# Patient Record
Sex: Female | Born: 1980 | Race: White | Hispanic: No | Marital: Married | Smoking: Former smoker
Health system: Southern US, Community
[De-identification: ages and names within clinical notes are randomized; demographics above are authoritative.]

## PROBLEM LIST (undated history)

## (undated) DIAGNOSIS — Z1371 Encounter for nonprocreative screening for genetic disease carrier status: Secondary | ICD-10-CM

## (undated) DIAGNOSIS — F32A Depression, unspecified: Secondary | ICD-10-CM

## (undated) DIAGNOSIS — D649 Anemia, unspecified: Secondary | ICD-10-CM

## (undated) DIAGNOSIS — K219 Gastro-esophageal reflux disease without esophagitis: Secondary | ICD-10-CM

## (undated) DIAGNOSIS — Z1379 Encounter for other screening for genetic and chromosomal anomalies: Secondary | ICD-10-CM

## (undated) DIAGNOSIS — I10 Essential (primary) hypertension: Secondary | ICD-10-CM

## (undated) DIAGNOSIS — N920 Excessive and frequent menstruation with regular cycle: Secondary | ICD-10-CM

## (undated) DIAGNOSIS — Z8041 Family history of malignant neoplasm of ovary: Secondary | ICD-10-CM

## (undated) DIAGNOSIS — R519 Headache, unspecified: Secondary | ICD-10-CM

## (undated) HISTORY — DX: Encounter for other screening for genetic and chromosomal anomalies: Z13.79

## (undated) HISTORY — DX: Encounter for nonprocreative screening for genetic disease carrier status: Z13.71

## (undated) HISTORY — PX: ABDOMINAL HYSTERECTOMY: SHX81

## (undated) HISTORY — DX: Gastro-esophageal reflux disease without esophagitis: K21.9

## (undated) HISTORY — PX: BREAST SURGERY: SHX581

## (undated) HISTORY — DX: Essential (primary) hypertension: I10

## (undated) HISTORY — DX: Family history of malignant neoplasm of ovary: Z80.41

## (undated) HISTORY — PX: ABDOMINOPLASTY: SUR9

## (undated) HISTORY — DX: Excessive and frequent menstruation with regular cycle: N92.0

---

## 2005-03-03 HISTORY — PX: AUGMENTATION MAMMAPLASTY: SUR837

## 2005-03-03 HISTORY — PX: ABDOMINOPLASTY: SUR9

## 2013-07-11 DIAGNOSIS — R002 Palpitations: Secondary | ICD-10-CM | POA: Insufficient documentation

## 2013-07-11 DIAGNOSIS — O1490 Unspecified pre-eclampsia, unspecified trimester: Secondary | ICD-10-CM | POA: Insufficient documentation

## 2013-07-11 DIAGNOSIS — I10 Essential (primary) hypertension: Secondary | ICD-10-CM | POA: Insufficient documentation

## 2014-08-22 DIAGNOSIS — D509 Iron deficiency anemia, unspecified: Secondary | ICD-10-CM | POA: Insufficient documentation

## 2014-10-02 DIAGNOSIS — Z1371 Encounter for nonprocreative screening for genetic disease carrier status: Secondary | ICD-10-CM

## 2014-10-02 DIAGNOSIS — Z1379 Encounter for other screening for genetic and chromosomal anomalies: Secondary | ICD-10-CM

## 2014-10-02 HISTORY — DX: Encounter for nonprocreative screening for genetic disease carrier status: Z13.71

## 2014-10-02 HISTORY — DX: Encounter for other screening for genetic and chromosomal anomalies: Z13.79

## 2015-01-03 ENCOUNTER — Encounter
Admission: RE | Admit: 2015-01-03 | Discharge: 2015-01-03 | Disposition: A | Discharge status: Home / Self Care | Payer: 59 | Source: Ambulatory Visit | Attending: Obstetrics and Gynecology | Admitting: Obstetrics and Gynecology

## 2015-01-03 DIAGNOSIS — Z01818 Encounter for other preprocedural examination: Secondary | ICD-10-CM | POA: Diagnosis not present

## 2015-01-03 DIAGNOSIS — N920 Excessive and frequent menstruation with regular cycle: Secondary | ICD-10-CM | POA: Diagnosis not present

## 2015-01-03 DIAGNOSIS — N946 Dysmenorrhea, unspecified: Secondary | ICD-10-CM | POA: Insufficient documentation

## 2015-01-03 HISTORY — DX: Anemia, unspecified: D64.9

## 2015-01-03 LAB — BASIC METABOLIC PANEL
Anion gap: 7 (ref 5–15)
BUN: 11 mg/dL (ref 6–20)
CO2: 27 mmol/L (ref 22–32)
Calcium: 9 mg/dL (ref 8.9–10.3)
Chloride: 103 mmol/L (ref 101–111)
Creatinine, Ser: 0.67 mg/dL (ref 0.44–1.00)
GFR calc Af Amer: >60 mL/min (ref >60)
GFR calc non Af Amer: >60 mL/min (ref >60)
Glucose, Bld: 81 mg/dL (ref 65–99)
Potassium: 4.3 mmol/L (ref 3.5–5.1)
Sodium: 137 mmol/L (ref 135–145)

## 2015-01-03 LAB — CBC
HCT: 36 % (ref 35.0–47.0)
Hemoglobin: 11.9 g/dL — ABNORMAL LOW (ref 12.0–16.0)
MCH: 30.7 pg (ref 26.0–34.0)
MCHC: 33.2 g/dL (ref 32.0–36.0)
MCV: 92.5 fL (ref 80.0–100.0)
Platelets: 236 10*3/uL (ref 150–440)
RBC: 3.89 MIL/uL (ref 3.80–5.20)
RDW: 16.3 % — ABNORMAL HIGH (ref 11.5–14.5)
WBC: 5.7 10*3/uL (ref 3.6–11.0)

## 2015-01-03 LAB — TYPE AND SCREEN
ABO/RH(D): O NEG
Antibody Screen: NEGATIVE

## 2015-01-03 LAB — ABO/RH: ABO/RH(D): O NEG

## 2015-01-03 NOTE — Patient Instructions (Addendum)
  Your procedure is scheduled on:01/11/15 Report to Day Surgery.[facility]OND FLOOR To find out your arrival time please call [phone] between 1PM - 3PM on 01/10/15.  Remember: Instructions that are not followed completely may result in serious medical risk, up to and including death, or upon the discretion of your surgeon and anesthesiologist your surgery may need to be rescheduled.    _X___ 1. Do not eat food or drink liquids after midnight. No gum chewing or hard candies.     _X___ 2. No Alcohol for 24 hours before or after surgery.   ____ 3. Bring all medications with you on the day of surgery if instructed.    __X__ 4. Notify your doctor if there is any change in your medical condition     (cold, fever, infections).     Do not wear jewelry, make-up, hairpins, clips or nail polish.  Do not wear lotions, powders, or perfumes. You may wear deodorant.  Do not shave 48 hours prior to surgery. Men may shave face and neck.  Do not bring valuables to the hospital.    [facility] is not responsible for any belongings or valuables.               Contacts, dentures or bridgework may not be worn into surgery.  Leave your suitcase in the car. After surgery it may be brought to your room.  For patients admitted to the hospital, discharge time is determined by your                treatment team.   Patients discharged the day of surgery will not be allowed to drive home.   Please read over the following fact sheets that you were given:   Surgical Site Infection Prevention   ____ Take these medicines the morning of surgery with A SIP OF WATER:    1. ZANTAC  2.   3.   4.  5.  6.  ____ Fleet Enema (as directed)   _X___ Use CHG Soap as directed  ____ Use inhalers on the day of surgery  ____ Stop metformin 2 days prior to surgery    ____ Take 1/2 of usual insulin dose the night before surgery and none on the morning of surgery.   ____ Stop Coumadin/Plavix/aspirin on  ____  Stop Anti-inflammatories on    ____ Stop supplements until after surgery.    ____ Bring C-Pap to the hospital.

## 2015-01-11 ENCOUNTER — Observation Stay
Admission: RE | Admit: 2015-01-11 | Discharge: 2015-01-12 | Disposition: A | Discharge status: Home / Self Care | Payer: 59 | Source: Ambulatory Visit | Attending: Obstetrics and Gynecology | Admitting: Obstetrics and Gynecology

## 2015-01-11 ENCOUNTER — Ambulatory Visit: Payer: 59 | Admitting: Certified Registered Nurse Anesthetist

## 2015-01-11 ENCOUNTER — Encounter
Admission: RE | Disposition: A | Discharge status: Home / Self Care | Payer: Self-pay | Source: Ambulatory Visit | Attending: Obstetrics and Gynecology

## 2015-01-11 ENCOUNTER — Encounter: Payer: Self-pay | Admitting: *Deleted

## 2015-01-11 DIAGNOSIS — D649 Anemia, unspecified: Secondary | ICD-10-CM | POA: Insufficient documentation

## 2015-01-11 DIAGNOSIS — Z808 Family history of malignant neoplasm of other organs or systems: Secondary | ICD-10-CM | POA: Insufficient documentation

## 2015-01-11 DIAGNOSIS — N920 Excessive and frequent menstruation with regular cycle: Principal | ICD-10-CM | POA: Insufficient documentation

## 2015-01-11 DIAGNOSIS — N946 Dysmenorrhea, unspecified: Secondary | ICD-10-CM | POA: Diagnosis not present

## 2015-01-11 DIAGNOSIS — Z87891 Personal history of nicotine dependence: Secondary | ICD-10-CM | POA: Insufficient documentation

## 2015-01-11 DIAGNOSIS — K219 Gastro-esophageal reflux disease without esophagitis: Secondary | ICD-10-CM | POA: Insufficient documentation

## 2015-01-11 DIAGNOSIS — Z801 Family history of malignant neoplasm of trachea, bronchus and lung: Secondary | ICD-10-CM | POA: Insufficient documentation

## 2015-01-11 DIAGNOSIS — K660 Peritoneal adhesions (postprocedural) (postinfection): Secondary | ICD-10-CM | POA: Insufficient documentation

## 2015-01-11 DIAGNOSIS — N838 Other noninflammatory disorders of ovary, fallopian tube and broad ligament: Secondary | ICD-10-CM | POA: Diagnosis not present

## 2015-01-11 DIAGNOSIS — Z90710 Acquired absence of both cervix and uterus: Secondary | ICD-10-CM | POA: Diagnosis present

## 2015-01-11 DIAGNOSIS — Z8041 Family history of malignant neoplasm of ovary: Secondary | ICD-10-CM | POA: Diagnosis not present

## 2015-01-11 DIAGNOSIS — N888 Other specified noninflammatory disorders of cervix uteri: Secondary | ICD-10-CM | POA: Diagnosis not present

## 2015-01-11 DIAGNOSIS — Z803 Family history of malignant neoplasm of breast: Secondary | ICD-10-CM | POA: Insufficient documentation

## 2015-01-11 DIAGNOSIS — Z79899 Other long term (current) drug therapy: Secondary | ICD-10-CM | POA: Insufficient documentation

## 2015-01-11 HISTORY — PX: LAPAROSCOPIC HYSTERECTOMY: SHX1926

## 2015-01-11 HISTORY — PX: LAPAROSCOPIC BILATERAL SALPINGECTOMY: SHX5889

## 2015-01-11 HISTORY — PX: CYSTOSCOPY: SHX5120

## 2015-01-11 LAB — POCT PREGNANCY, URINE: Preg Test, Ur: NEGATIVE

## 2015-01-11 SURGERY — HYSTERECTOMY, TOTAL, LAPAROSCOPIC
Anesthesia: General

## 2015-01-11 MED ORDER — LACTATED RINGERS IV SOLN
INTRAVENOUS | Status: DC
  Administered 2015-01-11 (×2): via INTRAVENOUS

## 2015-01-11 MED ORDER — NEOSTIGMINE METHYLSULFATE 10 MG/10ML IV SOLN
INTRAVENOUS | Status: DC | PRN
  Administered 2015-01-11: 3 mg via INTRAVENOUS

## 2015-01-11 MED ORDER — SODIUM CHLORIDE 0.9 % IJ SOLN: 9.0000 mL | INTRAMUSCULAR | Status: DC | PRN

## 2015-01-11 MED ORDER — HYDROMORPHONE HCL 1 MG/ML IJ SOLN
0.2500 mg | INTRAMUSCULAR | Status: DC | PRN
  Administered 2015-01-11 (×2): 0.5 mg via INTRAVENOUS

## 2015-01-11 MED ORDER — DEXAMETHASONE SODIUM PHOSPHATE 4 MG/ML IJ SOLN
INTRAMUSCULAR | Status: DC | PRN
  Administered 2015-01-11: 5 mg via INTRAVENOUS

## 2015-01-11 MED ORDER — MIDAZOLAM HCL 2 MG/2ML IJ SOLN
INTRAMUSCULAR | Status: DC | PRN
  Administered 2015-01-11: 2 mg via INTRAVENOUS

## 2015-01-11 MED ORDER — PANTOPRAZOLE SODIUM 40 MG IV SOLR
40.0000 mg | Freq: Every day | INTRAVENOUS | Status: DC
  Administered 2015-01-11: 40 mg via INTRAVENOUS
  Filled 2015-01-11 (×2): qty 40

## 2015-01-11 MED ORDER — KETOROLAC TROMETHAMINE 30 MG/ML IJ SOLN
30.0000 mg | Freq: Four times a day (QID) | INTRAMUSCULAR | Status: DC
Start: 2015-01-11 — End: 2015-01-12
  Administered 2015-01-11 – 2015-01-12 (×3): 30 mg via INTRAVENOUS
  Filled 2015-01-11 (×3): qty 1

## 2015-01-11 MED ORDER — GLYCOPYRROLATE 0.2 MG/ML IJ SOLN
INTRAMUSCULAR | Status: DC | PRN
  Administered 2015-01-11: 0.4 mg via INTRAVENOUS

## 2015-01-11 MED ORDER — LIDOCAINE HCL (CARDIAC) 20 MG/ML IV SOLN
INTRAVENOUS | Status: DC | PRN
  Administered 2015-01-11: 100 mg via INTRAVENOUS

## 2015-01-11 MED ORDER — ONDANSETRON HCL 4 MG/2ML IJ SOLN
INTRAMUSCULAR | Status: DC | PRN
  Administered 2015-01-11: 4 mg via INTRAVENOUS

## 2015-01-11 MED ORDER — ONDANSETRON HCL 4 MG/2ML IJ SOLN
4.0000 mg | Freq: Four times a day (QID) | INTRAMUSCULAR | Status: DC | PRN
  Administered 2015-01-11 – 2015-01-12 (×2): 4 mg via INTRAVENOUS
  Filled 2015-01-11 (×3): qty 2

## 2015-01-11 MED ORDER — CEFAZOLIN SODIUM-DEXTROSE 2-3 GM-% IV SOLR
INTRAVENOUS | Status: AC
  Administered 2015-01-11: 2 g via INTRAVENOUS
  Filled 2015-01-11: qty 50

## 2015-01-11 MED ORDER — PROPOFOL 10 MG/ML IV BOLUS
INTRAVENOUS | Status: DC | PRN
  Administered 2015-01-11: 150 mg via INTRAVENOUS

## 2015-01-11 MED ORDER — ROCURONIUM BROMIDE 100 MG/10ML IV SOLN
INTRAVENOUS | Status: DC | PRN
  Administered 2015-01-11: 5 mg via INTRAVENOUS
  Administered 2015-01-11 (×3): 10 mg via INTRAVENOUS

## 2015-01-11 MED ORDER — KETOROLAC TROMETHAMINE 30 MG/ML IJ SOLN
30.0000 mg | Freq: Four times a day (QID) | INTRAMUSCULAR | Status: DC
Start: 2015-01-11 — End: 2015-01-12

## 2015-01-11 MED ORDER — DIPHENHYDRAMINE HCL 12.5 MG/5ML PO ELIX: 12.5000 mg | ORAL_SOLUTION | Freq: Four times a day (QID) | ORAL | Status: DC | PRN

## 2015-01-11 MED ORDER — FENTANYL CITRATE (PF) 100 MCG/2ML IJ SOLN
INTRAMUSCULAR | Status: AC
  Administered 2015-01-11: 25 ug via INTRAVENOUS
  Filled 2015-01-11: qty 2

## 2015-01-11 MED ORDER — BUPIVACAINE HCL (PF) 0.5 % IJ SOLN
INTRAMUSCULAR | Status: AC
  Filled 2015-01-11: qty 30

## 2015-01-11 MED ORDER — BUPIVACAINE HCL 0.5 % IJ SOLN
INTRAMUSCULAR | Status: DC | PRN
  Administered 2015-01-11: 21 mL

## 2015-01-11 MED ORDER — FENTANYL CITRATE (PF) 100 MCG/2ML IJ SOLN
25.0000 ug | INTRAMUSCULAR | Status: DC | PRN
  Administered 2015-01-11 (×4): 25 ug via INTRAVENOUS

## 2015-01-11 MED ORDER — MENTHOL 3 MG MT LOZG
1.0000 | LOZENGE | OROMUCOSAL | Status: DC | PRN
  Filled 2015-01-11: qty 9

## 2015-01-11 MED ORDER — HYDROMORPHONE HCL 1 MG/ML IJ SOLN
INTRAMUSCULAR | Status: AC
  Administered 2015-01-11: 0.5 mg via INTRAVENOUS
  Filled 2015-01-11: qty 1

## 2015-01-11 MED ORDER — LABETALOL HCL 5 MG/ML IV SOLN
INTRAVENOUS | Status: DC | PRN
  Administered 2015-01-11: 5 mg via INTRAVENOUS

## 2015-01-11 MED ORDER — OXYCODONE HCL 5 MG PO TABS: 5.0000 mg | ORAL_TABLET | Freq: Once | ORAL | Status: DC | PRN

## 2015-01-11 MED ORDER — DEXTROSE-NACL 5-0.45 % IV SOLN
INTRAVENOUS | Status: DC
  Administered 2015-01-11 – 2015-01-12 (×2): via INTRAVENOUS

## 2015-01-11 MED ORDER — NALOXONE HCL 0.4 MG/ML IJ SOLN: 0.4000 mg | INTRAMUSCULAR | Status: DC | PRN

## 2015-01-11 MED ORDER — CEFAZOLIN SODIUM-DEXTROSE 2-3 GM-% IV SOLR
2.0000 g | INTRAVENOUS | Status: AC
  Administered 2015-01-11: 2 g via INTRAVENOUS

## 2015-01-11 MED ORDER — ONDANSETRON HCL 4 MG PO TABS: 4.0000 mg | ORAL_TABLET | Freq: Four times a day (QID) | ORAL | Status: DC | PRN

## 2015-01-11 MED ORDER — ACETAMINOPHEN 10 MG/ML IV SOLN
INTRAVENOUS | Status: AC
  Filled 2015-01-11: qty 100

## 2015-01-11 MED ORDER — SIMETHICONE 80 MG PO CHEW: 80.0000 mg | CHEWABLE_TABLET | Freq: Four times a day (QID) | ORAL | Status: DC | PRN

## 2015-01-11 MED ORDER — PROMETHAZINE HCL 25 MG/ML IJ SOLN
12.5000 mg | Freq: Four times a day (QID) | INTRAMUSCULAR | Status: DC | PRN
  Administered 2015-01-11: 12.5 mg via INTRAMUSCULAR
  Filled 2015-01-11: qty 1

## 2015-01-11 MED ORDER — MORPHINE SULFATE 2 MG/ML IV SOLN
INTRAVENOUS | Status: DC
  Administered 2015-01-11: 16:00:00 via INTRAVENOUS
  Administered 2015-01-11 – 2015-01-12 (×3): 2 mg via INTRAVENOUS
  Filled 2015-01-11: qty 25

## 2015-01-11 MED ORDER — OXYCODONE HCL 5 MG/5ML PO SOLN: 5.0000 mg | Freq: Once | ORAL | Status: DC | PRN

## 2015-01-11 MED ORDER — SUCCINYLCHOLINE CHLORIDE 20 MG/ML IJ SOLN
INTRAMUSCULAR | Status: DC | PRN
  Administered 2015-01-11: 120 mg via INTRAVENOUS

## 2015-01-11 MED ORDER — FENTANYL CITRATE (PF) 100 MCG/2ML IJ SOLN
INTRAMUSCULAR | Status: DC | PRN
  Administered 2015-01-11: 100 ug via INTRAVENOUS
  Administered 2015-01-11 (×5): 50 ug via INTRAVENOUS

## 2015-01-11 MED ORDER — ACETAMINOPHEN 10 MG/ML IV SOLN
INTRAVENOUS | Status: DC | PRN
Start: 2015-01-11 — End: 2015-01-11
  Administered 2015-01-11: 1000 mg via INTRAVENOUS

## 2015-01-11 MED ORDER — DIPHENHYDRAMINE HCL 50 MG/ML IJ SOLN: 12.5000 mg | Freq: Four times a day (QID) | INTRAMUSCULAR | Status: DC | PRN

## 2015-01-11 SURGICAL SUPPLY — 42 items
BAG URO DRAIN 2000ML W/SPOUT (MISCELLANEOUS) ×5 IMPLANT
BLADE SURG SZ11 CARB STEEL (BLADE) ×5 IMPLANT
CANISTER SUCT 1200ML W/VALVE (MISCELLANEOUS) ×5 IMPLANT
CATH FOLEY 2WAY  5CC 16FR (CATHETERS) ×2
CATH URTH 16FR FL 2W BLN LF (CATHETERS) ×3 IMPLANT
CHLORAPREP W/TINT 26ML (MISCELLANEOUS) ×5 IMPLANT
COUNTER NEEDLE 20/40 LG (NEEDLE) ×5 IMPLANT
FILTER LAP SMOKE EVAC STRL (MISCELLANEOUS) IMPLANT
GLOVE BIO SURGEON STRL SZ7 (GLOVE) ×20 IMPLANT
GLOVE INDICATOR 7.5 STRL GRN (GLOVE) ×20 IMPLANT
GOWN STRL REUS W/ TWL LRG LVL3 (GOWN DISPOSABLE) ×6 IMPLANT
GOWN STRL REUS W/ TWL XL LVL3 (GOWN DISPOSABLE) ×3 IMPLANT
GOWN STRL REUS W/TWL LRG LVL3 (GOWN DISPOSABLE) ×4
GOWN STRL REUS W/TWL XL LVL3 (GOWN DISPOSABLE) ×2
IRRIGATION STRYKERFLOW (MISCELLANEOUS) ×3 IMPLANT
IRRIGATOR STRYKERFLOW (MISCELLANEOUS) ×5
IV LACTATED RINGERS 1000ML (IV SOLUTION) ×5 IMPLANT
IV NS 1000ML (IV SOLUTION) ×2
IV NS 1000ML BAXH (IV SOLUTION) ×3 IMPLANT
LABEL OR SOLS (LABEL) ×5 IMPLANT
LIQUID BAND (GAUZE/BANDAGES/DRESSINGS) ×5 IMPLANT
MANIPULATOR VCARE LG CRV RETR (MISCELLANEOUS) ×5 IMPLANT
MANIPULATOR VCARE STD CRV RETR (MISCELLANEOUS) IMPLANT
NS IRRIG 500ML POUR BTL (IV SOLUTION) ×5 IMPLANT
OCCLUDER COLPOPNEUMO (BALLOONS) ×5 IMPLANT
PACK GYN LAPAROSCOPIC (MISCELLANEOUS) ×5 IMPLANT
PAD OB MATERNITY 4.3X12.25 (PERSONAL CARE ITEMS) ×5 IMPLANT
PAD PREP 24X41 OB/GYN DISP (PERSONAL CARE ITEMS) ×5 IMPLANT
SET CYSTO W/LG BORE CLAMP LF (SET/KITS/TRAYS/PACK) ×5 IMPLANT
SHEARS HARMONIC ACE PLUS 36CM (ENDOMECHANICALS) ×5 IMPLANT
SLEEVE ENDOPATH XCEL 5M (ENDOMECHANICALS) ×10 IMPLANT
SPONGE LAP 18X18 5 PK (GAUZE/BANDAGES/DRESSINGS) ×5 IMPLANT
STRAP SAFETY BODY (MISCELLANEOUS) ×5 IMPLANT
SUT VIC AB 0 CT1 27 (SUTURE)
SUT VIC AB 0 CT1 27XCR 8 STRN (SUTURE) IMPLANT
SUT VIC AB 0 CT1 36 (SUTURE) ×15 IMPLANT
SUT VIC AB 2-0 UR6 27 (SUTURE) IMPLANT
SUT VIC AB 4-0 FS2 27 (SUTURE) IMPLANT
SYR 50ML LL SCALE MARK (SYRINGE) ×5 IMPLANT
SYRINGE 10CC LL (SYRINGE) ×5 IMPLANT
TROCAR XCEL NON-BLD 5MMX100MML (ENDOMECHANICALS) ×5 IMPLANT
TUBING INSUFFLATOR HEATED (MISCELLANEOUS) ×5 IMPLANT

## 2015-01-11 NOTE — Op Note (Signed)
Preoperative Diagnosis: 1) 34 y.o.  with dysmenorrhea and menorrhagia  Postoperative Diagnosis: 1) 34 y.o.  with dysmenorrhea and menorrhagia  Operation Performed: Total laparoscopic hysterectomy, bilateral salpingectomy, and lysis of adhesions  Indication: 34 y.o. with menorrhagia to anemia desiring definitive surgical management  Anesthesia: General  Preoperative Antibiotics: 2g ancef  Estimated Blood Loss: 30mL  IV Fluids: 86mL crystaloid  Urine Output:: 480mL  Drains or Tubes: foley to gravity drainage  Implants: none  Specimens Removed: uterus, cervix, and fallopian tubes  Complications: none  Intraoperative Findings: Normal tubes, ovaries, and uterus.  Small omental adhesion at umbilicus  Patient Condition: stable  Procedure in Detail:  Patient was taken to the operating room where she was administered general anesthesia.  She was positioned in the dorsal lithotomy position utilizing Allen stirups, prepped and draped in the usual sterile fashion.  Prior to proceeding with procedure a time out was performed.  Attention was turned to the patient's pelvis.  An indwelling foley catheter was used to empty the patient's bladder.  An operative speculum was placed to allow visualization of the cervix.  The anterior lip of the cervix was grasped with a single tooth tenaculum, uterus sounded to 9cm, anteverted, non-enlarged.  A V-care was placed to allow manipulation of the uterus.  The operative speculum and single tooth tenaculum were then removed.  Attention was turned to the patient's abdomen.  The umbilicus was infiltrated with 1% Sensorcaine, before making a stab incision using an 11 blade scalpel.  A 95mm Excel trocar was then used to gain direct entry into the peritoneal cavity utilizing the camera to visualize progress of the trocar during placement.  Once peritoneal entry had been achieved, insufflation was started and pneumoperitoneum established at a pressure of 61mmHg.    General inspection of the abdomen revealed the above noted findings, ureters were visualized bilaterally.  Two lateral lower quadrant 1mm Excel ports (right and left) were placed under direct visualization.  The right tube was identified and grasped at its fimbriated end then excised from it attachment to the ovary and mesosalpinx using a 54mm harmonic.  The round ligament and utero-ovarian ligaments were then transected using the harmonic.    The anterior leaf of the broad was dissected down to the level of the internal cervical os and a bladder flap was started.  The posterior leaf of the broad was taken down to the level of the right uterosacral ligament.  The uterine artery was skeletonize, transected while applying good cephalad pressure on the V-care device.  The same steps were then repeated on the patient's left.  The V-care was identified on the posterior aspect of the uterus and the colpotomy was started between the uterosacral ligaments then carried around counter clockwise until the specimen was freed of all attachments.  The uterus was removed vaginally.  The vaginal cuff was then closed using interupted 0 Vicryl figure of eight stitches.  The vaginal cuff was noted to be hemostatic at the end of the closure.  The pelvis was irrigated all pedicles were noted to be hemostatic.  Pneumoperitoneum was evacuated.  The trocars were removed.  The 46mm trocar site was closed with 4-0 Monocryl in a subcuticular fashion.  All trocar sites were then dressed with surgical skin glue.  Cystoscopy was performed with brisk efflux of urine from both UO's and an intact bladder dome.  The foley catheter was replaced.  Sponge needle and instrument counts were correct time two.  The patient tolerated the procedure well  and was taken to the recovery room in stable condition.

## 2015-01-11 NOTE — Anesthesia Postprocedure Evaluation (Signed)
  Anesthesia Post-op Note  Patient: Denise Bolton  Procedure(s) Performed: Procedure(s): HYSTERECTOMY TOTAL LAPAROSCOPIC (N/A) LAPAROSCOPIC BILATERAL SALPINGECTOMY (Bilateral) CYSTOSCOPY  Anesthesia type:General ETT  Patient location: PACU  Post pain: Pain level controlled  Post assessment: Post-op Vital signs reviewed, Patient's Cardiovascular Status Stable, Respiratory Function Stable, Patent Airway and No signs of Nausea or vomiting  Post vital signs: Reviewed and stable  Last Vitals:  Filed Vitals:   01/11/15 1517  BP: 150/88  Pulse: 56  Temp: 36.8 C  Resp: 18    Level of consciousness: awake, alert  and patient cooperative  Complications: No apparent anesthesia complications

## 2015-01-11 NOTE — H&P (Signed)
Date of Initial paper H&P: 01/03/2015  History reviewed, patient examined, no change in status, stable for surgery.

## 2015-01-11 NOTE — Transfer of Care (Signed)
Immediate Anesthesia Transfer of Care Note  Patient: Denise Bolton  Procedure(s) Performed: Procedure(s): HYSTERECTOMY TOTAL LAPAROSCOPIC (N/A) LAPAROSCOPIC BILATERAL SALPINGECTOMY (Bilateral) CYSTOSCOPY  Patient Location:  Pacu  Anesthesia Type:General  Level of Consciousness: awake and alert   Airway & Oxygen Therapy: Patient Spontanous Breathing and Patient connected to nasal cannula oxygen  Post-op Assessment: Report given to RN and Post -op Vital signs reviewed and stable  Post vital signs: Reviewed and stable  Last Vitals:  Filed Vitals:   01/11/15 0938  BP: 145/103  Pulse: 81  Temp: 36.6 C  Resp: 16    Complications: No apparent anesthesia complications

## 2015-01-11 NOTE — Progress Notes (Signed)
Talked to Dr. Georgianne Fick to report elevated BP since admission to the unit.  He stated he is still in the hospital and will be coming to see patient in a little while, and that she has had normal BPs in clinic so he is ok with her BP for now.  He is also going to speak to her about possibly taking down the PCA and going to oral pain meds as well as taking out the foley catheter.

## 2015-01-11 NOTE — Anesthesia Preprocedure Evaluation (Signed)
Anesthesia Evaluation  Patient identified by MRN, date of birth, ID band Patient awake    Reviewed: Allergy & Precautions, H&P , NPO status , Patient's Chart, lab work & pertinent test results  History of Anesthesia Complications Negative for: history of anesthetic complications  Airway Mallampati: II  TM Distance: >3 FB Neck ROM: full    Dental no notable dental hx. (+) Teeth Intact   Pulmonary neg shortness of breath, former smoker,    Pulmonary exam normal breath sounds clear to auscultation       Cardiovascular Exercise Tolerance: Good (-) angina(-) Past MI and (-) DOE negative cardio ROS Normal cardiovascular exam Rhythm:regular Rate:Normal     Neuro/Psych negative neurological ROS  negative psych ROS   GI/Hepatic Neg liver ROS, GERD  Medicated and Controlled,  Endo/Other  negative endocrine ROS  Renal/GU negative Renal ROS  negative genitourinary   Musculoskeletal   Abdominal   Peds  Hematology  (+) Blood dyscrasia, anemia ,   Anesthesia Other Findings Past Medical History:   Anemia                                                      Past Surgical History:   CESAREAN SECTION                                                Comment:x 2   ABDOMINOPLASTY                                                BREAST SURGERY                                                  Comment:augmentation  BMI    Body Mass Index   27.95 kg/m 2      Reproductive/Obstetrics negative OB ROS                             Anesthesia Physical Anesthesia Plan  ASA: III  Anesthesia Plan: General ETT   Post-op Pain Management:    Induction:   Airway Management Planned:   Additional Equipment:   Intra-op Plan:   Post-operative Plan:   Informed Consent: I have reviewed the patients History and Physical, chart, labs and discussed the procedure including the risks, benefits and alternatives for  the proposed anesthesia with the patient or authorized representative who has indicated his/her understanding and acceptance.   Dental Advisory Given  Plan Discussed with: Anesthesiologist, CRNA and Surgeon  Anesthesia Plan Comments:         Anesthesia Quick Evaluation

## 2015-01-11 NOTE — Progress Notes (Signed)
Obstetric and Gynecology  Subjective  Some nausea postoperatively, pain adequately controlled.  Objective   Filed Vitals:   01/11/15 1732  BP: 144/95  Pulse: 69  Temp: 98.1 F (36.7 C)  Resp: 14     Intake/Output Summary (Last 24 hours) at 01/11/15 1905 Last data filed at 01/11/15 1800  Gross per 24 hour  Intake   1050 ml  Output   1250 ml  Net   -200 ml    General: NAD Abdomen: soft, non-tender, non-distended, trocar incision D/C/I dressings Extremities: no edema, SCD's  Labs: Results for orders placed or performed during the hospital encounter of 01/11/15 (from the past 24 hour(s))  Pregnancy, urine POC     Status: None   Collection Time: 01/11/15  9:44 AM  Result Value Ref Range   Preg Test, Ur NEGATIVE NEGATIVE    Assessment   34 y.o. POD#0 TLH, BS, and cystoscopy  Plan    1) Continue routine postoperative care - Con't PCA and foley for now given nausea - prn zofran, will add prn IM phenergan for nausea - repeat CBC and BMP tomorrow AM - Discussed normal operative findings with patient

## 2015-01-11 NOTE — Anesthesia Procedure Notes (Signed)
Procedure Name: Intubation Date/Time: 01/11/2015 11:43 AM Performed by: Johnna Acosta Pre-anesthesia Checklist: Patient identified, Emergency Drugs available, Suction available and Patient being monitored Patient Re-evaluated:Patient Re-evaluated prior to inductionOxygen Delivery Method: Circle system utilized Preoxygenation: Pre-oxygenation with 100% oxygen Intubation Type: IV induction Ventilation: Mask ventilation without difficulty Laryngoscope Size: Miller and 2 Grade View: Grade I Tube type: Oral Tube size: 7.0 mm Number of attempts: 1 Placement Confirmation: ETT inserted through vocal cords under direct vision and positive ETCO2 Secured at: 21 cm Tube secured with: Tape Dental Injury: Teeth and Oropharynx as per pre-operative assessment

## 2015-01-12 ENCOUNTER — Encounter: Payer: Self-pay | Admitting: Obstetrics and Gynecology

## 2015-01-12 DIAGNOSIS — N920 Excessive and frequent menstruation with regular cycle: Secondary | ICD-10-CM | POA: Diagnosis not present

## 2015-01-12 LAB — CBC
HCT: 31.7 % — ABNORMAL LOW (ref 35.0–47.0)
Hemoglobin: 10.4 g/dL — ABNORMAL LOW (ref 12.0–16.0)
MCH: 30.2 pg (ref 26.0–34.0)
MCHC: 32.9 g/dL (ref 32.0–36.0)
MCV: 91.9 fL (ref 80.0–100.0)
Platelets: 243 10*3/uL (ref 150–440)
RBC: 3.45 MIL/uL — ABNORMAL LOW (ref 3.80–5.20)
RDW: 16.1 % — ABNORMAL HIGH (ref 11.5–14.5)
WBC: 7.2 10*3/uL (ref 3.6–11.0)

## 2015-01-12 LAB — BASIC METABOLIC PANEL
Anion gap: 5 (ref 5–15)
BUN: 7 mg/dL (ref 6–20)
CO2: 27 mmol/L (ref 22–32)
Calcium: 8.3 mg/dL — ABNORMAL LOW (ref 8.9–10.3)
Chloride: 107 mmol/L (ref 101–111)
Creatinine, Ser: 0.53 mg/dL (ref 0.44–1.00)
GFR calc Af Amer: >60 mL/min (ref >60)
GFR calc non Af Amer: >60 mL/min (ref >60)
Glucose, Bld: 113 mg/dL — ABNORMAL HIGH (ref 65–99)
Potassium: 3.9 mmol/L (ref 3.5–5.1)
Sodium: 139 mmol/L (ref 135–145)

## 2015-01-12 MED ORDER — IBUPROFEN 600 MG PO TABS
600.0000 mg | ORAL_TABLET | Freq: Four times a day (QID) | ORAL | Status: DC | PRN
Start: 2015-01-12 — End: 2015-01-12
  Administered 2015-01-12: 600 mg via ORAL
  Filled 2015-01-12: qty 1

## 2015-01-12 MED ORDER — DOCUSATE SODIUM 100 MG PO CAPS
100.0000 mg | ORAL_CAPSULE | Freq: Two times a day (BID) | ORAL | Status: DC
  Administered 2015-01-12: 100 mg via ORAL
  Filled 2015-01-12: qty 1

## 2015-01-12 MED ORDER — IBUPROFEN 600 MG PO TABS: 600.0000 mg | ORAL_TABLET | Freq: Four times a day (QID) | ORAL | Status: DC | PRN

## 2015-01-12 MED ORDER — OXYCODONE-ACETAMINOPHEN 5-325 MG PO TABS
1.0000 | ORAL_TABLET | Freq: Four times a day (QID) | ORAL | Status: DC | PRN
  Administered 2015-01-12: 2 via ORAL
  Filled 2015-01-12: qty 2

## 2015-01-12 MED ORDER — OXYCODONE-ACETAMINOPHEN 5-325 MG PO TABS: 1.0000 | ORAL_TABLET | Freq: Four times a day (QID) | ORAL | Status: DC | PRN

## 2015-01-12 MED ORDER — FAMOTIDINE 20 MG PO TABS
20.0000 mg | ORAL_TABLET | Freq: Every day | ORAL | Status: DC
  Administered 2015-01-12: 20 mg via ORAL
  Filled 2015-01-12: qty 1

## 2015-01-12 NOTE — Progress Notes (Signed)
All discharge instructions given to patient and she voices understanding of all instructions given. She will keep her already scheduled d/c appt. Iv d/c'd pt tolerated well, site benign. Prescriptions given.  Patient discharged home with spouse escorted out by auxillary

## 2015-01-12 NOTE — Discharge Instructions (Signed)
Follow up sooner fever greater than 100.5, problems breathing or pain not helped by medication, severe bleeding (saturating more than one pad an hour or large palm sized clots), or severe depression No driving while taking narcotics, No heavy lifting x 6 wks. Follow up sooner if you have any incisional concerns such as increased redness, swelling, discharge or increase in pain at incision site. No douches, intercourse , tampons, or enemas for 6 weeks.  °

## 2015-01-12 NOTE — Discharge Summary (Signed)
Physician Discharge Summary  Patient ID: Denise Bolton MRN: [id number] DOB/AGE: [birth year] 34 y.o.  Admit date: 01/11/2015 Discharge date: 01/12/2015  Admission Diagnoses: menorrhagia and dysmenorrhea  Discharge Diagnoses:  Active Problems:   S/P laparoscopic hysterectomy   Discharged Condition: good  Hospital Course: Patient underwent uncomplicated TLH, BS, and cystoscopy.  Some problems with nausea immediately postoperatively.  Improved overnight.  Patient meeting all postoperative goals including ambulating, tolerating general diet, voiding, and reporting adequate pain control on po analgesics.  Patient was afebrile and hemodynamically stable throughout admission.  Her postoperative labs revealed normal white count, stable H&H, and normal BUN/Cr without electrolyte abnormalities.  Consults: None  Significant Diagnostic Studies: Results for orders placed or performed during the hospital encounter of 01/11/15 (from the past 48 hour(s))  Pregnancy, urine POC     Status: None   Collection Time: 01/11/15  9:44 AM  Result Value Ref Range   Preg Test, Ur NEGATIVE NEGATIVE    Comment:        THE SENSITIVITY OF THIS METHODOLOGY IS >24 mIU/mL   Basic metabolic panel     Status: Abnormal   Collection Time: 01/12/15  6:26 AM  Result Value Ref Range   Sodium 139 135 - 145 mmol/L   Potassium 3.9 3.5 - 5.1 mmol/L   Chloride 107 101 - 111 mmol/L   CO2 27 22 - 32 mmol/L   Glucose, Bld 113 (H) 65 - 99 mg/dL   BUN 7 6 - 20 mg/dL   Creatinine, Ser 0.53 0.44 - 1.00 mg/dL   Calcium 8.3 (L) 8.9 - 10.3 mg/dL   GFR calc non Af Amer >60 >60 mL/min   GFR calc Af Amer >60 >60 mL/min    Comment: (NOTE) The eGFR has been calculated using the CKD EPI equation. This calculation has not been validated in all clinical situations. eGFR's persistently <60 mL/min signify possible Chronic Kidney Disease.    Anion gap 5 5 - 15  CBC     Status: Abnormal   Collection Time: 01/12/15  6:26 AM  Result  Value Ref Range   WBC 7.2 3.6 - 11.0 K/uL   RBC 3.45 (L) 3.80 - 5.20 MIL/uL   Hemoglobin 10.4 (L) 12.0 - 16.0 g/dL   HCT 31.7 (L) 35.0 - 47.0 %   MCV 91.9 80.0 - 100.0 fL   MCH 30.2 26.0 - 34.0 pg   MCHC 32.9 32.0 - 36.0 g/dL   RDW 16.1 (H) 11.5 - 14.5 %   Platelets 243 150 - 440 K/uL    Discharge Exam: Blood pressure 136/70, pulse 62, temperature 98.2 F (36.8 C), temperature source Oral, resp. rate 18, height _0  (1.651 m), weight 76.204 kg (168 lb), last menstrual period 01/08/2015, SpO2 99 %. General appearance: alert, appears stated age and no distress Resp: normal work of breathing GI: soft, non-tender; bowel sounds normal; no masses,  no organomegaly and incisions D/C/I  Extremities: extremities normal, atraumatic, no cyanosis or edema and SCD's in place  Disposition: Final discharge disposition not confirmed  Discharge Instructions    Activity as tolerated    Complete by:  As directed      Diet - low sodium heart healthy    Complete by:  As directed      Driving restriction     Complete by:  As directed   Avoid driving for at least 1 weeks or while taking narcotic pain medication.     Lifting restrictions    Complete by:  As  directed   Weight restriction of 10 lbs for 6 weeks     No dressing needed    Complete by:  As directed   No tampons, pads ok for any vaginal bleeding     Other restrictions (specify):    Complete by:  As directed   Showers ok no baths for first 6 weeks     Remove dressing in 24 hours    Complete by:  As directed      Sexual acrtivity    Complete by:  As directed   No intercourse for 6 weeks            Medication List    STOP taking these medications        ferrous fumarate 325 (106 FE) MG Tabs tablet  Commonly known as:  HEMOCYTE - 106 mg FE      TAKE these medications        buPROPion 300 MG 24 hr tablet  Commonly known as:  WELLBUTRIN XL  Take 300 mg by mouth daily.     ibuprofen 600 MG tablet  Commonly known as:   ADVIL,MOTRIN  Take 1 tablet (600 mg total) by mouth every 6 (six) hours as needed for fever or headache.     oxyCODONE-acetaminophen 5-325 MG tablet  Commonly known as:  PERCOCET/ROXICET  Take 1-2 tablets by mouth every 6 (six) hours as needed for moderate pain or severe pain.     ranitidine 75 MG tablet  Commonly known as:  ZANTAC  Take 75 mg by mouth every morning.           Follow-up Information    Follow up with Dorthula Nettles, MD In 1 week.   Specialty:  Obstetrics and Gynecology   Why:  incision check - [email or web address]   Contact information:   [address] [place] [address] [phone]       Signed: Dorthula Nettles 01/12/2015, 7:54 AM

## 2015-01-15 LAB — SURGICAL PATHOLOGY

## 2015-05-21 DIAGNOSIS — H5213 Myopia, bilateral: Secondary | ICD-10-CM | POA: Diagnosis not present

## 2015-12-10 ENCOUNTER — Encounter: Payer: Self-pay | Admitting: Physician Assistant

## 2015-12-10 ENCOUNTER — Ambulatory Visit: Payer: Self-pay | Admitting: Physician Assistant

## 2015-12-10 VITALS — BP 140/100 | HR 80 | Temp 98.3°F

## 2015-12-10 DIAGNOSIS — I10 Essential (primary) hypertension: Secondary | ICD-10-CM

## 2015-12-10 MED ORDER — HYDROCHLOROTHIAZIDE 25 MG PO TABS: 25.0000 mg | ORAL_TABLET | Freq: Every day | ORAL | 3 refills | Status: DC

## 2015-12-10 NOTE — Progress Notes (Signed)
S: c/o bp being elevated, several really high readings like 180/115, been running high for awhile in range of 130/90-100; is under a lot of stress, gained 20lbs this year, had partial hyst a few months ago, denies cp/sob/swelling in feet and ankles  O: vitals w elevated bp at 140/100L, 142/100R; other vitals wnl, nad, lungs c ta , cv rrr no mrg, no pedal edema noted  A: htn  P hctz 25mg , recheck bp at end of the week, f/u with DR Ronnald Ramp next Thursday for already scheduled appt

## 2015-12-14 ENCOUNTER — Ambulatory Visit: Payer: Self-pay | Admitting: Physician Assistant

## 2015-12-20 ENCOUNTER — Ambulatory Visit: Payer: Self-pay | Admitting: Family Medicine

## 2015-12-25 ENCOUNTER — Encounter: Payer: Self-pay | Admitting: Family Medicine

## 2015-12-25 ENCOUNTER — Ambulatory Visit (INDEPENDENT_AMBULATORY_CARE_PROVIDER_SITE_OTHER): Payer: 59 | Admitting: Family Medicine

## 2015-12-25 VITALS — BP 130/90 | HR 68 | Ht 65.0 in | Wt 187.0 lb

## 2015-12-25 DIAGNOSIS — Z7689 Persons encountering health services in other specified circumstances: Secondary | ICD-10-CM | POA: Diagnosis not present

## 2015-12-25 DIAGNOSIS — I10 Essential (primary) hypertension: Secondary | ICD-10-CM

## 2015-12-25 MED ORDER — HYDROCHLOROTHIAZIDE 25 MG PO TABS: 25.0000 mg | ORAL_TABLET | Freq: Every day | ORAL | 1 refills | Status: DC

## 2015-12-25 NOTE — Patient Instructions (Signed)

## 2015-12-25 NOTE — Progress Notes (Signed)
Name: Denise Bolton   MRN: [id number]    DOB: 04-Nov-1980   Date:12/25/2015       Progress Note  Subjective  Chief Complaint  Chief Complaint  Patient presents with  . Establish Care    needed a PCP/ Ins changed  . Hypertension    started HCTZ x 2 weeks ago- B/P was 190/115    Hypertension  This is a new problem. The current episode started more than 1 year ago. The problem has been waxing and waning since onset. The problem is controlled. Associated symptoms include chest pain. Pertinent negatives include no anxiety, blurred vision, headaches, malaise/fatigue, neck pain, orthopnea, palpitations, peripheral edema, PND, shortness of breath or sweats. There are no associated agents to hypertension. Past treatments include diuretics. The current treatment provides mild improvement. There are no compliance problems.  There is no history of angina, kidney disease, CAD/MI, CVA, heart failure, left ventricular hypertrophy, PVD, renovascular disease or retinopathy. There is no history of chronic renal disease or a hypertension causing med.    No problem-specific Assessment & Plan notes found for this encounter.   Past Medical History:  Diagnosis Date  . Anemia   . GERD (gastroesophageal reflux disease)   . Hypertension     Past Surgical History:  Procedure Laterality Date  . ABDOMINOPLASTY    . BREAST SURGERY     augmentation  . CESAREAN SECTION     x 2  . CYSTOSCOPY  01/11/2015   Procedure: CYSTOSCOPY;  Surgeon: Malachy Mood, MD;  Location: [facility] OR[facility];  Service: Gynecology;;  . LAPAROSCOPIC BILATERAL SALPINGECTOMY Bilateral 01/11/2015   Procedure: LAPAROSCOPIC BILATERAL SALPINGECTOMY;  Surgeon: Malachy Mood, MD;  Location: [facility];  Service: Gynecology;  Laterality: Bilateral;  . LAPAROSCOPIC HYSTERECTOMY N/A 01/11/2015   Procedure: HYSTERECTOMY TOTAL LAPAROSCOPIC;  Surgeon: Malachy Mood, MD;  Location: [facility];  Service: Gynecology;  Laterality: N/A;    Family  History  Problem Relation Age of Onset  . Cancer Mother   . Cancer Father     Social History   Social History  . Marital status: Married    Spouse name: N/A  . Number of children: N/A  . Years of education: N/A   Occupational History  . Not on file.   Social History Main Topics  . Smoking status: Former Smoker    Quit date: 01/02/2006  . Smokeless tobacco: Never Used  . Alcohol use Yes     Comment: occ.  . Drug use: No  . Sexual activity: Yes    Birth control/ protection: None   Other Topics Concern  . Not on file   Social History Narrative  . No narrative on file    No Known Allergies   Review of Systems  Constitutional: Negative for chills, fever, malaise/fatigue and weight loss.  HENT: Negative for ear discharge, ear pain and sore throat.   Eyes: Negative for blurred vision.  Respiratory: Negative for cough, sputum production, shortness of breath and wheezing.   Cardiovascular: Positive for chest pain. Negative for palpitations, orthopnea, leg swelling and PND.  Gastrointestinal: Negative for abdominal pain, blood in stool, constipation, diarrhea, heartburn, melena and nausea.  Genitourinary: Negative for dysuria, frequency, hematuria and urgency.  Musculoskeletal: Negative for back pain, joint pain, myalgias and neck pain.  Skin: Negative for rash.  Neurological: Negative for dizziness, tingling, sensory change, focal weakness and headaches.  Endo/Heme/Allergies: Negative for environmental allergies and polydipsia. Does not bruise/bleed easily.  Psychiatric/Behavioral: Negative for depression and suicidal ideas. The patient  is not nervous/anxious and does not have insomnia.      Objective  Vitals:   12/25/15 1419  BP: 130/90  Pulse: 68  Weight: 187 lb (84.8 kg)  Height: 5\' 5"  (1.651 m)    Physical Exam  Constitutional: She is well-developed, well-nourished, and in no distress. No distress.  HENT:  Head: Normocephalic and atraumatic.  Right Ear:  External ear normal.  Left Ear: External ear normal.  Nose: Nose normal.  Mouth/Throat: Oropharynx is clear and moist.  Eyes: Conjunctivae and EOM are normal. Pupils are equal, round, and reactive to light. Right eye exhibits no discharge. Left eye exhibits no discharge.  Neck: Normal range of motion. Neck supple. No JVD present. No thyromegaly present.  Cardiovascular: Normal rate, regular rhythm, normal heart sounds and intact distal pulses.  Exam reveals no gallop and no friction rub.   No murmur heard. Pulmonary/Chest: Effort normal and breath sounds normal.  Abdominal: Soft. Bowel sounds are normal. She exhibits no mass. There is no tenderness. There is no guarding.  Musculoskeletal: Normal range of motion. She exhibits no edema.  Lymphadenopathy:    She has no cervical adenopathy.  Neurological: She is alert.  Skin: Skin is warm and dry. She is not diaphoretic.  Psychiatric: Mood and affect normal.  Nursing note and vitals reviewed.     Assessment & Plan  Problem List Items Addressed This Visit    None    Visit Diagnoses    Encounter to establish care with new doctor    -  Primary   Essential hypertension       Relevant Medications   hydrochlorothiazide (HYDRODIURIL) 25 MG tablet   Other Relevant Orders   Renal Function Panel        Dr. Otilio Miu [facility]  12/25/15

## 2015-12-26 LAB — RENAL FUNCTION PANEL
Albumin: 4.3 g/dL (ref 3.5–5.5)
BUN/Creatinine Ratio: 20 (ref 9–23)
BUN: 15 mg/dL (ref 6–20)
CO2: 22 mmol/L (ref 18–29)
Calcium: 9.7 mg/dL (ref 8.7–10.2)
Chloride: 98 mmol/L (ref 96–106)
Creatinine, Ser: 0.74 mg/dL (ref 0.57–1.00)
GFR calc Af Amer: 120 mL/min/{1.73_m2} (ref >59)
GFR calc non Af Amer: 104 mL/min/{1.73_m2} (ref >59)
Glucose: 84 mg/dL (ref 65–99)
Phosphorus: 4 mg/dL (ref 2.5–4.5)
Potassium: 4.2 mmol/L (ref 3.5–5.2)
Sodium: 141 mmol/L (ref 134–144)

## 2016-01-15 ENCOUNTER — Ambulatory Visit (INDEPENDENT_AMBULATORY_CARE_PROVIDER_SITE_OTHER): Payer: 59

## 2016-01-15 DIAGNOSIS — Z111 Encounter for screening for respiratory tuberculosis: Secondary | ICD-10-CM | POA: Diagnosis not present

## 2016-01-17 ENCOUNTER — Other Ambulatory Visit: Payer: Self-pay

## 2016-01-17 LAB — TB SKIN TEST
Induration: 0 mm
TB Skin Test: NEGATIVE

## 2016-03-27 ENCOUNTER — Ambulatory Visit: Payer: 59 | Admitting: Family Medicine

## 2016-05-15 ENCOUNTER — Encounter: Payer: Self-pay | Admitting: Family Medicine

## 2016-05-15 ENCOUNTER — Ambulatory Visit (INDEPENDENT_AMBULATORY_CARE_PROVIDER_SITE_OTHER): Payer: 59 | Admitting: Family Medicine

## 2016-05-15 VITALS — BP 118/80 | HR 60 | Ht 65.0 in | Wt 199.0 lb

## 2016-05-15 DIAGNOSIS — I10 Essential (primary) hypertension: Secondary | ICD-10-CM | POA: Diagnosis not present

## 2016-05-15 DIAGNOSIS — N3090 Cystitis, unspecified without hematuria: Secondary | ICD-10-CM | POA: Diagnosis not present

## 2016-05-15 LAB — POCT URINALYSIS DIPSTICK
Bilirubin, UA: NEGATIVE
Glucose, UA: NEGATIVE
Ketones, UA: NEGATIVE
Leukocytes, UA: NEGATIVE
Nitrite, UA: NEGATIVE
Protein, UA: NEGATIVE
Spec Grav, UA: 1.015
Urobilinogen, UA: 0.2
pH, UA: 5

## 2016-05-15 MED ORDER — SULFAMETHOXAZOLE-TRIMETHOPRIM 800-160 MG PO TABS: 1.0000 | ORAL_TABLET | Freq: Two times a day (BID) | ORAL | 0 refills | Status: DC

## 2016-05-15 MED ORDER — HYDROCHLOROTHIAZIDE 25 MG PO TABS: 25.0000 mg | ORAL_TABLET | Freq: Every day | ORAL | 3 refills | Status: DC

## 2016-05-15 NOTE — Progress Notes (Signed)
Name: Denise Bolton   MRN: [id number]    DOB: 05/15/1980   Date:05/15/2016       Progress Note  Subjective  Chief Complaint  Chief Complaint  Patient presents with  . Hypertension    follow up B/P med- doing well on med  . Urinary Tract Infection    noticed some blood in toilet and having lower back pain    Hypertension  This is a chronic problem. The current episode started more than 1 year ago. The problem has been gradually improving since onset. The problem is controlled. Pertinent negatives include no anxiety, blurred vision, chest pain, headaches, malaise/fatigue, neck pain, orthopnea, palpitations, peripheral edema, PND, shortness of breath or sweats. There are no associated agents to hypertension. There are no known risk factors for coronary artery disease. Past treatments include diuretics. The current treatment provides moderate improvement. There are no compliance problems.  There is no history of angina, kidney disease, CAD/MI, CVA, heart failure, left ventricular hypertrophy, PVD or retinopathy. There is no history of chronic renal disease, a hypertension causing med or renovascular disease.  Urinary Tract Infection   This is a new problem. The current episode started today. There has been no fever. Associated symptoms include flank pain, hematuria and urgency. Pertinent negatives include no chills, discharge, frequency, hesitancy, nausea, sweats or vomiting. Associated symptoms comments: Stress urinary incont. She has tried nothing for the symptoms. The treatment provided mild relief.    No problem-specific Assessment & Plan notes found for this encounter.   Past Medical History:  Diagnosis Date  . Anemia   . GERD (gastroesophageal reflux disease)   . Hypertension     Past Surgical History:  Procedure Laterality Date  . ABDOMINOPLASTY    . BREAST SURGERY     augmentation  . CESAREAN SECTION     x 2  . CYSTOSCOPY  01/11/2015   Procedure: CYSTOSCOPY;  Surgeon:  Malachy Mood, MD;  Location: [facility];  Service: Gynecology;;  . LAPAROSCOPIC BILATERAL SALPINGECTOMY Bilateral 01/11/2015   Procedure: LAPAROSCOPIC BILATERAL SALPINGECTOMY;  Surgeon: Malachy Mood, MD;  Location: [facility];  Service: Gynecology;  Laterality: Bilateral;  . LAPAROSCOPIC HYSTERECTOMY N/A 01/11/2015   Procedure: HYSTERECTOMY TOTAL LAPAROSCOPIC;  Surgeon: Malachy Mood, MD;  Location: [facility];  Service: Gynecology;  Laterality: N/A;    Family History  Problem Relation Age of Onset  . Cancer Mother   . Cancer Father     Social History   Social History  . Marital status: Married    Spouse name: N/A  . Number of children: N/A  . Years of education: N/A   Occupational History  . Not on file.   Social History Main Topics  . Smoking status: Former Smoker    Quit date: 01/02/2006  . Smokeless tobacco: Never Used  . Alcohol use Yes     Comment: occ.  . Drug use: No  . Sexual activity: Yes    Birth control/ protection: None   Other Topics Concern  . Not on file   Social History Narrative  . No narrative on file    No Known Allergies  Outpatient Medications Prior to Visit  Medication Sig Dispense Refill  . hydrochlorothiazide (HYDRODIURIL) 25 MG tablet Take 1 tablet (25 mg total) by mouth daily. 90 tablet 1  . ranitidine (ZANTAC) 75 MG tablet Take 75 mg by mouth every morning. otc     No facility-administered medications prior to visit.     Review of Systems  Constitutional: Negative  for chills, fever, malaise/fatigue and weight loss.  HENT: Negative for ear discharge, ear pain and sore throat.   Eyes: Negative for blurred vision.  Respiratory: Negative for cough, sputum production, shortness of breath and wheezing.   Cardiovascular: Negative for chest pain, palpitations, orthopnea, leg swelling and PND.  Gastrointestinal: Negative for abdominal pain, blood in stool, constipation, diarrhea, heartburn, melena, nausea and vomiting.  Genitourinary:  Positive for flank pain, hematuria and urgency. Negative for dysuria, frequency and hesitancy.  Musculoskeletal: Negative for back pain, joint pain, myalgias and neck pain.  Skin: Negative for rash.  Neurological: Negative for dizziness, tingling, sensory change, focal weakness and headaches.  Endo/Heme/Allergies: Negative for environmental allergies and polydipsia. Does not bruise/bleed easily.  Psychiatric/Behavioral: Negative for depression and suicidal ideas. The patient is not nervous/anxious and does not have insomnia.      Objective  Vitals:   05/15/16 1339  BP: 118/80  Pulse: 60  Weight: 199 lb (90.3 kg)  Height: 5\' 5"  (1.651 m)    Physical Exam  Constitutional: She is well-developed, well-nourished, and in no distress. No distress.  HENT:  Head: Normocephalic and atraumatic.  Right Ear: External ear normal.  Left Ear: External ear normal.  Nose: Nose normal.  Mouth/Throat: Oropharynx is clear and moist.  Eyes: Conjunctivae and EOM are normal. Pupils are equal, round, and reactive to light. Right eye exhibits no discharge. Left eye exhibits no discharge.  Neck: Normal range of motion. Neck supple. No JVD present. No thyromegaly present.  Cardiovascular: Normal rate, regular rhythm, normal heart sounds and intact distal pulses.  Exam reveals no gallop and no friction rub.   No murmur heard. Pulmonary/Chest: Effort normal and breath sounds normal. She has no wheezes. She has no rales.  Abdominal: Soft. Bowel sounds are normal. She exhibits no mass. There is no tenderness. There is no guarding.  Musculoskeletal: Normal range of motion. She exhibits no edema.  Lymphadenopathy:    She has no cervical adenopathy.  Neurological: She is alert. She has normal reflexes.  Skin: Skin is warm and dry. She is not diaphoretic.  Psychiatric: Mood and affect normal.  Nursing note and vitals reviewed.     Assessment & Plan  Problem List Items Addressed This Visit    None     Visit Diagnoses    Cystitis    -  Primary   Relevant Medications   sulfamethoxazole-trimethoprim (BACTRIM DS,SEPTRA DS) 800-160 MG tablet   Other Relevant Orders   POCT urinalysis dipstick (Completed)   Essential hypertension       Relevant Medications   hydrochlorothiazide (HYDRODIURIL) 25 MG tablet      Meds ordered this encounter  Medications  . hydrochlorothiazide (HYDRODIURIL) 25 MG tablet    Sig: Take 1 tablet (25 mg total) by mouth daily.    Dispense:  90 tablet    Refill:  3  . sulfamethoxazole-trimethoprim (BACTRIM DS,SEPTRA DS) 800-160 MG tablet    Sig: Take 1 tablet by mouth 2 (two) times daily.    Dispense:  20 tablet    Refill:  0      Dr. Deanna Jones [facility]  05/15/16

## 2016-05-30 DIAGNOSIS — H5213 Myopia, bilateral: Secondary | ICD-10-CM | POA: Diagnosis not present

## 2016-06-06 ENCOUNTER — Encounter: Payer: Self-pay | Admitting: Obstetrics and Gynecology

## 2016-06-06 ENCOUNTER — Ambulatory Visit (INDEPENDENT_AMBULATORY_CARE_PROVIDER_SITE_OTHER): Payer: 59 | Admitting: Obstetrics and Gynecology

## 2016-06-06 DIAGNOSIS — Z1231 Encounter for screening mammogram for malignant neoplasm of breast: Secondary | ICD-10-CM

## 2016-06-06 DIAGNOSIS — Z01419 Encounter for gynecological examination (general) (routine) without abnormal findings: Secondary | ICD-10-CM

## 2016-06-06 DIAGNOSIS — Z1239 Encounter for other screening for malignant neoplasm of breast: Secondary | ICD-10-CM

## 2016-06-06 NOTE — Patient Instructions (Signed)
 Preventive Care 18-39 Years, Female Preventive care refers to lifestyle choices and visits with your health care provider that can promote health and wellness. What does preventive care include?  A yearly physical exam. This is also called an annual well check.  Dental exams once or twice a year.  Routine eye exams. Ask your health care provider how often you should have your eyes checked.  Personal lifestyle choices, including:  Daily care of your teeth and gums.  Regular physical activity.  Eating a healthy diet.  Avoiding tobacco and drug use.  Limiting alcohol use.  Practicing safe sex.  Taking vitamin and mineral supplements as recommended by your health care provider. What happens during an annual well check? The services and screenings done by your health care provider during your annual well check will depend on your age, overall health, lifestyle risk factors, and family history of disease. Counseling  Your health care provider may ask you questions about your:  Alcohol use.  Tobacco use.  Drug use.  Emotional well-being.  Home and relationship well-being.  Sexual activity.  Eating habits.  Work and work environment.  Method of birth control.  Menstrual cycle.  Pregnancy history. Screening  You may have the following tests or measurements:  Height, weight, and BMI.  Diabetes screening. This is done by checking your blood sugar (glucose) after you have not eaten for a while (fasting).  Blood pressure.  Lipid and cholesterol levels. These may be checked every 5 years starting at age 20.  Skin check.  Hepatitis C blood test.  Hepatitis B blood test.  Sexually transmitted disease (STD) testing.  BRCA-related cancer screening. This may be done if you have a family history of breast, ovarian, tubal, or peritoneal cancers.  Pelvic exam and Pap test. This may be done every 3 years starting at age 21. Starting at age 30, this may be done  every 5 years if you have a Pap test in combination with an HPV test. Discuss your test results, treatment options, and if necessary, the need for more tests with your health care provider. Vaccines  Your health care provider may recommend certain vaccines, such as:  Influenza vaccine. This is recommended every year.  Tetanus, diphtheria, and acellular pertussis (Tdap, Td) vaccine. You may need a Td booster every 10 years.  Varicella vaccine. You may need this if you have not been vaccinated.  HPV vaccine. If you are 26 or younger, you may need three doses over 6 months.  Measles, mumps, and rubella (MMR) vaccine. You may need at least one dose of MMR. You may also need a second dose.  Pneumococcal 13-valent conjugate (PCV13) vaccine. You may need this if you have certain conditions and were not previously vaccinated.  Pneumococcal polysaccharide (PPSV23) vaccine. You may need one or two doses if you smoke cigarettes or if you have certain conditions.  Meningococcal vaccine. One dose is recommended if you are age 19-21 years and a first-year college student living in a residence hall, or if you have one of several medical conditions. You may also need additional booster doses.  Hepatitis A vaccine. You may need this if you have certain conditions or if you travel or work in places where you may be exposed to hepatitis A.  Hepatitis B vaccine. You may need this if you have certain conditions or if you travel or work in places where you may be exposed to hepatitis B.  Haemophilus influenzae type b (Hib) vaccine. You may need   this if you have certain risk factors. Talk to your health care provider about which screenings and vaccines you need and how often you need them. This information is not intended to replace advice given to you by your health care provider. Make sure you discuss any questions you have with your health care provider. Document Released: 04/15/2001 Document Revised:  11/07/2015 Document Reviewed: 12/19/2014 Elsevier Interactive Patient Education  2017 Elsevier Inc.  

## 2016-06-06 NOTE — Progress Notes (Signed)
Patient ID: Denise Bolton, female   DOB: 1980/12/25, 36 y.o.   MRN: [id number]     Gynecology Annual Exam  PCP: Otilio Miu, MD  Chief Complaint:  Chief Complaint  Patient presents with  . Gynecologic Exam    History of Present Illness: Patient is a 36 y.o. G2P2002 presents for annual exam. The patient has no complaints today.   LMP: Patient's last menstrual period was 01/08/2015 (exact date).  No menses since hysterectomy  The patient is sexually active. She currently uses s/p hysterectomy for contraception. She denies dyspareunia.  The patient does perform self breast exams.  There is notable family history of breast or ovarian cancer in her family.  Patient has undergone BRCA testing which was negative with a normal lifetime risk of breast cancer per TC model.  The patient wears seatbelts: yes.   The patient has regular exercise: not asked.    The patient denies current symptoms of depression.    Review of Systems: Review of Systems  Constitutional: Negative for chills and fever.  HENT: Negative for congestion.   Respiratory: Negative for cough and shortness of breath.   Cardiovascular: Negative for chest pain and palpitations.  Gastrointestinal: Negative for abdominal pain, constipation, diarrhea, heartburn, nausea and vomiting.  Genitourinary: Negative for dysuria, frequency and urgency.  Skin: Negative for itching and rash.  Neurological: Negative for dizziness and headaches.  Endo/Heme/Allergies: Negative for polydipsia.  Psychiatric/Behavioral: Negative for depression.    Past Medical History:  Past Medical History:  Diagnosis Date  . Anemia   . GERD (gastroesophageal reflux disease)   . Hypertension     Past Surgical History:  Past Surgical History:  Procedure Laterality Date  . ABDOMINOPLASTY    . BREAST SURGERY     augmentation  . CESAREAN SECTION     x 2  . CYSTOSCOPY  01/11/2015   Procedure: CYSTOSCOPY;  Surgeon: Malachy Mood, MD;  Location:  [facility];  Service: Gynecology;;  . LAPAROSCOPIC BILATERAL SALPINGECTOMY Bilateral 01/11/2015   Procedure: LAPAROSCOPIC BILATERAL SALPINGECTOMY;  Surgeon: Malachy Mood, MD;  Location: [facility];  Service: Gynecology;  Laterality: Bilateral;  . LAPAROSCOPIC HYSTERECTOMY N/A 01/11/2015   Procedure: HYSTERECTOMY TOTAL LAPAROSCOPIC;  Surgeon: Malachy Mood, MD;  Location: [facility];  Service: Gynecology;  Laterality: N/A;    Gynecologic History:  Patient's last menstrual period was 01/08/2015 (exact date). Contraception: status post hysterectomy   Obstetric History: G2P2002  Family History:  Family History  Problem Relation Age of Onset  . Ovarian cancer Mother 72  . Melanoma Father 42    Social History:  Social History   Social History  . Marital status: Married    Spouse name: N/A  . Number of children: N/A  . Years of education: N/A   Occupational History  . Not on file.   Social History Main Topics  . Smoking status: Former Smoker    Quit date: 01/02/2006  . Smokeless tobacco: Never Used  . Alcohol use Yes     Comment: occ.  . Drug use: No  . Sexual activity: Yes    Birth control/ protection: None   Other Topics Concern  . Not on file   Social History Narrative  . No narrative on file    Allergies:  No Known Allergies  Medications: Prior to Admission medications   Medication Sig Start Date End Date Taking? Authorizing Provider  hydrochlorothiazide (HYDRODIURIL) 25 MG tablet Take 1 tablet (25 mg total) by mouth daily. 05/15/16   Juline Patch,  MD    Physical Exam Vitals: Blood pressure 128/80, pulse 74, height '5\' 5"'$  (1.651 m), weight 205 lb (93 kg), last menstrual period 01/08/2015.  General: NAD HEENT: normocephalic, anicteric Thyroid: no enlargement, no palpable nodules Pulmonary: No increased work of breathing, CTAB Cardiovascular: RRR, distal pulses 2+ Breast: Breast symmetrical, no tenderness, no palpable nodules or masses, no skin or nipple  retraction present, no nipple discharge.  No axillary or supraclavicular lymphadenopathy. Abdomen: NABS, soft, non-tender, non-distended.  Umbilicus without lesions.  No hepatomegaly, splenomegaly or masses palpable. No evidence of hernia  Genitourinary:  External: Normal external female genitalia.  Normal urethral meatus, normal Bartholin's and Skene's glands.    Vagina: Normal vaginal mucosa, no evidence of prolapse.    Cervix: surgically absent  Uterus: surgically absent  Adnexa: ovaries non-enlarged, no adnexal masses  Rectal: deferred  Lymphatic: no evidence of inguinal lymphadenopathy Extremities: no edema, erythema, or tenderness Neurologic: Grossly intact Psychiatric: mood appropriate, affect full  Female chaperone present for pelvic and breast  portions of the physical exam    Assessment: 36 y.o. G2P2002 routine annual exam  Plan: Problem List Items Addressed This Visit    None      1) STI screening was offered and declined  2) ASCCP guidelines and rational discussed. Not needed given prior hysterectomy with normal pathology  3) Contraception -  N/A   4) Routine healthcare maintenance including cholesterol, diabetes screening discussed managed by PCP  5) Follow up 1 year for routine annual exam

## 2016-06-12 DIAGNOSIS — Z711 Person with feared health complaint in whom no diagnosis is made: Secondary | ICD-10-CM | POA: Diagnosis not present

## 2016-06-12 DIAGNOSIS — L738 Other specified follicular disorders: Secondary | ICD-10-CM | POA: Diagnosis not present

## 2016-06-12 DIAGNOSIS — L814 Other melanin hyperpigmentation: Secondary | ICD-10-CM | POA: Diagnosis not present

## 2016-06-12 DIAGNOSIS — B368 Other specified superficial mycoses: Secondary | ICD-10-CM | POA: Diagnosis not present

## 2016-06-12 DIAGNOSIS — Z09 Encounter for follow-up examination after completed treatment for conditions other than malignant neoplasm: Secondary | ICD-10-CM | POA: Diagnosis not present

## 2016-06-12 DIAGNOSIS — L821 Other seborrheic keratosis: Secondary | ICD-10-CM | POA: Diagnosis not present

## 2016-06-12 DIAGNOSIS — L720 Epidermal cyst: Secondary | ICD-10-CM | POA: Diagnosis not present

## 2016-06-12 DIAGNOSIS — Z872 Personal history of diseases of the skin and subcutaneous tissue: Secondary | ICD-10-CM | POA: Diagnosis not present

## 2016-06-12 DIAGNOSIS — D225 Melanocytic nevi of trunk: Secondary | ICD-10-CM | POA: Diagnosis not present

## 2016-07-21 ENCOUNTER — Other Ambulatory Visit: Payer: Self-pay

## 2016-07-21 MED ORDER — FLUCONAZOLE 150 MG PO TABS: 150.0000 mg | ORAL_TABLET | Freq: Once | ORAL | 0 refills | Status: AC

## 2016-08-11 ENCOUNTER — Telehealth: Payer: Self-pay

## 2016-08-11 NOTE — Telephone Encounter (Signed)
Pt calling - was tx'd for yeast inf c diflucan.  Still has white chalky d/c and burning.  Does she need another round of diflucan or what is the next step?  [phone]

## 2016-08-11 NOTE — Telephone Encounter (Signed)
Pt was seen in April, She will need an appointment to be seen. Please schedule

## 2016-08-21 ENCOUNTER — Ambulatory Visit (INDEPENDENT_AMBULATORY_CARE_PROVIDER_SITE_OTHER): Payer: 59 | Admitting: Obstetrics and Gynecology

## 2016-08-21 ENCOUNTER — Encounter: Payer: Self-pay | Admitting: Obstetrics and Gynecology

## 2016-08-21 VITALS — BP 118/74 | Ht 65.0 in | Wt 200.0 lb

## 2016-08-21 DIAGNOSIS — N898 Other specified noninflammatory disorders of vagina: Secondary | ICD-10-CM | POA: Diagnosis not present

## 2016-08-21 DIAGNOSIS — R102 Pelvic and perineal pain: Secondary | ICD-10-CM

## 2016-08-21 LAB — POCT WET PREP WITH KOH
Clue Cells Wet Prep HPF POC: NEGATIVE
KOH Prep POC: NEGATIVE
Trichomonas, UA: NEGATIVE
Yeast Wet Prep HPF POC: NEGATIVE

## 2016-08-21 MED ORDER — TERCONAZOLE 0.4 % VA CREA: 1.0000 | TOPICAL_CREAM | Freq: Every day | VAGINAL | 0 refills | Status: DC

## 2016-08-21 NOTE — Progress Notes (Signed)
Chief Complaint  Patient presents with  . Vaginitis    HPI:      Ms. Denise Bolton is a 36 y.o. [id number] who LMP was Patient's last menstrual period was 01/08/2015 (exact date)., presents today for vaginal pain and increased d/c since 4/18. She occas has odor after working all day. She has treated with monistat twice wihtout relief and diflucan once with temporary relief for about a wk. She feels pain inside the vaginal canal, not externally. No urin sx, LBP, belly pain, no new sex partners. She is s/p hyst 11/16. She took abx 1 mo before sx started.    Patient Active Problem List   Diagnosis Date Noted  . S/P laparoscopic hysterectomy 01/11/2015  . Iron deficiency anemia 08/22/2014  . HTN (hypertension) 07/11/2013  . Palpitations 07/11/2013  . Preeclampsia 07/11/2013    Family History  Problem Relation Age of Onset  . Ovarian cancer Mother 42       DECEASED 2012-06-25  . Cancer Mother 39       LUNG  . Melanoma Father 2  . Breast cancer Paternal Grandmother     Social History   Social History  . Marital status: Married    Spouse name: N/A  . Number of children: N/A  . Years of education: N/A   Occupational History  . Not on file.   Social History Main Topics  . Smoking status: Former Smoker    Quit date: 01/02/2006  . Smokeless tobacco: Never Used  . Alcohol use Yes     Comment: occ.  . Drug use: No  . Sexual activity: Yes    Birth control/ protection: None   Other Topics Concern  . Not on file   Social History Narrative  . No narrative on file     Current Outpatient Prescriptions:  .  hydrochlorothiazide (HYDRODIURIL) 25 MG tablet, Take 1 tablet (25 mg total) by mouth daily., Disp: 90 tablet, Rfl: 3 .  terconazole (TERAZOL 7) 0.4 % vaginal cream, Place 1 applicator vaginally at bedtime., Disp: 45 g, Rfl: 0  Review of Systems  Constitutional: Negative for fever.  Gastrointestinal: Negative for blood in stool, constipation, diarrhea, nausea and vomiting.    Genitourinary: Positive for vaginal discharge and vaginal pain. Negative for dyspareunia, dysuria, flank pain, frequency, hematuria, urgency and vaginal bleeding.  Musculoskeletal: Negative for back pain.  Skin: Negative for rash.     OBJECTIVE:   Vitals:  BP 118/74   Ht 5\' 5"  (1.651 m)   Wt 200 lb (90.7 kg)   LMP 01/08/2015 (Exact Date)   BMI 33.28 kg/m   Physical Exam  Constitutional: She is oriented to person, place, and time and well-developed, well-nourished, and in no distress. Vital signs are normal.  Genitourinary: Uterus normal, cervix normal, right adnexa normal, left adnexa normal and vulva normal. Uterus is not enlarged. Cervix exhibits no motion tenderness and no tenderness. Right adnexum displays no mass and no tenderness. Left adnexum displays no mass and no tenderness. Vulva exhibits no erythema, no exudate, no lesion, no rash and no tenderness. Vagina exhibits no lesion. Thin  odorless  white and vaginal discharge found.  Genitourinary Comments: TENDER ON SPECULUM EXAM INSIDE VAGINAL; NO LESIONS/ERYTHEMA  Neurological: She is oriented to person, place, and time.  Vitals reviewed.   Results: Results for orders placed or performed in visit on 08/21/16 (from the past 24 hour(s))  POCT Wet Prep with KOH     Status: Normal   Collection Time: 08/21/16  11:04 AM  Result Value Ref Range   Trichomonas, UA Negative    Clue Cells Wet Prep HPF POC NEG    Epithelial Wet Prep HPF POC  Few, Moderate, Many, Too numerous to count   Yeast Wet Prep HPF POC NEG    Bacteria Wet Prep HPF POC  Few   RBC Wet Prep HPF POC     WBC Wet Prep HPF POC     KOH Prep POC Negative Negative     Assessment/Plan: Vaginal pain - Neg exam/wet prep. Treat empirically for yeast. Rx terazol. F/u with Dr. Georgianne Fick if sx persist. - Plan: terconazole (TERAZOL 7) 0.4 % vaginal cream  Vaginal discharge - Plan: terconazole (TERAZOL 7) 0.4 % vaginal cream, POCT Wet Prep with KOH     Meds ordered  this encounter  Medications  . terconazole (TERAZOL 7) 0.4 % vaginal cream    Sig: Place 1 applicator vaginally at bedtime.    Dispense:  45 g    Refill:  0      Return if symptoms worsen or fail to improve.  Cammeron Greis B. Jazion Atteberry, PA-C 08/21/2016 11:06 AM

## 2016-09-18 ENCOUNTER — Telehealth: Payer: Self-pay

## 2016-09-18 ENCOUNTER — Other Ambulatory Visit: Payer: Self-pay

## 2016-09-18 MED ORDER — BUPROPION HCL ER (XL) 300 MG PO TB24: 300.0000 mg | ORAL_TABLET | Freq: Every day | ORAL | 1 refills | Status: DC

## 2016-09-18 NOTE — Telephone Encounter (Signed)
Pt called wanting to start back on Bupropion XL 300mg  qday- sent into [facility] pharm #[id number] with 1 refill- will see before refill runs out

## 2016-10-16 ENCOUNTER — Ambulatory Visit (INDEPENDENT_AMBULATORY_CARE_PROVIDER_SITE_OTHER): Payer: 59 | Admitting: Family Medicine

## 2016-10-16 ENCOUNTER — Encounter: Payer: Self-pay | Admitting: Family Medicine

## 2016-10-16 VITALS — BP 110/70 | HR 78 | Ht 65.0 in | Wt 190.0 lb

## 2016-10-16 DIAGNOSIS — I10 Essential (primary) hypertension: Secondary | ICD-10-CM | POA: Diagnosis not present

## 2016-10-16 DIAGNOSIS — F3341 Major depressive disorder, recurrent, in partial remission: Secondary | ICD-10-CM

## 2016-10-16 MED ORDER — HYDROCHLOROTHIAZIDE 25 MG PO TABS: 25.0000 mg | ORAL_TABLET | Freq: Every day | ORAL | 3 refills | Status: DC

## 2016-10-16 MED ORDER — BUPROPION HCL ER (XL) 300 MG PO TB24: 300.0000 mg | ORAL_TABLET | Freq: Every day | ORAL | 1 refills | Status: DC

## 2016-10-16 NOTE — Progress Notes (Signed)
Name: Denise Bolton   MRN: [id number]    DOB: 17-Feb-1981   Date:10/16/2016       Progress Note  Subjective  Chief Complaint  Chief Complaint  Patient presents with  . Follow-up    depression- feels better since getting back on med    Depression       The patient presents with depression.  This is a chronic problem.  The onset quality is gradual.   The problem occurs daily.  The problem has been gradually improving since onset.  Associated symptoms include no decreased concentration, no fatigue, no helplessness, no hopelessness, does not have insomnia, not irritable, no restlessness, no decreased interest, no appetite change, no body aches, no myalgias, no headaches, no indigestion, not sad and no suicidal ideas.     The symptoms are aggravated by nothing.  Treatments tried: bupropion.  Compliance with treatment is good.  Past compliance problems include difficulty understanding directions.  Previous treatment provided mild relief.  Risk factors include a change in medication usage/dosage.   Past medical history includes depression.     Pertinent negatives include no chronic fatigue syndrome, no hypothyroidism and no thyroid problem.   No problem-specific Assessment & Plan notes found for this encounter.   Past Medical History:  Diagnosis Date  . Anemia   . Family history of ovarian cancer    MOTHER  . Genetic testing of female 10/2014   IBIS=11.1%  . GERD (gastroesophageal reflux disease)   . Hypertension   . Menorrhagia     Past Surgical History:  Procedure Laterality Date  . ABDOMINOPLASTY    . ABDOMINOPLASTY  05-31-2005   FATTY TISSUE REMOVED  . BREAST SURGERY     augmentation  . CESAREAN SECTION     x 2  . CYSTOSCOPY  01/11/2015   Procedure: CYSTOSCOPY;  Surgeon: Malachy Mood, MD;  Location: [facility];  Service: Gynecology;;  . LAPAROSCOPIC BILATERAL SALPINGECTOMY Bilateral 01/11/2015   Procedure: LAPAROSCOPIC BILATERAL SALPINGECTOMY;  Surgeon: Malachy Mood, MD;   Location: [facility];  Service: Gynecology;  Laterality: Bilateral;  . LAPAROSCOPIC HYSTERECTOMY N/A 01/11/2015   Procedure: HYSTERECTOMY TOTAL LAPAROSCOPIC;  Surgeon: Malachy Mood, MD;  Location: [facility];  Service: Gynecology;  Laterality: N/A;    Family History  Problem Relation Age of Onset  . Ovarian cancer Mother 78       DECEASED 05/31/12  . Cancer Mother 59       LUNG  . Melanoma Father 43  . Breast cancer Paternal Grandmother     Social History   Social History  . Marital status: Married    Spouse name: N/A  . Number of children: N/A  . Years of education: N/A   Occupational History  . Not on file.   Social History Main Topics  . Smoking status: Former Smoker    Quit date: 01/02/2006  . Smokeless tobacco: Never Used  . Alcohol use Yes     Comment: occ.  . Drug use: No  . Sexual activity: Yes    Birth control/ protection: None   Other Topics Concern  . Not on file   Social History Narrative  . No narrative on file    No Known Allergies  Outpatient Medications Prior to Visit  Medication Sig Dispense Refill  . buPROPion (WELLBUTRIN XL) 300 MG 24 hr tablet Take 1 tablet (300 mg total) by mouth daily. 30 tablet 1  . hydrochlorothiazide (HYDRODIURIL) 25 MG tablet Take 1 tablet (25 mg total) by mouth daily.  90 tablet 3  . terconazole (TERAZOL 7) 0.4 % vaginal cream Place 1 applicator vaginally at bedtime. 45 g 0   No facility-administered medications prior to visit.     Review of Systems  Constitutional: Negative for appetite change, chills, fatigue, fever, malaise/fatigue and weight loss.  HENT: Negative for ear discharge, ear pain and sore throat.   Eyes: Negative for blurred vision.  Respiratory: Negative for cough, sputum production, shortness of breath and wheezing.   Cardiovascular: Negative for chest pain, palpitations and leg swelling.  Gastrointestinal: Negative for abdominal pain, blood in stool, constipation, diarrhea, heartburn, melena and  nausea.  Genitourinary: Negative for dysuria, frequency, hematuria and urgency.  Musculoskeletal: Negative for back pain, joint pain, myalgias and neck pain.  Skin: Negative for rash.  Neurological: Negative for dizziness, tingling, sensory change, focal weakness and headaches.  Endo/Heme/Allergies: Negative for environmental allergies and polydipsia. Does not bruise/bleed easily.  Psychiatric/Behavioral: Negative for decreased concentration, depression and suicidal ideas. The patient is not nervous/anxious and does not have insomnia.      Objective  Vitals:   10/16/16 1515  BP: 110/70  Pulse: 78  Weight: 190 lb (86.2 kg)  Height: 5\' 5"  (1.651 m)    Physical Exam  Constitutional: She is well-developed, well-nourished, and in no distress. She is not irritable. No distress.  HENT:  Head: Normocephalic and atraumatic.  Right Ear: External ear normal.  Left Ear: External ear normal.  Nose: Nose normal.  Mouth/Throat: Oropharynx is clear and moist.  Eyes: Pupils are equal, round, and reactive to light. Conjunctivae and EOM are normal. Right eye exhibits no discharge. Left eye exhibits no discharge.  Neck: Normal range of motion. Neck supple. No JVD present. No thyromegaly present.  Cardiovascular: Normal rate, regular rhythm, normal heart sounds and intact distal pulses.  Exam reveals no gallop and no friction rub.   No murmur heard. Pulmonary/Chest: Effort normal and breath sounds normal.  Abdominal: Soft. Bowel sounds are normal. She exhibits no mass. There is no tenderness. There is no guarding.  Musculoskeletal: Normal range of motion. She exhibits no edema.  Lymphadenopathy:    She has no cervical adenopathy.  Neurological: She is alert.  Skin: Skin is warm and dry. She is not diaphoretic.  Psychiatric: Mood and affect normal.  Nursing note and vitals reviewed.     Assessment & Plan  Problem List Items Addressed This Visit      Cardiovascular and Mediastinum   HTN  (hypertension)   Relevant Medications   hydrochlorothiazide (HYDRODIURIL) 25 MG tablet    Other Visit Diagnoses    Recurrent major depressive disorder, in partial remission ([place])    -  Primary   Relevant Medications   buPROPion (WELLBUTRIN XL) 300 MG 24 hr tablet      Meds ordered this encounter  Medications  . buPROPion (WELLBUTRIN XL) 300 MG 24 hr tablet    Sig: Take 1 tablet (300 mg total) by mouth daily.    Dispense:  90 tablet    Refill:  1  . hydrochlorothiazide (HYDRODIURIL) 25 MG tablet    Sig: Take 1 tablet (25 mg total) by mouth daily.    Dispense:  90 tablet    Refill:  3      Dr. Otilio Miu [facility]  10/16/16

## 2017-02-12 ENCOUNTER — Ambulatory Visit
Admission: EM | Admit: 2017-02-12 | Discharge: 2017-02-12 | Discharge status: Absent without leave | Payer: PRIVATE HEALTH INSURANCE

## 2017-03-05 DIAGNOSIS — F4323 Adjustment disorder with mixed anxiety and depressed mood: Secondary | ICD-10-CM | POA: Diagnosis not present

## 2017-03-20 DIAGNOSIS — F4323 Adjustment disorder with mixed anxiety and depressed mood: Secondary | ICD-10-CM | POA: Diagnosis not present

## 2017-04-17 DIAGNOSIS — F4323 Adjustment disorder with mixed anxiety and depressed mood: Secondary | ICD-10-CM | POA: Diagnosis not present

## 2017-05-01 DIAGNOSIS — F4323 Adjustment disorder with mixed anxiety and depressed mood: Secondary | ICD-10-CM | POA: Diagnosis not present

## 2017-06-22 ENCOUNTER — Encounter: Payer: Self-pay | Admitting: Family Medicine

## 2017-06-22 ENCOUNTER — Ambulatory Visit (INDEPENDENT_AMBULATORY_CARE_PROVIDER_SITE_OTHER): Payer: 59 | Admitting: Family Medicine

## 2017-06-22 VITALS — BP 124/82 | HR 76 | Ht 65.0 in | Wt 176.0 lb

## 2017-06-22 DIAGNOSIS — F3341 Major depressive disorder, recurrent, in partial remission: Secondary | ICD-10-CM

## 2017-06-22 DIAGNOSIS — I10 Essential (primary) hypertension: Secondary | ICD-10-CM | POA: Diagnosis not present

## 2017-06-22 DIAGNOSIS — Z0000 Encounter for general adult medical examination without abnormal findings: Secondary | ICD-10-CM

## 2017-06-22 DIAGNOSIS — S161XXA Strain of muscle, fascia and tendon at neck level, initial encounter: Secondary | ICD-10-CM | POA: Diagnosis not present

## 2017-06-22 MED ORDER — HYDROCHLOROTHIAZIDE 25 MG PO TABS: 25.0000 mg | ORAL_TABLET | Freq: Every day | ORAL | 3 refills | Status: DC

## 2017-06-22 NOTE — Progress Notes (Addendum)
Name: Denise Bolton   MRN: [id number]    DOB: 11-28-1980   Date:06/22/2017       Progress Note  Subjective  Chief Complaint  Chief Complaint  Patient presents with  . Annual Exam    no pap  . Neck Pain    has had a "crick in my neck for five days since flying"  . Hypertension    refill and labs for meds and PE    Patient presents for annual physical exam for health preventitive purposes.  Neck Pain   This is a new problem. The current episode started in the past 7 days. The problem occurs constantly. The problem has been waxing and waning. The pain is associated with nothing (inconvience on flight). The pain is present in the right side. The quality of the pain is described as aching and cramping. The pain is at a severity of 3/10. The pain is mild. The symptoms are aggravated by position, bending and twisting. Pertinent negatives include no chest pain, fever, headaches, pain with swallowing, paresis, tingling, trouble swallowing or weight loss.  Hypertension  This is a chronic problem. The current episode started more than 1 year ago. The problem is unchanged. The problem is controlled. Associated symptoms include neck pain. Pertinent negatives include no anxiety, blurred vision, chest pain, headaches, malaise/fatigue, orthopnea, palpitations, peripheral edema, PND, shortness of breath or sweats. There are no associated agents to hypertension. There are no known risk factors for coronary artery disease. Past treatments include diuretics. The current treatment provides mild improvement. There are no compliance problems.  There is no history of angina, kidney disease, CAD/MI, CVA, heart failure, left ventricular hypertrophy, PVD or retinopathy.    No problem-specific Assessment & Plan notes found for this encounter.   Past Medical History:  Diagnosis Date  . Anemia   . Family history of ovarian cancer    MOTHER  . Genetic testing of female 10/2014   IBIS=11.1%  . GERD  (gastroesophageal reflux disease)   . Hypertension   . Menorrhagia     Past Surgical History:  Procedure Laterality Date  . ABDOMINOPLASTY    . ABDOMINOPLASTY  24-Jun-2005   FATTY TISSUE REMOVED  . BREAST SURGERY     augmentation  . CESAREAN SECTION     x 2  . CYSTOSCOPY  01/11/2015   Procedure: CYSTOSCOPY;  Surgeon: Malachy Mood, MD;  Location: [facility];  Service: Gynecology;;  . LAPAROSCOPIC BILATERAL SALPINGECTOMY Bilateral 01/11/2015   Procedure: LAPAROSCOPIC BILATERAL SALPINGECTOMY;  Surgeon: Malachy Mood, MD;  Location: [facility];  Service: Gynecology;  Laterality: Bilateral;  . LAPAROSCOPIC HYSTERECTOMY N/A 01/11/2015   Procedure: HYSTERECTOMY TOTAL LAPAROSCOPIC;  Surgeon: Malachy Mood, MD;  Location: [facility];  Service: Gynecology;  Laterality: N/A;    Family History  Problem Relation Age of Onset  . Ovarian cancer Mother 44       DECEASED 2012-06-24  . Cancer Mother 71       LUNG  . Melanoma Father 27  . Breast cancer Paternal Grandmother     Social History   Socioeconomic History  . Marital status: Married    Spouse name: Not on file  . Number of children: Not on file  . Years of education: Not on file  . Highest education level: Not on file  Occupational History  . Not on file  Social Needs  . Financial resource strain: Not on file  . Food insecurity:    Worry: Not on file    Inability:  Not on file  . Transportation needs:    Medical: Not on file    Non-medical: Not on file  Tobacco Use  . Smoking status: Former Smoker    Last attempt to quit: 01/02/2006    Years since quitting: 11.4  . Smokeless tobacco: Never Used  Substance and Sexual Activity  . Alcohol use: Yes    Comment: occ.  . Drug use: No  . Sexual activity: Yes    Birth control/protection: None  Lifestyle  . Physical activity:    Days per week: Not on file    Minutes per session: Not on file  . Stress: Not on file  Relationships  . Social connections:    Talks on phone: Not on  file    Gets together: Not on file    Attends religious service: Not on file    Active member of club or organization: Not on file    Attends meetings of clubs or organizations: Not on file    Relationship status: Not on file  . Intimate partner violence:    Fear of current or ex partner: Not on file    Emotionally abused: Not on file    Physically abused: Not on file    Forced sexual activity: Not on file  Other Topics Concern  . Not on file  Social History Narrative  . Not on file    No Known Allergies  Outpatient Medications Prior to Visit  Medication Sig Dispense Refill  . buPROPion (WELLBUTRIN XL) 300 MG 24 hr tablet Take 1 tablet (300 mg total) by mouth daily. 90 tablet 1  . hydrochlorothiazide (HYDRODIURIL) 25 MG tablet Take 1 tablet (25 mg total) by mouth daily. 90 tablet 3   No facility-administered medications prior to visit.     Review of Systems  Constitutional: Negative for chills, fever, malaise/fatigue and weight loss.  HENT: Negative for ear discharge, ear pain, sore throat and trouble swallowing.   Eyes: Negative for blurred vision.  Respiratory: Negative for cough, sputum production, shortness of breath and wheezing.   Cardiovascular: Negative for chest pain, palpitations, orthopnea, leg swelling and PND.  Gastrointestinal: Negative for abdominal pain, blood in stool, constipation, diarrhea, heartburn, melena and nausea.  Genitourinary: Negative for dysuria, frequency, hematuria and urgency.  Musculoskeletal: Positive for neck pain. Negative for back pain, joint pain and myalgias.  Skin: Negative for rash.  Neurological: Negative for dizziness, tingling, sensory change, focal weakness and headaches.  Endo/Heme/Allergies: Negative for environmental allergies and polydipsia. Does not bruise/bleed easily.  Psychiatric/Behavioral: Negative for depression and suicidal ideas. The patient is not nervous/anxious and does not have insomnia.       Objective  Vitals:   06/22/17 0917  BP: 124/82  Pulse: 76  Weight: 176 lb (79.8 kg)  Height: 5\' 5"  (1.651 m)    Physical Exam  Constitutional: She is oriented to person, place, and time. Vital signs are normal. She appears well-developed and well-nourished. No distress.  HENT:  Head: Normocephalic and atraumatic.  Right Ear: Hearing, tympanic membrane, external ear and ear canal normal.  Left Ear: Hearing, tympanic membrane, external ear and ear canal normal.  Nose: Nose normal.  Mouth/Throat: Oropharynx is clear and moist. No oropharyngeal exudate, posterior oropharyngeal edema, posterior oropharyngeal erythema or tonsillar abscesses.  Eyes: Pupils are equal, round, and reactive to light. Conjunctivae, EOM and lids are normal. Right eye exhibits no discharge. Left eye exhibits no discharge. Right conjunctiva is not injected. Left conjunctiva is not injected.  Fundoscopic exam:  The right eye shows no arteriolar narrowing, no AV nicking and no papilledema.       The left eye shows no arteriolar narrowing, no AV nicking and no papilledema.  Neck: Normal range of motion. Neck supple. Normal carotid pulses, no hepatojugular reflux and no JVD present. Carotid bruit is not present. No thyroid mass and no thyromegaly present.  Cardiovascular: Normal rate, regular rhythm, S1 normal, S2 normal, normal heart sounds and intact distal pulses. Exam reveals no gallop, no S3, no S4 and no friction rub.  No murmur heard.  No systolic murmur is present.  No diastolic murmur is present. Pulses:      Carotid pulses are 2+ on the right side, and 2+ on the left side.      Radial pulses are 2+ on the right side, and 2+ on the left side.       Femoral pulses are 2+ on the right side, and 2+ on the left side.      Popliteal pulses are 2+ on the right side, and 2+ on the left side.       Dorsalis pedis pulses are 2+ on the right side, and 2+ on the left side.       Posterior tibial pulses are 2+  on the right side, and 2+ on the left side.  Pulmonary/Chest: Effort normal and breath sounds normal. No respiratory distress. She has no decreased breath sounds. She has no wheezes. She has no rhonchi. She has no rales.  Abdominal: Soft. Bowel sounds are normal. She exhibits no mass. There is no hepatosplenomegaly. There is no tenderness. There is no rigidity and no guarding. No hernia.  Musculoskeletal: Normal range of motion. She exhibits no edema.       Cervical back: She exhibits spasm.  Feet:  Right Foot:  Skin Integrity: Negative for ulcer.  Left Foot:  Skin Integrity: Negative for ulcer.  Lymphadenopathy:       Head (right side): No submental and no submandibular adenopathy present.       Head (left side): No submental and no submandibular adenopathy present.    She has no cervical adenopathy.  Neurological: She is alert and oriented to person, place, and time. She has normal strength and normal reflexes. She displays normal reflexes. No sensory deficit.  Reflex Scores:      Tricep reflexes are 2+ on the right side and 2+ on the left side.      Bicep reflexes are 2+ on the right side and 2+ on the left side.      Brachioradialis reflexes are 2+ on the right side and 2+ on the left side.      Patellar reflexes are 2+ on the right side and 2+ on the left side.      Achilles reflexes are 2+ on the right side and 2+ on the left side. Skin: Skin is warm and dry. She is not diaphoretic.  Nursing note and vitals reviewed.     Assessment & Plan  Problem List Items Addressed This Visit      Cardiovascular and Mediastinum   HTN (hypertension)   Relevant Medications   hydrochlorothiazide (HYDRODIURIL) 25 MG tablet   Other Relevant Orders   Renal Function Panel     Other   Recurrent major depressive disorder, in partial remission ([place])    Other Visit Diagnoses    Annual physical exam    -  Primary   for health preventitive purposes   Relevant Orders  Renal Function Panel    Lipid panel   Strain of neck muscle, initial encounter          Meds ordered this encounter  Medications  . hydrochlorothiazide (HYDRODIURIL) 25 MG tablet    Sig: Take 1 tablet (25 mg total) by mouth daily.    Dispense:  90 tablet    Refill:  3  Denise Bolton is a 37 y.o. female who presents today for her Complete Annual Exam. She feels well. She reports exercising . She reports she is sleeping well.  Immunizations are reviewed and recommendations provided.   Age appropriate screening tests are discussed. Counseling given for risk factor reduction interventions.   Dr. Mac[facility]  06/22/17

## 2017-06-23 LAB — RENAL FUNCTION PANEL
Albumin: 4.1 g/dL (ref 3.5–5.5)
BUN/Creatinine Ratio: 21 (ref 9–23)
BUN: 15 mg/dL (ref 6–20)
CO2: 23 mmol/L (ref 20–29)
Calcium: 9 mg/dL (ref 8.7–10.2)
Chloride: 101 mmol/L (ref 96–106)
Creatinine, Ser: 0.7 mg/dL (ref 0.57–1.00)
GFR calc Af Amer: 126 mL/min/{1.73_m2} (ref >59)
GFR calc non Af Amer: 109 mL/min/{1.73_m2} (ref >59)
Glucose: 81 mg/dL (ref 65–99)
Phosphorus: 3.3 mg/dL (ref 2.5–4.5)
Potassium: 4.8 mmol/L (ref 3.5–5.2)
Sodium: 137 mmol/L (ref 134–144)

## 2017-06-23 LAB — LIPID PANEL
Chol/HDL Ratio: 2.7 ratio (ref 0.0–4.4)
Cholesterol, Total: 212 mg/dL — ABNORMAL HIGH (ref 100–199)
HDL: 79 mg/dL (ref >39)
LDL Calculated: 121 mg/dL — ABNORMAL HIGH (ref 0–99)
Triglycerides: 61 mg/dL (ref 0–149)
VLDL Cholesterol Cal: 12 mg/dL (ref 5–40)

## 2017-07-07 ENCOUNTER — Encounter: Payer: Self-pay | Admitting: Obstetrics and Gynecology

## 2017-07-09 ENCOUNTER — Encounter: Payer: Self-pay | Admitting: Obstetrics and Gynecology

## 2017-07-10 ENCOUNTER — Ambulatory Visit (INDEPENDENT_AMBULATORY_CARE_PROVIDER_SITE_OTHER): Payer: 59 | Admitting: Obstetrics and Gynecology

## 2017-07-10 ENCOUNTER — Encounter: Payer: Self-pay | Admitting: Obstetrics and Gynecology

## 2017-07-10 DIAGNOSIS — Z01419 Encounter for gynecological examination (general) (routine) without abnormal findings: Secondary | ICD-10-CM

## 2017-07-10 DIAGNOSIS — Z1231 Encounter for screening mammogram for malignant neoplasm of breast: Secondary | ICD-10-CM | POA: Diagnosis not present

## 2017-07-10 DIAGNOSIS — Z1239 Encounter for other screening for malignant neoplasm of breast: Secondary | ICD-10-CM

## 2017-07-10 NOTE — Progress Notes (Signed)
Gynecology Annual Exam   PCP: Juline Patch, MD  Chief Complaint:  Chief Complaint  Patient presents with  . Gynecologic Exam  . Breast exam    Breast lump on right side    History of Present Illness: Patient is a 37 y.o. G2P2002 presents for annual exam. The patient has no complaints today.   LMP: Patient's last menstrual period was 01/08/2015 (exact date). No menstrual history secondary to prior total hysterectomy  The patient is sexually active. She currently uses status post hysterectomy for contraception. She denies dyspareunia.  The patient does not perform self breast exams.  There is notable family history of breast or ovarian cancer in her family.  She has noted a small mass in the right breast recently (past week)  The patient wears seatbelts: yes.   The patient has regular exercise: not asked.    The patient denies current symptoms of depression.    Review of Systems: Review of Systems  Constitutional: Negative for chills and fever.  HENT: Negative for congestion.   Respiratory: Negative for cough and shortness of breath.   Cardiovascular: Negative for chest pain and palpitations.  Gastrointestinal: Negative for abdominal pain, constipation, diarrhea, heartburn, nausea and vomiting.  Genitourinary: Negative for dysuria, frequency and urgency.  Skin: Negative for itching and rash.  Neurological: Negative for dizziness and headaches.  Endo/Heme/Allergies: Negative for polydipsia.  Psychiatric/Behavioral: Negative for depression.    Past Medical History:  Past Medical History:  Diagnosis Date  . Anemia   . BRCA negative   . Family history of ovarian cancer    MOTHER  . Genetic testing of female 10/2014   IBIS=11.1%  . GERD (gastroesophageal reflux disease)   . Hypertension   . Menorrhagia     Past Surgical History:  Past Surgical History:  Procedure Laterality Date  . ABDOMINOPLASTY    . ABDOMINOPLASTY  06-04-05   FATTY TISSUE REMOVED  . BREAST  SURGERY     augmentation  . CESAREAN SECTION     x 2  . CYSTOSCOPY  01/11/2015   Procedure: CYSTOSCOPY;  Surgeon: Malachy Mood, MD;  Location: [facility];  Service: Gynecology;;  . LAPAROSCOPIC BILATERAL SALPINGECTOMY Bilateral 01/11/2015   Procedure: LAPAROSCOPIC BILATERAL SALPINGECTOMY;  Surgeon: Malachy Mood, MD;  Location: [facility];  Service: Gynecology;  Laterality: Bilateral;  . LAPAROSCOPIC HYSTERECTOMY N/A 01/11/2015   Procedure: HYSTERECTOMY TOTAL LAPAROSCOPIC;  Surgeon: Malachy Mood, MD;  Location: [facility];  Service: Gynecology;  Laterality: N/A;    Gynecologic History:  Patient's last menstrual period was 01/08/2015 (exact date). Contraception: status post hysterectomy Last Pap: Results were: N/A status post hysterecotmy  Obstetric History: G2P2002  Family History:  Family History  Problem Relation Age of Onset  . Ovarian cancer Mother 66       DECEASED June 04, 2012  . Cancer Mother 74       LUNG  . Melanoma Father 47  . Breast cancer Paternal Grandmother     Social History:  Social History   Socioeconomic History  . Marital status: Married    Spouse name: Not on file  . Number of children: Not on file  . Years of education: Not on file  . Highest education level: Not on file  Occupational History  . Not on file  Social Needs  . Financial resource strain: Not on file  . Food insecurity:    Worry: Not on file    Inability: Not on file  . Transportation needs:  Medical: Not on file    Non-medical: Not on file  Tobacco Use  . Smoking status: Former Smoker    Last attempt to quit: 01/02/2006    Years since quitting: 11.5  . Smokeless tobacco: Never Used  Substance and Sexual Activity  . Alcohol use: Yes    Comment: occ.  . Drug use: No  . Sexual activity: Yes    Birth control/protection: None  Lifestyle  . Physical activity:    Days per week: 3 days    Minutes per session: 30 min  . Stress: Not at all  Relationships  . Social connections:      Talks on phone: Not on file    Gets together: Not on file    Attends religious service: Not on file    Active member of club or organization: Not on file    Attends meetings of clubs or organizations: Not on file    Relationship status: Not on file  . Intimate partner violence:    Fear of current or ex partner: Not on file    Emotionally abused: Not on file    Physically abused: Not on file    Forced sexual activity: Not on file  Other Topics Concern  . Not on file  Social History Narrative  . Not on file    Allergies:  No Known Allergies  Medications: Prior to Admission medications   Medication Sig Start Date End Date Taking? Authorizing Provider  hydrochlorothiazide (HYDRODIURIL) 25 MG tablet Take 1 tablet (25 mg total) by mouth daily. 06/22/17  Yes Juline Patch, MD  ranitidine (ZANTAC) 75 MG tablet Take by mouth.   Yes [provider]    Physical Exam Vitals: Blood pressure 122/86, pulse 70, height '5\' 5"'$  (1.651 m), weight 177 lb (80.3 kg), last menstrual period 01/08/2015.  General: NAD HEENT: normocephalic, anicteric Thyroid: no enlargement, no palpable nodules Pulmonary: No increased work of breathing, CTAB Cardiovascular: RRR, distal pulses 2+ Breast: Breast symmetrical, no tenderness, there is a small 2cm round, well circumscribed mobile mass in the right breast upper outer quadrant, no skin or nipple retraction present, no nipple discharge.  No axillary or supraclavicular lymphadenopathy. Abdomen: NABS, soft, non-tender, non-distended.  Umbilicus without lesions.  No hepatomegaly, splenomegaly or masses palpable. No evidence of hernia  Genitourinary:  External: Normal external female genitalia.  Normal urethral meatus, normal Bartholin's and Skene's glands.    Vagina: Normal vaginal mucosa, no evidence of prolapse.    Cervix: surgically absent  Uterus: surgically absent  Adnexa: ovaries non-enlarged, no adnexal masses  Rectal: deferred  Lymphatic: no  evidence of inguinal lymphadenopathy Extremities: no edema, erythema, or tenderness Neurologic: Grossly intact Psychiatric: mood appropriate, affect full  Female chaperone present for pelvic and breast  portions of the physical exam    Assessment: 37 y.o. G2P2002 routine annual exam  Plan: Problem List Items Addressed This Visit    None    Visit Diagnoses    Breast screening       Encounter for gynecological examination without abnormal finding          2) STI screening  was notoffered and therefore not obtained  2)  ASCCP guidelines and rational discussed.  Patient opts for discontinue secondary to prior hysterectomy screening interval  3) Contraception - the patient is currently using  status post hysterectomy. She is not currently in need of contraception secondary to being sterile  4) Routine healthcare maintenance including cholesterol, diabetes screening discussed managed by PCP  5) Fibrocystic changes in left breast - will re-examine in 4 weeks.  Does have notable family history of breast cancer but negative prior BRCA testing.  If changes in size, tenderness, discussed proceeding with diagnostic mammogram.  I also offered proceeding with imaging immediately but at present patient elects for expectant management  6)  Return in about 1 month (around 08/07/2017) for Clinical breast exam.   Malachy Mood, MD, [facility], [facility] 07/10/2017, 11:46 AM

## 2017-07-24 ENCOUNTER — Other Ambulatory Visit: Payer: Self-pay | Admitting: Obstetrics and Gynecology

## 2017-07-24 ENCOUNTER — Encounter: Payer: Self-pay | Admitting: Obstetrics and Gynecology

## 2017-07-24 ENCOUNTER — Ambulatory Visit: Payer: 59 | Admitting: Obstetrics and Gynecology

## 2017-07-24 DIAGNOSIS — N6311 Unspecified lump in the right breast, upper outer quadrant: Secondary | ICD-10-CM

## 2017-08-04 ENCOUNTER — Other Ambulatory Visit: Payer: Self-pay | Admitting: *Deleted

## 2017-08-04 ENCOUNTER — Inpatient Hospital Stay
Admission: RE | Admit: 2017-08-04 | Discharge: 2017-08-04 | Disposition: A | Discharge status: Home / Self Care | Payer: Self-pay | Source: Ambulatory Visit | Attending: *Deleted | Admitting: *Deleted

## 2017-08-04 ENCOUNTER — Other Ambulatory Visit: Payer: Self-pay | Admitting: Obstetrics and Gynecology

## 2017-08-04 DIAGNOSIS — Z9289 Personal history of other medical treatment: Secondary | ICD-10-CM

## 2017-08-04 DIAGNOSIS — N6311 Unspecified lump in the right breast, upper outer quadrant: Secondary | ICD-10-CM

## 2017-08-06 ENCOUNTER — Encounter: Payer: Self-pay | Admitting: Obstetrics and Gynecology

## 2017-08-07 ENCOUNTER — Ambulatory Visit: Payer: 59 | Admitting: Obstetrics and Gynecology

## 2017-08-17 ENCOUNTER — Ambulatory Visit
Admission: RE | Admit: 2017-08-17 | Discharge: 2017-08-17 | Disposition: A | Discharge status: Home / Self Care | Payer: 59 | Source: Ambulatory Visit | Attending: Obstetrics and Gynecology | Admitting: Obstetrics and Gynecology

## 2017-08-17 ENCOUNTER — Other Ambulatory Visit: Payer: Self-pay | Admitting: Obstetrics and Gynecology

## 2017-08-17 DIAGNOSIS — N6001 Solitary cyst of right breast: Secondary | ICD-10-CM | POA: Diagnosis not present

## 2017-08-17 DIAGNOSIS — R921 Mammographic calcification found on diagnostic imaging of breast: Secondary | ICD-10-CM | POA: Diagnosis not present

## 2017-08-17 DIAGNOSIS — N6311 Unspecified lump in the right breast, upper outer quadrant: Secondary | ICD-10-CM

## 2017-08-18 ENCOUNTER — Other Ambulatory Visit: Payer: Self-pay | Admitting: Obstetrics and Gynecology

## 2017-08-18 ENCOUNTER — Encounter (INDEPENDENT_AMBULATORY_CARE_PROVIDER_SITE_OTHER): Payer: Self-pay

## 2017-08-18 DIAGNOSIS — R928 Other abnormal and inconclusive findings on diagnostic imaging of breast: Secondary | ICD-10-CM

## 2017-08-18 DIAGNOSIS — N6001 Solitary cyst of right breast: Secondary | ICD-10-CM

## 2017-12-03 ENCOUNTER — Ambulatory Visit: Payer: BC Managed Care – PPO | Admitting: Family Medicine

## 2017-12-03 ENCOUNTER — Encounter: Payer: Self-pay | Admitting: Family Medicine

## 2017-12-03 VITALS — BP 140/92 | HR 80 | Ht 65.0 in | Wt 173.0 lb

## 2017-12-03 DIAGNOSIS — M79609 Pain in unspecified limb: Secondary | ICD-10-CM | POA: Diagnosis not present

## 2017-12-03 DIAGNOSIS — I10 Essential (primary) hypertension: Secondary | ICD-10-CM

## 2017-12-03 MED ORDER — LISINOPRIL 5 MG PO TABS
5.0000 mg | ORAL_TABLET | Freq: Every day | ORAL | 0 refills | Status: DC
Start: 2017-12-03 — End: 2017-12-17

## 2017-12-03 NOTE — Progress Notes (Signed)
Date:  12/03/2017   Name:  Denise [facility]   DOB:  October 21, 1980   MRN:  [id number]   Chief Complaint: Knee Pain (pulsating behind the L) knee over the weekend- went to ER for chest pain and pulsating pain- did EKG and cardiac enzymes were normal. [place] was negative for a blood clot in the leg and chest)  Knee Pain   The incident occurred more than 1 week ago (pulsating sensation). There was no injury mechanism. The pain is present in the left knee. The patient is experiencing no pain. Pertinent negatives include no inability to bear weight, loss of motion, loss of sensation, muscle weakness or numbness. She has tried nothing for the symptoms.  Hypertension  This is a chronic problem. The current episode started more than 1 year ago. The problem has been waxing and waning since onset. The problem is uncontrolled. Pertinent negatives include no anxiety, blurred vision, chest pain, orthopnea, palpitations, peripheral edema, PND, shortness of breath or sweats. The current treatment provides mild improvement. There are no compliance problems.  There is no history of angina, kidney disease, CAD/MI, CVA, heart failure, left ventricular hypertrophy, PVD or retinopathy. There is no history of chronic renal disease, a hypertension causing med or renovascular disease.    Review of Systems  Eyes: Negative for blurred vision.  Respiratory: Negative for shortness of breath.   Cardiovascular: Negative for chest pain, palpitations, orthopnea and PND.  Neurological: Negative for numbness.    Patient Active Problem List   Diagnosis Date Noted  . Recurrent major depressive disorder, in partial remission ([place]) 06/22/2017  . S/P laparoscopic hysterectomy 01/11/2015  . Iron deficiency anemia 08/22/2014  . HTN (hypertension) 07/11/2013  . Palpitations 07/11/2013  . Preeclampsia 07/11/2013    No Known Allergies  Past Surgical History:  Procedure Laterality Date  . ABDOMINOPLASTY    . ABDOMINOPLASTY  2007     FATTY TISSUE REMOVED  . AUGMENTATION MAMMAPLASTY Bilateral 2007  . BREAST SURGERY     augmentation  . CESAREAN SECTION     x 2  . CYSTOSCOPY  01/11/2015   Procedure: CYSTOSCOPY;  Surgeon: Malachy Mood, MD;  Location: [facility];  Service: Gynecology;;  . LAPAROSCOPIC BILATERAL SALPINGECTOMY Bilateral 01/11/2015   Procedure: LAPAROSCOPIC BILATERAL SALPINGECTOMY;  Surgeon: Malachy Mood, MD;  Location: [facility];  Service: Gynecology;  Laterality: Bilateral;  . LAPAROSCOPIC HYSTERECTOMY N/A 01/11/2015   Procedure: HYSTERECTOMY TOTAL LAPAROSCOPIC;  Surgeon: Malachy Mood, MD;  Location: [facility];  Service: Gynecology;  Laterality: N/A;    Social History   Tobacco Use  . Smoking status: Former Smoker    Last attempt to quit: 01/02/2006    Years since quitting: 11.9  . Smokeless tobacco: Never Used  Substance Use Topics  . Alcohol use: Yes    Comment: occ.  . Drug use: No     Medication list has been reviewed and updated.  Current Meds  Medication Sig  . [DISCONTINUED] hydrochlorothiazide (HYDRODIURIL) 25 MG tablet Take 1 tablet (25 mg total) by mouth daily. (Patient taking differently: Take 25 mg by mouth every other day. )  . [DISCONTINUED] ranitidine (ZANTAC) 75 MG tablet Take by mouth.    PHQ 2/9 Scores 06/22/2017 10/16/2016  PHQ - 2 Score 0 0  PHQ- 9 Score 0 4    Physical Exam  Constitutional: No distress.  HENT:  Head: Normocephalic and atraumatic.  Right Ear: External ear normal.  Left Ear: External ear normal.  Nose: Nose normal.  Mouth/Throat: Oropharynx  is clear and moist.  Eyes: Pupils are equal, round, and reactive to light. Conjunctivae and EOM are normal. Right eye exhibits no discharge. Left eye exhibits no discharge.  Neck: Normal range of motion. Neck supple. No JVD present. No thyromegaly present.  Cardiovascular: Normal rate, regular rhythm, S1 normal, S2 normal, normal heart sounds and intact distal pulses. Exam reveals no gallop, no S3, no S4,  no distant heart sounds and no friction rub.  No murmur heard. Pulses:      Carotid pulses are 2+ on the right side, and 2+ on the left side.      Radial pulses are 2+ on the right side, and 2+ on the left side.       Femoral pulses are 2+ on the right side, and 2+ on the left side.      Popliteal pulses are 2+ on the right side, and 2+ on the left side.       Dorsalis pedis pulses are 2+ on the right side, and 2+ on the left side.       Posterior tibial pulses are 2+ on the right side, and 2+ on the left side.  No pulsatile mass/   Pulmonary/Chest: Effort normal and breath sounds normal.  Abdominal: Soft. Bowel sounds are normal. She exhibits no mass. There is no tenderness. There is no guarding.  Musculoskeletal: Normal range of motion. She exhibits no edema.       Left knee: She exhibits no swelling, no effusion and no deformity.  Lymphadenopathy:    She has no cervical adenopathy.  Neurological: She is alert. She has normal reflexes.  Skin: Skin is warm and dry. She is not diaphoretic.  Nursing note and vitals reviewed.   BP (!) 140/92   Pulse 80   Ht 5\' 5"  (1.651 m)   Wt 173 lb (78.5 kg)   LMP 01/08/2015 (Exact Date)   BMI 28.79 kg/m   Assessment and Plan:  1. Popliteal pain No enlarged bursa,nor pulsatile mass (aneurysm) 2. Essential hypertension Will d/c diruetic due to diuresis. Will start lisinopril 5 mg daily,recheck 2 weeks. - lisinopril (PRINIVIL,ZESTRIL) 5 MG tablet; Take 1 tablet (5 mg total) by mouth daily.  Dispense: 30 tablet; Refill: 0   Dr. [facility] [place]  12/03/2017

## 2017-12-04 ENCOUNTER — Ambulatory Visit: Payer: 59 | Admitting: Family Medicine

## 2017-12-17 ENCOUNTER — Encounter: Payer: Self-pay | Admitting: Family Medicine

## 2017-12-17 ENCOUNTER — Ambulatory Visit: Payer: BC Managed Care – PPO | Admitting: Family Medicine

## 2017-12-17 VITALS — BP 120/78 | HR 68 | Ht 65.0 in | Wt 180.0 lb

## 2017-12-17 DIAGNOSIS — I10 Essential (primary) hypertension: Secondary | ICD-10-CM

## 2017-12-17 MED ORDER — LISINOPRIL 5 MG PO TABS: 5.0000 mg | ORAL_TABLET | Freq: Every day | ORAL | 1 refills | Status: DC

## 2017-12-17 NOTE — Progress Notes (Signed)
Date:  12/17/2017   Name:  Denise Bolton   DOB:  27-Nov-1980   MRN:  [id number]   Chief Complaint: Hypertension (started lisinopril 2 weeks ago- recheck on med) Hypertension  This is a chronic problem. The current episode started more than 1 year ago. The problem has been gradually improving since onset. Pertinent negatives include no anxiety, blurred vision, chest pain, headaches, malaise/fatigue, neck pain, orthopnea, palpitations, peripheral edema, PND, shortness of breath or sweats. There are no associated agents to hypertension. There are no known risk factors for coronary artery disease.     Review of Systems  Constitutional: Negative for chills, fever and malaise/fatigue.  HENT: Negative for drooling, ear discharge, ear pain and sore throat.   Eyes: Negative for blurred vision.  Respiratory: Negative for cough, shortness of breath and wheezing.   Cardiovascular: Negative for chest pain, palpitations, orthopnea, leg swelling and PND.  Gastrointestinal: Negative for abdominal pain, blood in stool, constipation, diarrhea and nausea.  Endocrine: Negative for polydipsia.  Genitourinary: Negative for dysuria, frequency, hematuria and urgency.  Musculoskeletal: Negative for back pain, myalgias and neck pain.  Skin: Negative for rash.  Allergic/Immunologic: Negative for environmental allergies.  Neurological: Negative for dizziness and headaches.  Hematological: Does not bruise/bleed easily.  Psychiatric/Behavioral: Negative for suicidal ideas. The patient is not nervous/anxious.     Patient Active Problem List   Diagnosis Date Noted  . Recurrent major depressive disorder, in partial remission ([place]) 06/22/2017  . S/P laparoscopic hysterectomy 01/11/2015  . Iron deficiency anemia 08/22/2014  . HTN (hypertension) 07/11/2013  . Palpitations 07/11/2013  . Preeclampsia 07/11/2013    No Known Allergies  Past Surgical History:  Procedure Laterality Date  . ABDOMINOPLASTY    .  ABDOMINOPLASTY  2007   FATTY TISSUE REMOVED  . AUGMENTATION MAMMAPLASTY Bilateral 2007  . BREAST SURGERY     augmentation  . CESAREAN SECTION     x 2  . CYSTOSCOPY  01/11/2015   Procedure: CYSTOSCOPY;  Surgeon: Malachy Mood, MD;  Location: [facility];  Service: Gynecology;;  . LAPAROSCOPIC BILATERAL SALPINGECTOMY Bilateral 01/11/2015   Procedure: LAPAROSCOPIC BILATERAL SALPINGECTOMY;  Surgeon: Malachy Mood, MD;  Location: [facility];  Service: Gynecology;  Laterality: Bilateral;  . LAPAROSCOPIC HYSTERECTOMY N/A 01/11/2015   Procedure: HYSTERECTOMY TOTAL LAPAROSCOPIC;  Surgeon: Malachy Mood, MD;  Location: [facility];  Service: Gynecology;  Laterality: N/A;    Social History   Tobacco Use  . Smoking status: Former Smoker    Last attempt to quit: 01/02/2006    Years since quitting: 11.9  . Smokeless tobacco: Never Used  Substance Use Topics  . Alcohol use: Yes    Comment: occ.  . Drug use: No     Medication list has been reviewed and updated.  Current Meds  Medication Sig  . lisinopril (PRINIVIL,ZESTRIL) 5 MG tablet Take 1 tablet (5 mg total) by mouth daily.    PHQ 2/9 Scores 06/22/2017 10/16/2016  PHQ - 2 Score 0 0  PHQ- 9 Score 0 4    Physical Exam  Constitutional: She is oriented to person, place, and time. She appears well-developed and well-nourished.  HENT:  Head: Normocephalic.  Right Ear: External ear normal.  Left Ear: External ear normal.  Mouth/Throat: Oropharynx is clear and moist.  Eyes: Pupils are equal, round, and reactive to light. Conjunctivae and EOM are normal. Lids are everted and swept, no foreign bodies found. Left eye exhibits no hordeolum. No foreign body present in the left eye. Right conjunctiva  is not injected. Left conjunctiva is not injected. No scleral icterus.  Neck: Normal range of motion. Neck supple. No JVD present. No tracheal deviation present. No thyromegaly present.  Cardiovascular: Normal rate, regular rhythm, normal heart  sounds and intact distal pulses. Exam reveals no gallop and no friction rub.  No murmur heard. Pulmonary/Chest: Effort normal and breath sounds normal. No respiratory distress. She has no wheezes. She has no rales.  Abdominal: Soft. Bowel sounds are normal. She exhibits no mass. There is no hepatosplenomegaly. There is no tenderness. There is no rebound and no guarding.  Musculoskeletal: Normal range of motion. She exhibits no edema or tenderness.  Lymphadenopathy:    She has no cervical adenopathy.  Neurological: She is alert and oriented to person, place, and time. She has normal strength. She displays normal reflexes. No cranial nerve deficit.  Skin: Skin is warm. No rash noted.  Psychiatric: She has a normal mood and affect. Her mood appears not anxious. She does not exhibit a depressed mood.  Nursing note and vitals reviewed.   BP 120/78   Pulse 68   Ht 5\' 5"  (1.651 m)   Wt 180 lb (81.6 kg)   LMP 01/08/2015 (Exact Date)   BMI 29.95 kg/m   Assessment and Plan:  1. Essential hypertension Chronic Controlled. Continue lisinopril 5 mg daily rechecl in 6 months. - lisinopril (PRINIVIL,ZESTRIL) 5 MG tablet; Take 1 tablet (5 mg total) by mouth daily.  Dispense: 90 tablet; Refill: 1   Dr. [facility]  12/17/2017

## 2017-12-30 ENCOUNTER — Other Ambulatory Visit: Payer: Self-pay | Admitting: Obstetrics and Gynecology

## 2017-12-30 DIAGNOSIS — R928 Other abnormal and inconclusive findings on diagnostic imaging of breast: Secondary | ICD-10-CM

## 2017-12-30 DIAGNOSIS — R921 Mammographic calcification found on diagnostic imaging of breast: Secondary | ICD-10-CM

## 2018-02-17 ENCOUNTER — Ambulatory Visit
Admission: RE | Admit: 2018-02-17 | Discharge: 2018-02-17 | Disposition: A | Discharge status: Home / Self Care | Payer: No Typology Code available for payment source | Source: Ambulatory Visit | Attending: Obstetrics and Gynecology | Admitting: Obstetrics and Gynecology

## 2018-02-17 DIAGNOSIS — R921 Mammographic calcification found on diagnostic imaging of breast: Secondary | ICD-10-CM | POA: Diagnosis present

## 2018-02-17 DIAGNOSIS — R928 Other abnormal and inconclusive findings on diagnostic imaging of breast: Secondary | ICD-10-CM | POA: Diagnosis not present

## 2018-02-18 ENCOUNTER — Other Ambulatory Visit: Payer: Self-pay | Admitting: Obstetrics and Gynecology

## 2018-02-18 DIAGNOSIS — R921 Mammographic calcification found on diagnostic imaging of breast: Secondary | ICD-10-CM

## 2018-02-26 ENCOUNTER — Encounter: Payer: Self-pay | Admitting: Family Medicine

## 2018-02-26 NOTE — Telephone Encounter (Signed)
Please Advise message with Dr Ronnald Ramp since Baxter Flattery is out today. Thank you.

## 2018-03-08 ENCOUNTER — Other Ambulatory Visit: Payer: Self-pay

## 2018-03-08 DIAGNOSIS — F3341 Major depressive disorder, recurrent, in partial remission: Secondary | ICD-10-CM

## 2018-03-08 MED ORDER — BUPROPION HCL ER (XL) 300 MG PO TB24: 300.0000 mg | ORAL_TABLET | Freq: Every day | ORAL | 1 refills | Status: DC

## 2018-04-27 ENCOUNTER — Other Ambulatory Visit: Payer: Self-pay

## 2018-04-27 ENCOUNTER — Ambulatory Visit
Admission: EM | Admit: 2018-04-27 | Discharge: 2018-04-27 | Disposition: A | Discharge status: Home / Self Care | Payer: No Typology Code available for payment source | Attending: Family Medicine | Admitting: Family Medicine

## 2018-04-27 ENCOUNTER — Encounter: Payer: Self-pay | Admitting: Emergency Medicine

## 2018-04-27 DIAGNOSIS — W260XXA Contact with knife, initial encounter: Secondary | ICD-10-CM | POA: Diagnosis not present

## 2018-04-27 DIAGNOSIS — Z23 Encounter for immunization: Secondary | ICD-10-CM | POA: Diagnosis not present

## 2018-04-27 DIAGNOSIS — S61212A Laceration without foreign body of right middle finger without damage to nail, initial encounter: Secondary | ICD-10-CM | POA: Diagnosis not present

## 2018-04-27 MED ORDER — MUPIROCIN 2 % EX OINT: 1.0000 "application " | TOPICAL_OINTMENT | Freq: Three times a day (TID) | CUTANEOUS | 0 refills | Status: DC

## 2018-04-27 MED ORDER — TETANUS-DIPHTH-ACELL PERTUSSIS 5-2.5-18.5 LF-MCG/0.5 IM SUSP
0.5000 mL | Freq: Once | INTRAMUSCULAR | Status: AC
  Administered 2018-04-27: 0.5 mL via INTRAMUSCULAR

## 2018-04-27 NOTE — ED Triage Notes (Signed)
Patient in today after cutting her right middle finger while cooking today.  Patient's last tetanus was 08/30/12 per [facility]where records.

## 2018-04-27 NOTE — Discharge Instructions (Addendum)
Keep the wound dry for 24 hours.  Then the you can start washing 3 times a day and apply Bactroban ointment to the wound 3 times daily until sutures are removed in 10 days.  Return to our clinic if any signs or symptoms of infection occur.

## 2018-04-27 NOTE — ED Provider Notes (Signed)
[facility]    CSN: [id number] Arrival date & time: 04/27/18  1558     History   Chief Complaint Chief Complaint  Patient presents with  . finger laceration    right middle finger    HPI Denise [facility] is a 38 y.o. female.   HPI  38 year old female presents today after cutting her right middle finger with a knife that was falling off the counter.  Last tetanus was on 08/30/2012.  Trouble controlling the bleeding.  Duration is approximately 7 mm in length.  Extends into the subcutaneous tissue.  Is located over the volar proximal fold of the middle phalanx.         Past Medical History:  Diagnosis Date  . Anemia   . BRCA negative   . Family history of ovarian cancer    MOTHER  . Genetic testing of female 10/2014   IBIS=11.1%  . GERD (gastroesophageal reflux disease)   . Hypertension   . Menorrhagia     Patient Active Problem List   Diagnosis Date Noted  . Recurrent major depressive disorder, in partial remission ([place]) 06/22/2017  . S/P laparoscopic hysterectomy 01/11/2015  . Iron deficiency anemia 08/22/2014  . HTN (hypertension) 07/11/2013  . Palpitations 07/11/2013  . Preeclampsia 07/11/2013    Past Surgical History:  Procedure Laterality Date  . ABDOMINAL HYSTERECTOMY    . ABDOMINOPLASTY    . ABDOMINOPLASTY  05/13/05   FATTY TISSUE REMOVED  . AUGMENTATION MAMMAPLASTY Bilateral 05/13/05  . BREAST SURGERY     augmentation  . CESAREAN SECTION     x 2  . CYSTOSCOPY  01/11/2015   Procedure: CYSTOSCOPY;  Surgeon: Malachy Mood, MD;  Location: [facility];  Service: Gynecology;;  . LAPAROSCOPIC BILATERAL SALPINGECTOMY Bilateral 01/11/2015   Procedure: LAPAROSCOPIC BILATERAL SALPINGECTOMY;  Surgeon: Malachy Mood, MD;  Location: [facility];  Service: Gynecology;  Laterality: Bilateral;  . LAPAROSCOPIC HYSTERECTOMY N/A 01/11/2015   Procedure: HYSTERECTOMY TOTAL LAPAROSCOPIC;  Surgeon: Malachy Mood, MD;  Location: [facility];  Service: Gynecology;   Laterality: N/A;    OB History    Gravida  2   Para  2   Term  2   Preterm      AB      Living  2     SAB      TAB      Ectopic      Multiple      Live Births  2            Home Medications    Prior to Admission medications   Medication Sig Start Date End Date Taking? Authorizing Provider  buPROPion (WELLBUTRIN XL) 300 MG 24 hr tablet Take 1 tablet (300 mg total) by mouth daily. 03/08/18  Yes Juline Patch, MD  lisinopril (PRINIVIL,ZESTRIL) 5 MG tablet Take 1 tablet (5 mg total) by mouth daily. 12/17/17  Yes Juline Patch, MD  mupirocin ointment (BACTROBAN) 2 % Apply 1 application topically 3 (three) times daily. 04/27/18   Lorin Picket, PA-C    Family History Family History  Problem Relation Age of Onset  . Ovarian cancer Mother 64       DECEASED 05/13/12  . Cancer Mother 31       LUNG  . Melanoma Father 55    Social History Social History   Tobacco Use  . Smoking status: Former Smoker    Last attempt to quit: 01/02/2006    Years since quitting: 12.3  . Smokeless  tobacco: Never Used  Substance Use Topics  . Alcohol use: Yes    Comment: occ.  . Drug use: No     Allergies   Patient has no known allergies.   Review of Systems Review of Systems  Constitutional: Positive for activity change. Negative for appetite change, chills, fatigue and fever.  Skin: Positive for wound.  All other systems reviewed and are negative.    Physical Exam Triage Vital Signs ED Triage Vitals  Enc Vitals Group     BP 04/27/18 1619 (!) 125/98     Pulse Rate 04/27/18 1619 71     Resp 04/27/18 1619 16     Temp 04/27/18 1619 98.1 F (36.7 C)     Temp Source 04/27/18 1619 Oral     SpO2 04/27/18 1619 100 %     Weight 04/27/18 1620 170 lb (77.1 kg)     Height 04/27/18 1620 _0  (1.651 m)     Head Circumference --      Peak Flow --      Pain Score 04/27/18 1619 7     Pain Loc --      Pain Edu? --      Excl. in [place]? --    No data found.  Updated  Vital Signs BP (!) 125/98 (BP Location: Left Arm)   Pulse 71   Temp 98.1 F (36.7 C) (Oral)   Resp 16   Ht _1  (1.651 m)   Wt 170 lb (77.1 kg)   LMP 01/08/2015 (Exact Date)   SpO2 100%   BMI 28.29 kg/m   Visual Acuity Right Eye Distance:   Left Eye Distance:   Bilateral Distance:    Right Eye Near:   Left Eye Near:    Bilateral Near:     Physical Exam Vitals signs and nursing note reviewed.  Constitutional:      General: She is not in acute distress.    Appearance: Normal appearance. She is not ill-appearing, toxic-appearing or diaphoretic.  HENT:     Head: Normocephalic and atraumatic.     Nose: Nose normal.     Mouth/Throat:     Mouth: Mucous membranes are moist.  Eyes:     General:        Right eye: No discharge.        Left eye: No discharge.     Conjunctiva/sclera: Conjunctivae normal.  Neck:     Musculoskeletal: Normal range of motion and neck supple.  Musculoskeletal: Normal range of motion.        General: Tenderness and signs of injury present. No swelling or deformity.  Skin:    General: Skin is warm and dry.     Comments: Exam of the right dominant middle finger shows a volar transverse laceration over the proximal fold overlying the PIP.Marland Kitchen  It extends into the subcutaneous tissue.  It measures 7 mm in length and 2 mm in depth.  Sensation is intact to light touch distally.  FDS and FDP are both strong to resistance through its full range.  Show good  pull-through.  Neurological:     General: No focal deficit present.     Mental Status: She is alert and oriented to person, place, and time.  Psychiatric:        Mood and Affect: Mood normal.        Behavior: Behavior normal.        Thought Content: Thought content normal.        Judgment:  Judgment normal.      UC Treatments / Results  Labs (all labs ordered are listed, but only abnormal results are displayed) Labs Reviewed - No data to display  EKG None  Radiology No results  found.  Procedures Laceration Repair Date/Time: 04/27/2018 6:24 PM Performed by: Lorin Picket, PA-C Authorized by: Coral Spikes, DO   Consent:    Consent obtained:  Verbal   Consent given by:  Patient   Risks discussed:  Infection Anesthesia (see MAR for exact dosages):    Anesthesia method:  Local infiltration   Local anesthetic:  Lidocaine 1% w/o epi Laceration details:    Location:  Finger   Finger location:  R long finger   Length (cm):  0.7   Depth (mm):  2 Repair type:    Repair type:  Simple Pre-procedure details:    Preparation:  Patient was prepped and draped in usual sterile fashion Exploration:    Hemostasis achieved with:  Direct pressure   Contaminated: no   Treatment:    Area cleansed with:  Betadine and saline   Amount of cleaning:  Standard   Irrigation solution:  Sterile saline   Visualized foreign bodies/material removed: no   Skin repair:    Repair method:  Sutures   Suture size:  5-0   Suture material:  Nylon   Suture technique:  Simple interrupted   Number of sutures:  4 Approximation:    Approximation:  Close Post-procedure details:    Dressing:  Bulky dressing   Patient tolerance of procedure:  Tolerated well, no immediate complications Comments:     Keep dry for 24 hours.  Begin  a washing program 3 times daily with application of Bactroban ointment  Until sutures are removed in 10 days.  If you develop any infection return to our clinic immediately   (including critical care time)  Medications Ordered in UC Medications  Tdap (BOOSTRIX) injection 0.5 mL (0.5 mLs Intramuscular Given 04/27/18 1625)    Initial Impression / Assessment and Plan / UC Course  I have reviewed the triage vital signs and the nursing notes.  Pertinent labs & imaging results that were available during my care of the patient were reviewed by me and considered in my medical decision making (see chart for details).    Keep dry for 24 hours.  Begin  a washing  program 3 times daily with application of Bactroban ointment  Until sutures are removed in 10 days.  If you develop any infection return to our clinic immediately   Final Clinical Impressions(s) / UC Diagnoses   Final diagnoses:  Laceration of right middle finger without foreign body without damage to nail, initial encounter     Discharge Instructions     Keep the wound dry for 24 hours.  Then the you can start washing 3 times a day and apply Bactroban ointment to the wound 3 times daily until sutures are removed in 10 days.  Return to our clinic if any signs or symptoms of infection occur.    ED Prescriptions    Medication Sig Dispense Auth. Provider   mupirocin ointment (BACTROBAN) 2 % Apply 1 application topically 3 (three) times daily. 22 g Lorin Picket, PA-C     Controlled Substance Prescriptions [place] Controlled Substance Registry consulted? Not Applicable   Lorin Picket, PA-C 04/27/18 1836

## 2018-05-27 ENCOUNTER — Other Ambulatory Visit: Payer: Self-pay | Admitting: Family Medicine

## 2018-05-27 DIAGNOSIS — F3341 Major depressive disorder, recurrent, in partial remission: Secondary | ICD-10-CM

## 2018-08-01 ENCOUNTER — Other Ambulatory Visit: Payer: Self-pay | Admitting: Family Medicine

## 2018-08-01 DIAGNOSIS — F3341 Major depressive disorder, recurrent, in partial remission: Secondary | ICD-10-CM

## 2018-08-19 ENCOUNTER — Other Ambulatory Visit: Payer: Self-pay

## 2018-08-30 ENCOUNTER — Other Ambulatory Visit: Payer: Self-pay | Admitting: Family Medicine

## 2018-08-30 DIAGNOSIS — F3341 Major depressive disorder, recurrent, in partial remission: Secondary | ICD-10-CM

## 2018-09-02 ENCOUNTER — Ambulatory Visit
Admission: RE | Admit: 2018-09-02 | Discharge: 2018-09-02 | Disposition: A | Discharge status: Home / Self Care | Payer: 59 | Source: Ambulatory Visit | Attending: Obstetrics and Gynecology | Admitting: Obstetrics and Gynecology

## 2018-09-02 ENCOUNTER — Other Ambulatory Visit: Payer: Self-pay

## 2018-09-02 ENCOUNTER — Other Ambulatory Visit: Payer: Self-pay | Admitting: Family Medicine

## 2018-09-02 DIAGNOSIS — R921 Mammographic calcification found on diagnostic imaging of breast: Secondary | ICD-10-CM

## 2018-09-02 DIAGNOSIS — I10 Essential (primary) hypertension: Secondary | ICD-10-CM

## 2018-09-14 ENCOUNTER — Other Ambulatory Visit: Payer: Self-pay | Admitting: Family Medicine

## 2018-09-14 DIAGNOSIS — F3341 Major depressive disorder, recurrent, in partial remission: Secondary | ICD-10-CM

## 2018-09-23 ENCOUNTER — Other Ambulatory Visit: Payer: Self-pay | Admitting: Family Medicine

## 2018-09-23 DIAGNOSIS — I10 Essential (primary) hypertension: Secondary | ICD-10-CM

## 2018-10-13 ENCOUNTER — Ambulatory Visit (INDEPENDENT_AMBULATORY_CARE_PROVIDER_SITE_OTHER): Payer: 59 | Admitting: Obstetrics and Gynecology

## 2018-10-13 ENCOUNTER — Encounter: Payer: Self-pay | Admitting: Obstetrics and Gynecology

## 2018-10-13 ENCOUNTER — Other Ambulatory Visit: Payer: Self-pay

## 2018-10-13 VITALS — BP 150/96 | Ht 65.0 in | Wt 177.0 lb

## 2018-10-13 DIAGNOSIS — N761 Subacute and chronic vaginitis: Secondary | ICD-10-CM

## 2018-10-13 DIAGNOSIS — N941 Unspecified dyspareunia: Secondary | ICD-10-CM

## 2018-10-13 DIAGNOSIS — R102 Pelvic and perineal pain: Secondary | ICD-10-CM

## 2018-10-13 NOTE — Progress Notes (Signed)
Gynecology Pelvic Pain Evaluation   Chief Complaint:  Chief Complaint  Patient presents with  . Vaginal Pain    two months ago during intercourse  . Vaginal Discharge    no odor noticed, no itchiness just irritation, dryness    History of Present Illness:   Patient is a 38 y.o. [id number] who LMP was Patient's last menstrual period was 01/08/2015 (exact date)., presents today for a problem visit.  She complains of  Dysparunia/pelvic pain as well as vaginal irritation over the past month.  Her pain is localized to the deep pelvis and vagina area, described as intermittent, began in the past month and its severity is described as moderate. The pain radiates to the  Non-radiating. She has these associated symptoms which include nausea. Patient has these modifiers which include nothing that make it better and intercourse that make it worse.    Previous evaluation: none. Prior Diagnosis: none. Previous Treatment: she has a remote history of hysterectomy 4 years ago with pelvic pain and dysmenorrhea.  At the time some light omental adhesions were noted but no evidence of endometriosis.  She has self treated with monistat 3 without improvement or resolution in symptoms.  .  Review of Systems: Review of Systems  Constitutional: Negative.   Gastrointestinal: Positive for abdominal pain. Negative for constipation, diarrhea, nausea and vomiting.  Genitourinary: Negative.   Skin: Negative.     Past Medical History:  Past Medical History:  Diagnosis Date  . Anemia   . BRCA negative   . Family history of ovarian cancer    MOTHER  . Genetic testing of female 10/2014   IBIS=11.1%  . GERD (gastroesophageal reflux disease)   . Hypertension   . Menorrhagia     Past Surgical History:  Past Surgical History:  Procedure Laterality Date  . ABDOMINAL HYSTERECTOMY    . ABDOMINOPLASTY    . ABDOMINOPLASTY  20-May-2005   FATTY TISSUE REMOVED  . AUGMENTATION MAMMAPLASTY Bilateral 2005/05/20  . BREAST SURGERY      augmentation  . CESAREAN SECTION     x 2  . CYSTOSCOPY  01/11/2015   Procedure: CYSTOSCOPY;  Surgeon: Malachy Mood, MD;  Location: [facility];  Service: Gynecology;;  . LAPAROSCOPIC BILATERAL SALPINGECTOMY Bilateral 01/11/2015   Procedure: LAPAROSCOPIC BILATERAL SALPINGECTOMY;  Surgeon: Malachy Mood, MD;  Location: [facility];  Service: Gynecology;  Laterality: Bilateral;  . LAPAROSCOPIC HYSTERECTOMY N/A 01/11/2015   Procedure: HYSTERECTOMY TOTAL LAPAROSCOPIC;  Surgeon: Malachy Mood, MD;  Location: [facility];  Service: Gynecology;  Laterality: N/A;    Gynecologic History:  Patient's last menstrual period was 01/08/2015 (exact date).  Obstetric History: [id number]  Family History:  Family History  Problem Relation Age of Onset  . Ovarian cancer Mother 62       DECEASED 2012-05-20  . Cancer Mother 13       LUNG  . Melanoma Father 13    Social History:  Social History   Socioeconomic History  . Marital status: Married    Spouse name: Not on file  . Number of children: Not on file  . Years of education: Not on file  . Highest education level: Not on file  Occupational History  . Not on file  Social Needs  . Financial resource strain: Not on file  . Food insecurity    Worry: Not on file    Inability: Not on file  . Transportation needs    Medical: Not on file    Non-medical: Not on file  Tobacco Use  . Smoking status: Former Smoker    Quit date: 01/02/2006    Years since quitting: 12.8  . Smokeless tobacco: Never Used  Substance and Sexual Activity  . Alcohol use: Yes    Comment: occ.  . Drug use: No  . Sexual activity: Yes    Birth control/protection: Surgical    Comment: Hysterectomy  Lifestyle  . Physical activity    Days per week: 3 days    Minutes per session: 30 min  . Stress: Not at all  Relationships  . Social Herbalist on phone: Not on file    Gets together: Not on file    Attends religious service: Not on file    Active member of  club or organization: Not on file    Attends meetings of clubs or organizations: Not on file    Relationship status: Not on file  . Intimate partner violence    Fear of current or ex partner: Not on file    Emotionally abused: Not on file    Physically abused: Not on file    Forced sexual activity: Not on file  Other Topics Concern  . Not on file  Social History Narrative  . Not on file    Allergies:  No Known Allergies  Medications: Prior to Admission medications   Medication Sig Start Date End Date Taking? Authorizing Provider  buPROPion (WELLBUTRIN XL) 300 MG 24 hr tablet TAKE 1 TABLET BY MOUTH EVERY DAY 09/14/18  Yes Otilio Miu C, MD  lisinopril (ZESTRIL) 5 MG tablet TAKE 1 TABLET BY MOUTH EVERY DAY 09/23/18  Yes Juline Patch, MD    Physical Exam Vitals: Blood pressure (!) 150/96, height '5\' 5"'$  (1.651 m), weight 177 lb (80.3 kg), last menstrual period 01/08/2015.  General: NAD HEENT: normocephalic, anicteric Pulmonary: No increased work of breathing Abdomen: NABS, soft, non-tender, non-distended.  Umbilicus without lesions.  No hepatomegaly, splenomegaly or masses palpable. No evidence of hernia  Genitourinary:  External: Normal external female genitalia.  Normal urethral meatus, normal  Bartholin's and Skene's glands.    Vagina: Normal vaginal mucosa, no evidence of prolapse.    Cervix:surgically absent  Uterus: surgically absent  Adnexa: ovaries non-enlarged, no adnexal masses  Rectal: deferred  Lymphatic: no evidence of inguinal lymphadenopathy Extremities: no edema, erythema, or tenderness Neurologic: Grossly intact Psychiatric: mood appropriate, affect full  Female chaperone present for pelvic portion of the physical exam  Assessment: 38 y.o. [id number] with vaginal/pelvic pain  Problem List Items Addressed This Visit    None    Visit Diagnoses    Vaginal pain    -  Primary   Relevant Orders   NuSwab BV and Candida, NAA (Completed)   US Transvaginal  Non-OB   Subacute vaginitis       Relevant Orders   NuSwab BV and Candida, NAA (Completed)   Dyspareunia, female       Relevant Orders   NuSwab BV and Candida, NAA (Completed)   US Transvaginal Non-OB       1) We discussed the possible etiologies for pelvic pain in women.  Gynecologic causes may include endometriosis, adenomyosis, pelvic inflammatory disease (PID), ovarian cysts, ovarian or tubal torsion, and in rare case gynecologic malignancy such as cervical, uterine, or ovarian cancer.  In addition thee possibility of non-gynecologic etiologies such as urinary or GI tract pathology or disordered, as well as musculoskeletal problems.  The goal is to complete a basic work up in hopes of  identifying the underlying cause which in turn will dictate treatment.  In the meantime supportive measures such as localized heat, and NSAIDs are reasonable first steps.     - Prescription drug database was not reviewed, UDS was not ordered - Transvaginal ultrasound ordered - Blood work obtained today No  - Cervical cultures Yes  2) Return in about 1 week (around 10/20/2018) for TVUS and follow up.   Malachy Mood, MD, [facility], [place]

## 2018-10-15 LAB — NUSWAB BV AND CANDIDA, NAA
Candida albicans, NAA: NEGATIVE
Candida glabrata, NAA: NEGATIVE

## 2018-10-18 ENCOUNTER — Ambulatory Visit (INDEPENDENT_AMBULATORY_CARE_PROVIDER_SITE_OTHER): Payer: 59 | Admitting: Obstetrics and Gynecology

## 2018-10-18 ENCOUNTER — Other Ambulatory Visit: Payer: Self-pay

## 2018-10-18 ENCOUNTER — Ambulatory Visit (INDEPENDENT_AMBULATORY_CARE_PROVIDER_SITE_OTHER): Payer: 59

## 2018-10-18 DIAGNOSIS — R102 Pelvic and perineal pain: Secondary | ICD-10-CM

## 2018-10-18 DIAGNOSIS — N941 Unspecified dyspareunia: Secondary | ICD-10-CM

## 2018-10-18 NOTE — Progress Notes (Signed)
I connected with Denise Bolton on 10/21/18 at  4:30 PM EDT by telephone and verified that I am speaking with the correct person using two identifiers.   I discussed the limitations, risks, security and privacy concerns of performing an evaluation and management service by telephone and the availability of in person appointments. I also discussed with the patient that there may be a patient responsible charge related to this service. The patient expressed understanding and agreed to proceed.  The patient was at home I spoke with the patient from my workstation phone The names of people involved in this encounter were: Denise Bolton , and Malachy Mood   Gynecology Ultrasound Follow Up  Chief Complaint: No chief complaint on file.   History of Present Illness: Patient is a 38 y.o. female who presents today for ultrasound evaluation of pelvic pain .  Ultrasound demonstrates the following findgins Adnexa: no adnexal masses  Uterus: surgically absent Additional: no free fluid  Patient with prior negative BRCA testing  Review of Systems: Review of Systems  Constitutional: Negative.   Gastrointestinal: Positive for abdominal pain.  Skin: Negative.     Past Medical History:  Past Medical History:  Diagnosis Date  . Anemia   . BRCA negative   . Family history of ovarian cancer    MOTHER  . Genetic testing of female 10/2014   IBIS=11.1%  . GERD (gastroesophageal reflux disease)   . Hypertension   . Menorrhagia     Past Surgical History:  Past Surgical History:  Procedure Laterality Date  . ABDOMINAL HYSTERECTOMY    . ABDOMINOPLASTY    . ABDOMINOPLASTY  26-May-2005   FATTY TISSUE REMOVED  . AUGMENTATION MAMMAPLASTY Bilateral 05/26/05  . BREAST SURGERY     augmentation  . CESAREAN SECTION     x 2  . CYSTOSCOPY  01/11/2015   Procedure: CYSTOSCOPY;  Surgeon: Malachy Mood, MD;  Location: [facility];  Service: Gynecology;;  . LAPAROSCOPIC BILATERAL SALPINGECTOMY Bilateral  01/11/2015   Procedure: LAPAROSCOPIC BILATERAL SALPINGECTOMY;  Surgeon: Malachy Mood, MD;  Location: [facility];  Service: Gynecology;  Laterality: Bilateral;  . LAPAROSCOPIC HYSTERECTOMY N/A 01/11/2015   Procedure: HYSTERECTOMY TOTAL LAPAROSCOPIC;  Surgeon: Malachy Mood, MD;  Location: [facility];  Service: Gynecology;  Laterality: N/A;    Gynecologic History:  Patient's last menstrual period was 01/08/2015 (exact date). Contraception: status post hysterectomy Last Pap: N/A  Family History:  Family History  Problem Relation Age of Onset  . Ovarian cancer Mother 69       DECEASED 2012-05-26  . Cancer Mother 63       LUNG  . Melanoma Father 18    Social History:  Social History   Socioeconomic History  . Marital status: Married    Spouse name: Not on file  . Number of children: Not on file  . Years of education: Not on file  . Highest education level: Not on file  Occupational History  . Not on file  Social Needs  . Financial resource strain: Not on file  . Food insecurity    Worry: Not on file    Inability: Not on file  . Transportation needs    Medical: Not on file    Non-medical: Not on file  Tobacco Use  . Smoking status: Former Smoker    Quit date: 01/02/2006    Years since quitting: 12.8  . Smokeless tobacco: Never Used  Substance and Sexual Activity  . Alcohol use: Yes    Comment:  occ.  . Drug use: No  . Sexual activity: Yes    Birth control/protection: Surgical    Comment: Hysterectomy  Lifestyle  . Physical activity    Days per week: 3 days    Minutes per session: 30 min  . Stress: Not at all  Relationships  . Social Herbalist on phone: Not on file    Gets together: Not on file    Attends religious service: Not on file    Active member of club or organization: Not on file    Attends meetings of clubs or organizations: Not on file    Relationship status: Not on file  . Intimate partner violence    Fear of current or ex partner: Not on  file    Emotionally abused: Not on file    Physically abused: Not on file    Forced sexual activity: Not on file  Other Topics Concern  . Not on file  Social History Narrative  . Not on file    Allergies:  No Known Allergies  Medications: Prior to Admission medications   Medication Sig Start Date End Date Taking? Authorizing Provider  buPROPion (WELLBUTRIN XL) 300 MG 24 hr tablet TAKE 1 TABLET BY MOUTH EVERY DAY 09/14/18   Juline Patch, MD  lisinopril (ZESTRIL) 5 MG tablet TAKE 1 TABLET BY MOUTH EVERY DAY 09/23/18   Juline Patch, MD    Physical Exam Vitals: Last menstrual period 01/08/2015.  No physical exam as this was a remote telephone visit to promote social distancing during the current COVID-19 Pandemic   US Transvaginal Non-ob  Result Date: 10/18/2018 Patient Name: Denise Bolton DOB: 15-Feb-1981 MRN: [id number] ULTRASOUND REPORT Location: [facility] Date of Service: 10/18/2018 Indications:Pelvic Pain Findings: The uterus is absent Right Ovary measures 3.4 x 1.9 x 1.5 cm. It is normal in appearance. Left Ovary is not visible. Survey of the adnexa demonstrates no adnexal masses. There is no free fluid in the cul de sac. Impression: 1. The uterus is absent. 2. Normal appearing right ovary. 3. The left ovary is not well visualized. Recommendations: 1.Clinical correlation with the patient's History and Physical Exam. Gweneth Dimitri, RT Images reviewed.  Normal GYN study without visualized pathology.  Malachy Mood, MD, [facility], [facility] 10/18/2018, 9:43 PM    Assessment: 38 y.o. [id number] No problem-specific Assessment & Plan notes found for this encounter.   Plan: Problem List Items Addressed This Visit    None    Visit Diagnoses    Pelvic pain    -  Primary      1) Pelvic pain - no visualized GYN pathology.   - Encouraged symptom diarry - Low suspicion for endoemtriosis or scar tissue given prior hyst 2016 no evidence of  endometriosis and time frame since surgery, but discussed diagnostic laparoscopy vs trial norethindrone/orilissa  2) Telephone time 8 min  3) Return in about 1 year (around 10/18/2019) for annual.    Malachy Mood, MD, [place], [place]

## 2018-11-11 ENCOUNTER — Other Ambulatory Visit: Payer: Self-pay | Admitting: Family Medicine

## 2018-11-11 DIAGNOSIS — I10 Essential (primary) hypertension: Secondary | ICD-10-CM

## 2018-11-11 DIAGNOSIS — F3341 Major depressive disorder, recurrent, in partial remission: Secondary | ICD-10-CM

## 2018-11-11 MED ORDER — BUPROPION HCL ER (XL) 300 MG PO TB24: 300.0000 mg | ORAL_TABLET | Freq: Every day | ORAL | 0 refills | Status: DC

## 2018-11-11 MED ORDER — LISINOPRIL 5 MG PO TABS: 5.0000 mg | ORAL_TABLET | Freq: Every day | ORAL | 0 refills | Status: DC

## 2018-11-24 ENCOUNTER — Encounter: Payer: Self-pay | Admitting: Family Medicine

## 2018-11-24 ENCOUNTER — Ambulatory Visit: Payer: 59 | Admitting: Family Medicine

## 2018-11-24 ENCOUNTER — Other Ambulatory Visit: Payer: Self-pay

## 2018-11-24 VITALS — BP 138/88 | HR 75 | Ht 65.0 in | Wt 184.0 lb

## 2018-11-24 DIAGNOSIS — I10 Essential (primary) hypertension: Secondary | ICD-10-CM

## 2018-11-24 DIAGNOSIS — F3341 Major depressive disorder, recurrent, in partial remission: Secondary | ICD-10-CM | POA: Diagnosis not present

## 2018-11-24 MED ORDER — BUPROPION HCL ER (XL) 300 MG PO TB24: 300.0000 mg | ORAL_TABLET | Freq: Every day | ORAL | 1 refills | Status: DC

## 2018-11-24 MED ORDER — LISINOPRIL 5 MG PO TABS: 5.0000 mg | ORAL_TABLET | Freq: Every day | ORAL | 1 refills | Status: DC

## 2018-11-24 NOTE — Progress Notes (Signed)
Date:  11/24/2018   Name:  Denise Bolton   DOB:  04-03-1980   MRN:  [id number]   Chief Complaint: Depression (Follow up. PHQ9- 0) and Hypertension (Follow up.)  Depression        This is a new problem.  The current episode started more than 1 year ago.   The onset quality is gradual.   The problem has been gradually improving since onset.  Associated symptoms include no decreased concentration, no fatigue, no helplessness, no hopelessness, does not have insomnia, not irritable, no restlessness, no decreased interest, no appetite change, no body aches, no myalgias, no headaches, no indigestion, not sad and no suicidal ideas.  Past treatments include SSRIs - Selective serotonin reuptake inhibitors.  Compliance with treatment is good. Hypertension This is a chronic problem. The current episode started more than 1 month ago. The problem is unchanged. The problem is controlled. Pertinent negatives include no chest pain, headaches, neck pain, orthopnea, palpitations, PND or shortness of breath.    Review of Systems  Constitutional: Negative for appetite change, chills, fatigue and fever.  HENT: Negative for drooling, ear discharge, ear pain and sore throat.   Respiratory: Negative for cough, shortness of breath and wheezing.   Cardiovascular: Negative for chest pain, palpitations, orthopnea, leg swelling and PND.  Gastrointestinal: Negative for abdominal pain, blood in stool, constipation, diarrhea and nausea.  Endocrine: Negative for polydipsia.  Genitourinary: Negative for dysuria, frequency, hematuria and urgency.  Musculoskeletal: Negative for back pain, myalgias and neck pain.  Skin: Negative for rash.  Allergic/Immunologic: Negative for environmental allergies.  Neurological: Negative for dizziness and headaches.  Hematological: Does not bruise/bleed easily.  Psychiatric/Behavioral: Positive for depression. Negative for decreased concentration and suicidal ideas. The patient is not  nervous/anxious and does not have insomnia.     Patient Active Problem List   Diagnosis Date Noted  . Recurrent major depressive disorder, in partial remission ([place]) 06/22/2017  . S/P laparoscopic hysterectomy 01/11/2015  . Iron deficiency anemia 08/22/2014  . HTN (hypertension) 07/11/2013  . Palpitations 07/11/2013  . Preeclampsia 07/11/2013    No Known Allergies  Past Surgical History:  Procedure Laterality Date  . ABDOMINAL HYSTERECTOMY    . ABDOMINOPLASTY    . ABDOMINOPLASTY  2007   FATTY TISSUE REMOVED  . AUGMENTATION MAMMAPLASTY Bilateral 2007  . BREAST SURGERY     augmentation  . CESAREAN SECTION     x 2  . CYSTOSCOPY  01/11/2015   Procedure: CYSTOSCOPY;  Surgeon: Malachy Mood, MD;  Location: [facility];  Service: Gynecology;;  . LAPAROSCOPIC BILATERAL SALPINGECTOMY Bilateral 01/11/2015   Procedure: LAPAROSCOPIC BILATERAL SALPINGECTOMY;  Surgeon: Malachy Mood, MD;  Location: [facility];  Service: Gynecology;  Laterality: Bilateral;  . LAPAROSCOPIC HYSTERECTOMY N/A 01/11/2015   Procedure: HYSTERECTOMY TOTAL LAPAROSCOPIC;  Surgeon: Malachy Mood, MD;  Location: [facility];  Service: Gynecology;  Laterality: N/A;    Social History   Tobacco Use  . Smoking status: Former Smoker    Quit date: 01/02/2006    Years since quitting: 12.9  . Smokeless tobacco: Never Used  Substance Use Topics  . Alcohol use: Yes    Comment: occ.  . Drug use: No     Medication list has been reviewed and updated.  Current Meds  Medication Sig  . buPROPion (WELLBUTRIN XL) 300 MG 24 hr tablet Take 1 tablet (300 mg total) by mouth daily.  Marland Kitchen lisinopril (ZESTRIL) 5 MG tablet Take 1 tablet (5 mg total) by mouth daily.  PHQ 2/9 Scores 11/24/2018 06/22/2017 10/16/2016  PHQ - 2 Score 0 0 0  PHQ- 9 Score 0 0 4    BP Readings from Last 3 Encounters:  11/24/18 138/88  10/13/18 (!) 150/96  04/27/18 (!) 125/98    Physical Exam Vitals signs and nursing note reviewed.   Constitutional:      General: She is not irritable.She is not in acute distress.    Appearance: She is not diaphoretic.  HENT:     Head: Normocephalic and atraumatic.     Right Ear: Tympanic membrane, ear canal and external ear normal.     Left Ear: Tympanic membrane, ear canal and external ear normal.     Nose: Nose normal.     Mouth/Throat:     Mouth: Mucous membranes are moist.  Eyes:     General:        Right eye: No discharge.        Left eye: No discharge.     Conjunctiva/sclera: Conjunctivae normal.     Pupils: Pupils are equal, round, and reactive to light.  Neck:     Musculoskeletal: Normal range of motion and neck supple.     Thyroid: No thyromegaly.     Vascular: No JVD.  Cardiovascular:     Rate and Rhythm: Normal rate and regular rhythm.     Heart sounds: Normal heart sounds. No murmur. No friction rub. No gallop.   Pulmonary:     Effort: Pulmonary effort is normal.     Breath sounds: Normal breath sounds.  Abdominal:     General: Bowel sounds are normal.     Palpations: Abdomen is soft. There is no mass.     Tenderness: There is no abdominal tenderness. There is no right CVA tenderness, left CVA tenderness or guarding.  Musculoskeletal: Normal range of motion.  Lymphadenopathy:     Cervical: No cervical adenopathy.  Skin:    General: Skin is warm and dry.  Neurological:     Mental Status: She is alert.     Deep Tendon Reflexes: Reflexes are normal and symmetric.     Wt Readings from Last 3 Encounters:  11/24/18 184 lb (83.5 kg)  10/13/18 177 lb (80.3 kg)  04/27/18 170 lb (77.1 kg)    BP 138/88   Pulse 75   Ht 5\' 5"  (1.651 m)   Wt 184 lb (83.5 kg)   LMP 01/08/2015 (Exact Date)   SpO2 98%   BMI 30.62 kg/m   Assessment and Plan: 1. Essential hypertension Controlled.  Chronic.  Continue lisinopril 5 mg once a day.  Will check renal function panel. - lisinopril (ZESTRIL) 5 MG tablet; Take 1 tablet (5 mg total) by mouth daily.  Dispense: 90 tablet;  Refill: 1 - Renal Function Panel  2. Recurrent major depressive disorder, in partial remission (HCC) Chronic.  Controlled.  PHQ 0.  Continue bupropion XL 301 a day. - buPROPion (WELLBUTRIN XL) 300 MG 24 hr tablet; Take 1 tablet (300 mg total) by mouth daily.  Dispense: 90 tablet; Refill: 1

## 2018-11-25 LAB — RENAL FUNCTION PANEL
Albumin: 4.3 g/dL (ref 3.8–4.8)
BUN/Creatinine Ratio: 24 — ABNORMAL HIGH (ref 9–23)
BUN: 16 mg/dL (ref 6–24)
CO2: 24 mmol/L (ref 20–29)
Calcium: 9.3 mg/dL (ref 8.7–10.2)
Chloride: 104 mmol/L (ref 96–106)
Creatinine, Ser: 0.66 mg/dL (ref 0.57–1.00)
GFR calc Af Amer: 128 mL/min/{1.73_m2} (ref >59)
GFR calc non Af Amer: 111 mL/min/{1.73_m2} (ref >59)
Glucose: 86 mg/dL (ref 65–99)
Phosphorus: 3.7 mg/dL (ref 3.0–4.3)
Potassium: 4.3 mmol/L (ref 3.5–5.2)
Sodium: 141 mmol/L (ref 134–144)

## 2018-12-13 ENCOUNTER — Other Ambulatory Visit: Payer: Self-pay

## 2018-12-13 DIAGNOSIS — Z20822 Contact with and (suspected) exposure to covid-19: Secondary | ICD-10-CM

## 2019-03-07 ENCOUNTER — Encounter: Payer: Self-pay | Admitting: Family Medicine

## 2019-03-09 DIAGNOSIS — Z7189 Other specified counseling: Secondary | ICD-10-CM | POA: Diagnosis not present

## 2019-03-09 DIAGNOSIS — L738 Other specified follicular disorders: Secondary | ICD-10-CM | POA: Diagnosis not present

## 2019-03-09 DIAGNOSIS — L718 Other rosacea: Secondary | ICD-10-CM | POA: Diagnosis not present

## 2019-03-09 DIAGNOSIS — L821 Other seborrheic keratosis: Secondary | ICD-10-CM | POA: Diagnosis not present

## 2019-03-09 DIAGNOSIS — D225 Melanocytic nevi of trunk: Secondary | ICD-10-CM | POA: Diagnosis not present

## 2019-03-09 DIAGNOSIS — Z09 Encounter for follow-up examination after completed treatment for conditions other than malignant neoplasm: Secondary | ICD-10-CM | POA: Diagnosis not present

## 2019-03-09 DIAGNOSIS — L814 Other melanin hyperpigmentation: Secondary | ICD-10-CM | POA: Diagnosis not present

## 2019-03-09 DIAGNOSIS — Z872 Personal history of diseases of the skin and subcutaneous tissue: Secondary | ICD-10-CM | POA: Diagnosis not present

## 2019-03-09 DIAGNOSIS — L720 Epidermal cyst: Secondary | ICD-10-CM | POA: Diagnosis not present

## 2019-04-08 ENCOUNTER — Ambulatory Visit (INDEPENDENT_AMBULATORY_CARE_PROVIDER_SITE_OTHER): Payer: 59 | Admitting: Family Medicine

## 2019-04-08 ENCOUNTER — Other Ambulatory Visit: Payer: Self-pay

## 2019-04-08 ENCOUNTER — Encounter: Payer: Self-pay | Admitting: Family Medicine

## 2019-04-08 VITALS — BP 120/80 | HR 80 | Ht 65.0 in | Wt 189.0 lb

## 2019-04-08 DIAGNOSIS — Z0000 Encounter for general adult medical examination without abnormal findings: Secondary | ICD-10-CM | POA: Diagnosis not present

## 2019-04-08 DIAGNOSIS — E663 Overweight: Secondary | ICD-10-CM | POA: Diagnosis not present

## 2019-04-08 NOTE — Progress Notes (Signed)
Date:  04/08/2019   Name:  Denise Bolton   DOB:  10/19/1980   MRN:  [id number]   Chief Complaint: Employment Physical (needs labs for ins at work)  Patient is a 39 year old female who presents for a comprehensive physical exam. The patient reports the following problems: none. Health maintenance has been reviewed up to date.   Lab Results  Component Value Date   CREATININE 0.66 11/24/2018   BUN 16 11/24/2018   NA 141 11/24/2018   K 4.3 11/24/2018   CL 104 11/24/2018   CO2 24 11/24/2018   Lab Results  Component Value Date   CHOL 212 (H) 06/22/2017   HDL 79 06/22/2017   LDLCALC 121 (H) 06/22/2017   TRIG 61 06/22/2017   CHOLHDL 2.7 06/22/2017   No results found for: TSH No results found for: HGBA1C   Review of Systems  Constitutional: Negative.  Negative for chills, fatigue, fever and unexpected weight change.  HENT: Negative for congestion, ear discharge, ear pain, rhinorrhea, sinus pressure, sneezing and sore throat.   Eyes: Negative for photophobia, pain, discharge, redness and itching.  Respiratory: Negative for cough, shortness of breath, wheezing and stridor.   Gastrointestinal: Negative for abdominal pain, blood in stool, constipation, diarrhea, nausea and vomiting.  Endocrine: Negative for cold intolerance, heat intolerance, polydipsia, polyphagia and polyuria.  Genitourinary: Negative for dysuria, flank pain, frequency, hematuria, menstrual problem, pelvic pain, urgency, vaginal bleeding and vaginal discharge.  Musculoskeletal: Negative for arthralgias, back pain and myalgias.  Skin: Negative for rash.  Allergic/Immunologic: Negative for environmental allergies and food allergies.  Neurological: Negative for dizziness, weakness, light-headedness, numbness and headaches.  Hematological: Negative for adenopathy. Does not bruise/bleed easily.  Psychiatric/Behavioral: Negative for dysphoric mood. The patient is not nervous/anxious.     Patient Active Problem List    Diagnosis Date Noted  . Recurrent major depressive disorder, in partial remission ([place]) 06/22/2017  . S/P laparoscopic hysterectomy 01/11/2015  . Iron deficiency anemia 08/22/2014  . HTN (hypertension) 07/11/2013  . Palpitations 07/11/2013  . Preeclampsia 07/11/2013    No Known Allergies  Past Surgical History:  Procedure Laterality Date  . ABDOMINAL HYSTERECTOMY    . ABDOMINOPLASTY    . ABDOMINOPLASTY  2007   FATTY TISSUE REMOVED  . AUGMENTATION MAMMAPLASTY Bilateral 2007  . BREAST SURGERY     augmentation  . CESAREAN SECTION     x 2  . CYSTOSCOPY  01/11/2015   Procedure: CYSTOSCOPY;  Surgeon: Malachy Mood, MD;  Location: [facility];  Service: Gynecology;;  . LAPAROSCOPIC BILATERAL SALPINGECTOMY Bilateral 01/11/2015   Procedure: LAPAROSCOPIC BILATERAL SALPINGECTOMY;  Surgeon: Malachy Mood, MD;  Location: [facility];  Service: Gynecology;  Laterality: Bilateral;  . LAPAROSCOPIC HYSTERECTOMY N/A 01/11/2015   Procedure: HYSTERECTOMY TOTAL LAPAROSCOPIC;  Surgeon: Malachy Mood, MD;  Location: [facility];  Service: Gynecology;  Laterality: N/A;    Social History   Tobacco Use  . Smoking status: Former Smoker    Quit date: 01/02/2006    Years since quitting: 13.2  . Smokeless tobacco: Never Used  Substance Use Topics  . Alcohol use: Yes    Comment: occ.  . Drug use: No     Medication list has been reviewed and updated.  Current Meds  Medication Sig  . buPROPion (WELLBUTRIN XL) 300 MG 24 hr tablet Take 1 tablet (300 mg total) by mouth daily.  Marland Kitchen lisinopril (ZESTRIL) 5 MG tablet Take 1 tablet (5 mg total) by mouth daily.    PHQ 2/9  Scores 04/08/2019 11/24/2018 06/22/2017 10/16/2016  PHQ - 2 Score 0 0 0 0  PHQ- 9 Score 0 0 0 4    BP Readings from Last 3 Encounters:  04/08/19 120/80  11/24/18 138/88  10/13/18 (!) 150/96    Physical Exam Vitals and nursing note reviewed.  Constitutional:      General: She is not in acute distress.    Appearance: Normal  appearance. She is well-groomed and overweight. She is not diaphoretic.  HENT:     Head: Normocephalic and atraumatic.     Jaw: There is normal jaw occlusion.     Right Ear: Hearing, tympanic membrane, ear canal and external ear normal.     Left Ear: Hearing, tympanic membrane, ear canal and external ear normal.     Nose: Nose normal. No congestion or rhinorrhea.     Mouth/Throat:     Lips: Pink.     Mouth: Mucous membranes are moist.     Dentition: Normal dentition.     Tongue: No lesions.     Pharynx: Oropharynx is clear. Uvula midline.  Eyes:     General: Lids are normal. Vision grossly intact. Gaze aligned appropriately.        Right eye: No discharge.        Left eye: No discharge.     Extraocular Movements: Extraocular movements intact.     Conjunctiva/sclera: Conjunctivae normal.     Pupils: Pupils are equal, round, and reactive to light.     Funduscopic exam:    Right eye: Red reflex present.        Left eye: Red reflex present. Neck:     Thyroid: No thyroid mass, thyromegaly or thyroid tenderness.     Vascular: Normal carotid pulses. No carotid bruit, hepatojugular reflux or JVD.     Trachea: Trachea and phonation normal.  Cardiovascular:     Rate and Rhythm: Normal rate and regular rhythm.     Chest Wall: PMI is not displaced. No thrill.     Pulses: Normal pulses. No decreased pulses.          Carotid pulses are 2+ on the right side and 2+ on the left side.      Radial pulses are 2+ on the right side and 2+ on the left side.       Femoral pulses are 2+ on the right side and 2+ on the left side.      Popliteal pulses are 2+ on the right side and 2+ on the left side.       Dorsalis pedis pulses are 2+ on the right side and 2+ on the left side.       Posterior tibial pulses are 2+ on the right side and 2+ on the left side.     Heart sounds: Normal heart sounds, S1 normal and S2 normal. No murmur. No systolic murmur. No diastolic murmur. No friction rub. No gallop. No S3 or  S4 sounds.   Pulmonary:     Effort: Pulmonary effort is normal.     Breath sounds: Normal breath sounds. No decreased air movement. No decreased breath sounds, wheezing, rhonchi or rales.  Abdominal:     General: Bowel sounds are normal.     Palpations: Abdomen is soft. There is no hepatomegaly, splenomegaly or mass.     Tenderness: There is no abdominal tenderness. There is no guarding or rebound.  Musculoskeletal:        General: Normal range of motion.  Cervical back: Normal range of motion and neck supple.  Feet:     Right foot:     Skin integrity: Skin integrity normal.     Left foot:     Skin integrity: Skin integrity normal.  Lymphadenopathy:     Head:     Right side of head: No submental, submandibular or tonsillar adenopathy.     Left side of head: No submental, submandibular or tonsillar adenopathy.     Cervical: No cervical adenopathy.     Right cervical: No superficial, deep or posterior cervical adenopathy.    Left cervical: No superficial, deep or posterior cervical adenopathy.     Upper Body:     Right upper body: No supraclavicular or axillary adenopathy.     Left upper body: No supraclavicular or axillary adenopathy.  Skin:    General: Skin is warm and dry.     Capillary Refill: Capillary refill takes less than 2 seconds.  Neurological:     Mental Status: She is alert.     Cranial Nerves: Cranial nerves are intact.     Sensory: Sensation is intact.     Motor: Motor function is intact.     Deep Tendon Reflexes: Reflexes are normal and symmetric.     Reflex Scores:      Tricep reflexes are 2+ on the right side and 2+ on the left side.      Bicep reflexes are 2+ on the right side and 2+ on the left side.      Brachioradialis reflexes are 2+ on the right side and 2+ on the left side.      Patellar reflexes are 2+ on the right side and 2+ on the left side.      Achilles reflexes are 2+ on the right side and 2+ on the left side.    Wt Readings from Last 3  Encounters:  04/08/19 189 lb (85.7 kg)  11/24/18 184 lb (83.5 kg)  10/13/18 177 lb (80.3 kg)    BP 120/80   Pulse 80   Ht 5\' 5"  (1.651 m)   Wt 189 lb (85.7 kg)   LMP 01/08/2015 (Exact Date)   BMI 31.45 kg/m   Assessment and Plan: 1. Encounter for annual physical examination excluding gynecological examination in a patient older than 17 years No subjective objective concerns noted during history and physical exam.  Previous encounters as well as most recent labs, imaging, and care elsewhere reviewed.Denise Bolton is a 38 y.o. female who presents today for her Complete Annual Exam. She feels fairly well. She reports exercising . She reports she is sleeping well. Immunizations are reviewed and recommendations provided.   Age appropriate screening tests are discussed. Counseling given for risk factor reduction interventions.  We will obtain lipid panel as well as a CMP. - Lipid Panel With LDL/HDL Ratio - Comprehensive Metabolic Panel (CMET)  2. Overweight (BMI 25.0-29.9) Health risks of being over weight were discussed and patient was counseled on weight loss options and exercise.  Patient was given Mediterranean diet for guidelines and for weight loss purposes.

## 2019-04-08 NOTE — Patient Instructions (Signed)

## 2019-04-09 LAB — LIPID PANEL WITH LDL/HDL RATIO
Cholesterol, Total: 188 mg/dL (ref 100–199)
HDL: 69 mg/dL (ref >39)
LDL Chol Calc (NIH): 108 mg/dL — ABNORMAL HIGH (ref 0–99)
LDL/HDL Ratio: 1.6 ratio (ref 0.0–3.2)
Triglycerides: 59 mg/dL (ref 0–149)
VLDL Cholesterol Cal: 11 mg/dL (ref 5–40)

## 2019-04-09 LAB — COMPREHENSIVE METABOLIC PANEL WITH GFR
ALT: 18 IU/L (ref 0–32)
AST: 16 IU/L (ref 0–40)
Albumin/Globulin Ratio: 2 (ref 1.2–2.2)
Albumin: 4 g/dL (ref 3.8–4.8)
Alkaline Phosphatase: 64 IU/L (ref 39–117)
BUN/Creatinine Ratio: 16 (ref 9–23)
BUN: 12 mg/dL (ref 6–24)
Bilirubin Total: 0.5 mg/dL (ref 0.0–1.2)
CO2: 20 mmol/L (ref 20–29)
Calcium: 9.2 mg/dL (ref 8.7–10.2)
Chloride: 107 mmol/L — ABNORMAL HIGH (ref 96–106)
Creatinine, Ser: 0.74 mg/dL (ref 0.57–1.00)
GFR calc Af Amer: 117 mL/min/1.73
GFR calc non Af Amer: 102 mL/min/1.73
Globulin, Total: 2 g/dL (ref 1.5–4.5)
Glucose: 85 mg/dL (ref 65–99)
Potassium: 4.9 mmol/L (ref 3.5–5.2)
Sodium: 141 mmol/L (ref 134–144)
Total Protein: 6 g/dL (ref 6.0–8.5)

## 2019-05-10 ENCOUNTER — Encounter: Payer: Self-pay | Admitting: Family Medicine

## 2019-05-10 ENCOUNTER — Ambulatory Visit: Payer: 59 | Admitting: Family Medicine

## 2019-05-10 ENCOUNTER — Other Ambulatory Visit: Payer: Self-pay

## 2019-05-10 DIAGNOSIS — F3341 Major depressive disorder, recurrent, in partial remission: Secondary | ICD-10-CM | POA: Diagnosis not present

## 2019-05-10 MED ORDER — BUPROPION HCL ER (XL) 300 MG PO TB24: 300.0000 mg | ORAL_TABLET | Freq: Every day | ORAL | 1 refills | Status: DC

## 2019-05-10 NOTE — Progress Notes (Signed)
Date:  05/10/2019   Name:  Denise Bolton   DOB:  December 09, 1980   MRN:  [id number]   Chief Complaint: Hypertension and Depression  Hypertension This is a chronic problem. The current episode started more than 1 year ago. The problem is unchanged. The problem is controlled. Pertinent negatives include no anxiety, blurred vision, chest pain, headaches, malaise/fatigue, neck pain, orthopnea, palpitations, peripheral edema, PND, shortness of breath or sweats. Past treatments include ACE inhibitors. The current treatment provides mild improvement. There are no compliance problems.  There is no history of angina, kidney disease, CAD/MI, CVA, heart failure, left ventricular hypertrophy, PVD or retinopathy. There is no history of chronic renal disease, a hypertension causing med or renovascular disease.  Depression        This is a chronic problem.  The current episode started more than 1 year ago.   The problem has been gradually improving since onset.  Associated symptoms include no decreased concentration, no fatigue, no helplessness, no hopelessness, does not have insomnia, not irritable, no restlessness, no decreased interest, no appetite change, no body aches, no myalgias, no headaches, no indigestion, not sad and no suicidal ideas.  Past treatments include SSRIs - Selective serotonin reuptake inhibitors.   Pertinent negatives include no anxiety.   Lab Results  Component Value Date   CREATININE 0.74 04/08/2019   BUN 12 04/08/2019   NA 141 04/08/2019   K 4.9 04/08/2019   CL 107 (H) 04/08/2019   CO2 20 04/08/2019   Lab Results  Component Value Date   CHOL 188 04/08/2019   HDL 69 04/08/2019   LDLCALC 108 (H) 04/08/2019   TRIG 59 04/08/2019   CHOLHDL 2.7 06/22/2017   No results found for: TSH No results found for: HGBA1C   Review of Systems  Constitutional: Negative.  Negative for appetite change, chills, fatigue, fever, malaise/fatigue and unexpected weight change.  HENT: Negative for  congestion, ear discharge, ear pain, rhinorrhea, sinus pressure, sneezing and sore throat.   Eyes: Negative for blurred vision, photophobia, pain, discharge, redness and itching.  Respiratory: Negative for cough, shortness of breath, wheezing and stridor.   Cardiovascular: Negative for chest pain, palpitations, orthopnea and PND.  Gastrointestinal: Negative for abdominal pain, blood in stool, constipation, diarrhea, nausea and vomiting.  Endocrine: Negative for cold intolerance, heat intolerance, polydipsia, polyphagia and polyuria.  Genitourinary: Negative for dysuria, flank pain, frequency, hematuria, menstrual problem, pelvic pain, urgency, vaginal bleeding and vaginal discharge.  Musculoskeletal: Negative for arthralgias, back pain, myalgias and neck pain.  Skin: Negative for rash.  Allergic/Immunologic: Negative for environmental allergies and food allergies.  Neurological: Negative for dizziness, weakness, light-headedness, numbness and headaches.  Hematological: Negative for adenopathy. Does not bruise/bleed easily.  Psychiatric/Behavioral: Positive for depression. Negative for decreased concentration, dysphoric mood and suicidal ideas. The patient is not nervous/anxious and does not have insomnia.     Patient Active Problem List   Diagnosis Date Noted  . Annual physical exam 04/08/2019  . Recurrent major depressive disorder, in partial remission ([place]) 06/22/2017  . S/P laparoscopic hysterectomy 01/11/2015  . Iron deficiency anemia 08/22/2014  . HTN (hypertension) 07/11/2013  . Palpitations 07/11/2013  . Preeclampsia 07/11/2013    No Known Allergies  Past Surgical History:  Procedure Laterality Date  . ABDOMINAL HYSTERECTOMY    . ABDOMINOPLASTY    . ABDOMINOPLASTY  2007   FATTY TISSUE REMOVED  . AUGMENTATION MAMMAPLASTY Bilateral 2007  . BREAST SURGERY     augmentation  . CESAREAN SECTION  x 2  . CYSTOSCOPY  01/11/2015   Procedure: CYSTOSCOPY;  Surgeon: Malachy Mood, MD;  Location: [facility];  Service: Gynecology;;  . LAPAROSCOPIC BILATERAL SALPINGECTOMY Bilateral 01/11/2015   Procedure: LAPAROSCOPIC BILATERAL SALPINGECTOMY;  Surgeon: Malachy Mood, MD;  Location: [facility];  Service: Gynecology;  Laterality: Bilateral;  . LAPAROSCOPIC HYSTERECTOMY N/A 01/11/2015   Procedure: HYSTERECTOMY TOTAL LAPAROSCOPIC;  Surgeon: Malachy Mood, MD;  Location: [facility];  Service: Gynecology;  Laterality: N/A;    Social History   Tobacco Use  . Smoking status: Former Smoker    Quit date: 01/02/2006    Years since quitting: 13.3  . Smokeless tobacco: Never Used  Substance Use Topics  . Alcohol use: Yes    Comment: occ.  . Drug use: No     Medication list has been reviewed and updated.  Current Meds  Medication Sig  . buPROPion (WELLBUTRIN XL) 300 MG 24 hr tablet Take 1 tablet (300 mg total) by mouth daily.  Marland Kitchen lisinopril (ZESTRIL) 5 MG tablet Take 1 tablet (5 mg total) by mouth daily.    PHQ 2/9 Scores 05/10/2019 04/08/2019 11/24/2018 06/22/2017  PHQ - 2 Score 0 0 0 0  PHQ- 9 Score 0 0 0 0    BP Readings from Last 3 Encounters:  05/10/19 100/62  04/08/19 120/80  11/24/18 138/88    Physical Exam Vitals and nursing note reviewed.  Constitutional:      General: She is not irritable.    Appearance: She is well-developed.  HENT:     Head: Normocephalic.     Right Ear: Tympanic membrane, ear canal and external ear normal. There is no impacted cerumen.     Left Ear: Tympanic membrane, ear canal and external ear normal. There is no impacted cerumen.     Nose: Nose normal.     Mouth/Throat:     Mouth: Mucous membranes are moist.  Eyes:     General: Lids are everted, no foreign bodies appreciated. No scleral icterus.       Left eye: No foreign body or hordeolum.     Conjunctiva/sclera: Conjunctivae normal.     Right eye: Right conjunctiva is not injected.     Left eye: Left conjunctiva is not injected.     Pupils: Pupils are equal, round,  and reactive to light.  Neck:     Thyroid: No thyromegaly.     Vascular: No JVD.     Trachea: No tracheal deviation.  Cardiovascular:     Rate and Rhythm: Normal rate and regular rhythm.     Heart sounds: Normal heart sounds. No murmur. No friction rub. No gallop.   Pulmonary:     Effort: Pulmonary effort is normal. No respiratory distress.     Breath sounds: Normal breath sounds. No wheezing, rhonchi or rales.  Abdominal:     General: Bowel sounds are normal.     Palpations: Abdomen is soft. There is no mass.     Tenderness: There is no abdominal tenderness. There is no guarding or rebound.  Musculoskeletal:        General: No tenderness. Normal range of motion.     Cervical back: Normal range of motion and neck supple.  Lymphadenopathy:     Cervical: No cervical adenopathy.  Skin:    General: Skin is warm.     Findings: No rash.  Neurological:     Mental Status: She is alert and oriented to person, place, and time.     Cranial Nerves: No  cranial nerve deficit.     Deep Tendon Reflexes: Reflexes normal.  Psychiatric:        Mood and Affect: Mood is not anxious or depressed.     Wt Readings from Last 3 Encounters:  05/10/19 192 lb (87.1 kg)  04/08/19 189 lb (85.7 kg)  11/24/18 184 lb (83.5 kg)    BP 100/62   Pulse 76   Ht 5\' 5"  (1.651 m)   Wt 192 lb (87.1 kg)   LMP 01/08/2015 (Exact Date)   BMI 31.95 kg/m   Assessment and Plan:  1. Recurrent major depressive disorder, in partial remission ([facility]) Chronic.  Stable.  Trolled.  PHQ 0 and gad score 0 we will continue bupropion XL 300 mg daily.  We will recheck patient in 6 months. - buPROPion (WELLBUTRIN XL) 300 MG 24 hr tablet; Take 1 tablet (300 mg total) by mouth daily.  Dispense: 90 tablet; Refill: 1  Patient's blood pressure has been very good over the course the last 2 to 3 years on the current regimen of lisinopril 5 mg.  Patient has been tapered down to this point and blood pressure today was 100/62.  We will  discontinue the lisinopril and patient has means of checking her blood pressure at work and should this climb over 120 and 80 we will consider resuming medication but at this time we will just watch her dietary sodium intake.

## 2019-10-25 ENCOUNTER — Encounter: Payer: Self-pay | Admitting: Family Medicine

## 2019-10-26 ENCOUNTER — Ambulatory Visit (INDEPENDENT_AMBULATORY_CARE_PROVIDER_SITE_OTHER): Payer: 59 | Admitting: Obstetrics and Gynecology

## 2019-10-26 ENCOUNTER — Encounter: Payer: Self-pay | Admitting: Obstetrics and Gynecology

## 2019-10-26 ENCOUNTER — Other Ambulatory Visit: Payer: Self-pay

## 2019-10-26 VITALS — BP 156/101 | HR 84 | Ht 65.0 in | Wt 191.0 lb

## 2019-10-26 DIAGNOSIS — Z1239 Encounter for other screening for malignant neoplasm of breast: Secondary | ICD-10-CM | POA: Diagnosis not present

## 2019-10-26 DIAGNOSIS — Z01419 Encounter for gynecological examination (general) (routine) without abnormal findings: Secondary | ICD-10-CM

## 2019-10-26 NOTE — Patient Instructions (Signed)
[facility] [address] [place] [address]  [facility]  [address] [place] [address]  Phone: [phone]  

## 2019-10-26 NOTE — Progress Notes (Signed)
Gynecology Annual Exam  PCP: Juline Patch, MD  Chief Complaint:  Chief Complaint  Patient presents with  . Gynecologic Exam    History of Present Illness: Patient is a 39 y.o. [id number] presents for annual exam. The patient has no complaints today.   LMP: Patient's last menstrual period was 01/08/2015 (exact date). Patient status post prior hysterectomy  The patient is sexually active. She currently uses status post hysterectomy for contraception. She denies dyspareunia.  The patient does perform self breast exams.  There is no notable family history of breast or ovarian cancer in her family.  The patient wears seatbelts: yes.   The patient has regular exercise: not asked.    The patient denies current symptoms of depression.    Review of Systems: Review of Systems  Constitutional: Negative for chills and fever.  HENT: Negative for congestion.   Respiratory: Negative for cough and shortness of breath.   Cardiovascular: Negative for chest pain and palpitations.  Gastrointestinal: Negative for abdominal pain, constipation, diarrhea, heartburn, nausea and vomiting.  Genitourinary: Negative for dysuria, frequency and urgency.  Skin: Negative for itching and rash.  Neurological: Negative for dizziness and headaches.  Endo/Heme/Allergies: Negative for polydipsia.  Psychiatric/Behavioral: Negative for depression.    Past Medical History:  Patient Active Problem List   Diagnosis Date Noted  . Annual physical exam 04/08/2019  . Recurrent major depressive disorder, in partial remission ([place]) 06/22/2017  . S/P laparoscopic hysterectomy 01/11/2015  . Iron deficiency anemia 08/22/2014  . Palpitations 07/11/2013  . Preeclampsia 07/11/2013    Past Surgical History:  Past Surgical History:  Procedure Laterality Date  . ABDOMINAL HYSTERECTOMY    . ABDOMINOPLASTY    . ABDOMINOPLASTY  2005-06-15   FATTY TISSUE REMOVED  . AUGMENTATION MAMMAPLASTY Bilateral 2005/06/15  . BREAST SURGERY      augmentation  . CESAREAN SECTION     x 2  . CYSTOSCOPY  01/11/2015   Procedure: CYSTOSCOPY;  Surgeon: Malachy Mood, MD;  Location: [facility];  Service: Gynecology;;  . LAPAROSCOPIC BILATERAL SALPINGECTOMY Bilateral 01/11/2015   Procedure: LAPAROSCOPIC BILATERAL SALPINGECTOMY;  Surgeon: Malachy Mood, MD;  Location: [facility];  Service: Gynecology;  Laterality: Bilateral;  . LAPAROSCOPIC HYSTERECTOMY N/A 01/11/2015   Procedure: HYSTERECTOMY TOTAL LAPAROSCOPIC;  Surgeon: Malachy Mood, MD;  Location: [facility];  Service: Gynecology;  Laterality: N/A;    Gynecologic History:  Patient's last menstrual period was 01/08/2015 (exact date). Contraception: status post hysterectomy Last Pap: Results were: N/A s/p hysterectomy Last mammogram: 09/02/2018 Results were: BI-RAD III follow up 1 year  Obstetric History: G2P2002  Family History:  Family History  Problem Relation Age of Onset  . Ovarian cancer Mother 24       DECEASED 06/15/2012  . Cancer Mother 26       LUNG  . Melanoma Father 51    Social History:  Social History   Socioeconomic History  . Marital status: Married    Spouse name: Not on file  . Number of children: Not on file  . Years of education: Not on file  . Highest education level: Not on file  Occupational History  . Not on file  Tobacco Use  . Smoking status: Former Smoker    Quit date: 01/02/2006    Years since quitting: 13.8  . Smokeless tobacco: Never Used  Vaping Use  . Vaping Use: Never used  Substance and Sexual Activity  . Alcohol use: Yes    Comment: occ.  . Drug use: No  .  Sexual activity: Yes    Birth control/protection: Surgical    Comment: Hysterectomy  Other Topics Concern  . Not on file  Social History Narrative  . Not on file   Social Determinants of Health   Financial Resource Strain:   . Difficulty of Paying Living Expenses: Not on file  Food Insecurity:   . Worried About Charity fundraiser in the Last Year: Not on file  . Ran  Out of Food in the Last Year: Not on file  Transportation Needs:   . Lack of Transportation (Medical): Not on file  . Lack of Transportation (Non-Medical): Not on file  Physical Activity:   . Days of Exercise per Week: Not on file  . Minutes of Exercise per Session: Not on file  Stress:   . Feeling of Stress : Not on file  Social Connections:   . Frequency of Communication with Friends and Family: Not on file  . Frequency of Social Gatherings with Friends and Family: Not on file  . Attends Religious Services: Not on file  . Active Member of Clubs or Organizations: Not on file  . Attends Archivist Meetings: Not on file  . Marital Status: Not on file  Intimate Partner Violence:   . Fear of Current or Ex-Partner: Not on file  . Emotionally Abused: Not on file  . Physically Abused: Not on file  . Sexually Abused: Not on file    Allergies:  No Known Allergies  Medications: Prior to Admission medications   Medication Sig Start Date End Date Taking? Authorizing Provider  buPROPion (WELLBUTRIN XL) 300 MG 24 hr tablet Take 1 tablet (300 mg total) by mouth daily. 05/10/19  Yes Juline Patch, MD    Physical Exam Vitals: Blood pressure (!) 156/101, pulse 84, height 5\' 5"  (1.651 m), weight 191 lb (86.6 kg), last menstrual period 01/08/2015.  General: NAD HEENT: normocephalic, anicteric Thyroid: no enlargement, no palpable nodules Pulmonary: No increased work of breathing, CTAB Cardiovascular: RRR, distal pulses 2+ Breast: Breast symmetrical, no tenderness, no palpable nodules or masses, no skin or nipple retraction present, no nipple discharge.  No axillary or supraclavicular lymphadenopathy. Abdomen: NABS, soft, non-tender, non-distended.  Umbilicus without lesions.  No hepatomegaly, splenomegaly or masses palpable. No evidence of hernia  Genitourinary:  External: Normal external female genitalia.  Normal urethral meatus, normal Bartholin's and Skene's glands.    Vagina:  Normal vaginal mucosa, no evidence of prolapse.    Cervix: surgically absent  Uterus: surgically absent  Adnexa: ovaries non-enlarged, no adnexal masses  Rectal: deferred  Lymphatic: no evidence of inguinal lymphadenopathy Extremities: no edema, erythema, or tenderness Neurologic: Grossly intact Psychiatric: mood appropriate, affect full  Female chaperone present for pelvic and breast  portions of the physical exam    Assessment: 39 y.o. G2P2002 routine annual exam  Plan: Problem List Items Addressed This Visit    None    Visit Diagnoses    Encounter for gynecological examination without abnormal finding    -  Primary   Breast screening       Relevant Orders   MM 3D SCREEN BREAST BILATERAL      1) Mammogram - recommend yearly screening mammogram.  Mammogram Was ordered today   2) STI screening  was notoffered and therefore not obtained  3) ASCCP guidelines and rational discussed.  Patient opts for discontinue secondary to prior hysterectomy screening interval  4) Contraception - the patient is currently using  status post hysterectomy.  She is  not currently in need of contraception secondary to being sterile  5) Routine healthcare maintenance including cholesterol, diabetes screening discussed managed by PCP  6) Return in about 1 year (around 10/25/2020) for annual.   Malachy Mood, MD, [place] 10/26/2019, 4:03 PM

## 2019-10-27 ENCOUNTER — Ambulatory Visit: Payer: 59 | Admitting: Family Medicine

## 2019-10-27 ENCOUNTER — Encounter: Payer: Self-pay | Admitting: Family Medicine

## 2019-10-27 VITALS — BP 130/80 | HR 82 | Ht 65.0 in | Wt 191.0 lb

## 2019-10-27 DIAGNOSIS — F3341 Major depressive disorder, recurrent, in partial remission: Secondary | ICD-10-CM

## 2019-10-27 MED ORDER — BUPROPION HCL ER (XL) 150 MG PO TB24: 150.0000 mg | ORAL_TABLET | Freq: Every day | ORAL | 1 refills | Status: DC

## 2019-10-27 NOTE — Progress Notes (Signed)
Date:  10/27/2019   Name:  Denise Bolton   DOB:  1981/01/08   MRN:  [id number]   Chief Complaint: Medication Dose Change (pt would like to decrease to 150mg )  Depression        This is a chronic problem.  The current episode started more than 1 year ago.   The onset quality is gradual.   The problem occurs intermittently.  The problem has been gradually improving since onset.  Associated symptoms include no decreased concentration, no fatigue, no helplessness, no hopelessness, does not have insomnia, not irritable, no restlessness, no decreased interest, no appetite change, no body aches, no myalgias, no headaches, no indigestion, not sad and no suicidal ideas.  Past treatments include other medications (bupropion).  Compliance with treatment is good.  Previous treatment provided mild relief.   Lab Results  Component Value Date   CREATININE 0.74 04/08/2019   BUN 12 04/08/2019   NA 141 04/08/2019   K 4.9 04/08/2019   CL 107 (H) 04/08/2019   CO2 20 04/08/2019   Lab Results  Component Value Date   CHOL 188 04/08/2019   HDL 69 04/08/2019   LDLCALC 108 (H) 04/08/2019   TRIG 59 04/08/2019   CHOLHDL 2.7 06/22/2017   No results found for: TSH No results found for: HGBA1C Lab Results  Component Value Date   WBC 7.2 01/12/2015   HGB 10.4 (L) 01/12/2015   HCT 31.7 (L) 01/12/2015   MCV 91.9 01/12/2015   PLT 243 01/12/2015   Lab Results  Component Value Date   ALT 18 04/08/2019   AST 16 04/08/2019   ALKPHOS 64 04/08/2019   BILITOT 0.5 04/08/2019     Review of Systems  Constitutional: Negative.  Negative for appetite change, chills, fatigue, fever and unexpected weight change.  HENT: Negative for congestion, ear discharge, ear pain, hearing loss, mouth sores, nosebleeds, postnasal drip, rhinorrhea, sinus pressure, sneezing and sore throat.   Eyes: Negative for photophobia, pain, discharge, redness and itching.  Respiratory: Negative for cough, shortness of breath, wheezing  and stridor.   Cardiovascular: Negative for chest pain, palpitations and leg swelling.  Gastrointestinal: Negative for abdominal pain, blood in stool, constipation, diarrhea, nausea and vomiting.  Endocrine: Negative for cold intolerance, heat intolerance, polydipsia, polyphagia and polyuria.  Genitourinary: Negative for dysuria, flank pain, frequency, hematuria, menstrual problem, pelvic pain, urgency, vaginal bleeding and vaginal discharge.  Musculoskeletal: Negative for arthralgias, back pain and myalgias.  Skin: Negative for rash.  Allergic/Immunologic: Negative for environmental allergies and food allergies.  Neurological: Negative for dizziness, weakness, light-headedness, numbness and headaches.  Hematological: Negative for adenopathy. Does not bruise/bleed easily.  Psychiatric/Behavioral: Positive for depression. Negative for decreased concentration, dysphoric mood and suicidal ideas. The patient is not nervous/anxious and does not have insomnia.     Patient Active Problem List   Diagnosis Date Noted  . Annual physical exam 04/08/2019  . Recurrent major depressive disorder, in partial remission ([place]) 06/22/2017  . S/P laparoscopic hysterectomy 01/11/2015  . Iron deficiency anemia 08/22/2014  . Palpitations 07/11/2013  . Preeclampsia 07/11/2013    No Known Allergies  Past Surgical History:  Procedure Laterality Date  . ABDOMINAL HYSTERECTOMY    . ABDOMINOPLASTY    . ABDOMINOPLASTY  2007   FATTY TISSUE REMOVED  . AUGMENTATION MAMMAPLASTY Bilateral 2007  . BREAST SURGERY     augmentation  . CESAREAN SECTION     x 2  . CYSTOSCOPY  01/11/2015   Procedure: CYSTOSCOPY;  Surgeon:  Malachy Mood, MD;  Location: [facility];  Service: Gynecology;;  . LAPAROSCOPIC BILATERAL SALPINGECTOMY Bilateral 01/11/2015   Procedure: LAPAROSCOPIC BILATERAL SALPINGECTOMY;  Surgeon: Malachy Mood, MD;  Location: [facility];  Service: Gynecology;  Laterality: Bilateral;  . LAPAROSCOPIC  HYSTERECTOMY N/A 01/11/2015   Procedure: HYSTERECTOMY TOTAL LAPAROSCOPIC;  Surgeon: Malachy Mood, MD;  Location: [facility];  Service: Gynecology;  Laterality: N/A;    Social History   Tobacco Use  . Smoking status: Former Smoker    Quit date: 01/02/2006    Years since quitting: 13.8  . Smokeless tobacco: Never Used  Vaping Use  . Vaping Use: Never used  Substance Use Topics  . Alcohol use: Yes    Comment: occ.  . Drug use: No     Medication list has been reviewed and updated.  Current Meds  Medication Sig  . Ascorbic Acid (VITAMIN C PO) Take by mouth.  Marland Kitchen buPROPion (WELLBUTRIN XL) 300 MG 24 hr tablet Take 1 tablet (300 mg total) by mouth daily.  . melatonin 5 MG TABS Take 5 mg by mouth.  . Multiple Vitamins-Minerals (ZINC PO) Take by mouth.  Marland Kitchen VITAMIN D PO Take by mouth.    PHQ 2/9 Scores 10/27/2019 05/10/2019 04/08/2019 11/24/2018  PHQ - 2 Score 0 0 0 0  PHQ- 9 Score 0 0 0 0    GAD 7 : Generalized Anxiety Score 10/27/2019 05/10/2019 04/08/2019  Nervous, Anxious, on Edge 0 0 0  Control/stop worrying 1 0 0  Worry too much - different things 0 0 0  Trouble relaxing 0 0 0  Restless 0 0 0  Easily annoyed or irritable 0 0 0  Afraid - awful might happen 0 0 0  Total GAD 7 Score 1 0 0  Anxiety Difficulty Not difficult at all - -    BP Readings from Last 3 Encounters:  10/27/19 130/80  10/26/19 (!) 156/101  05/10/19 100/62    Physical Exam Vitals and nursing note reviewed.  Constitutional:      General: She is not irritable.    Appearance: She is well-developed.  HENT:     Head: Normocephalic.     Right Ear: Tympanic membrane, ear canal and external ear normal. There is no impacted cerumen.     Left Ear: Tympanic membrane, ear canal and external ear normal. There is no impacted cerumen.     Nose: Nose normal. No congestion or rhinorrhea.     Mouth/Throat:     Mouth: Mucous membranes are moist.     Pharynx: Oropharynx is clear.  Eyes:     General: Lids are everted, no  foreign bodies appreciated. No scleral icterus.       Left eye: No foreign body or hordeolum.     Conjunctiva/sclera: Conjunctivae normal.     Right eye: Right conjunctiva is not injected.     Left eye: Left conjunctiva is not injected.     Pupils: Pupils are equal, round, and reactive to light.  Neck:     Thyroid: No thyromegaly.     Vascular: No JVD.     Trachea: No tracheal deviation.  Cardiovascular:     Rate and Rhythm: Normal rate and regular rhythm.     Heart sounds: Normal heart sounds. No murmur heard.  No friction rub. No gallop.   Pulmonary:     Effort: Pulmonary effort is normal. No respiratory distress.     Breath sounds: Normal breath sounds. No wheezing, rhonchi or rales.  Abdominal:  General: Bowel sounds are normal.     Palpations: Abdomen is soft. There is no mass.     Tenderness: There is no abdominal tenderness. There is no guarding or rebound.  Musculoskeletal:        General: No tenderness. Normal range of motion.     Cervical back: Normal range of motion and neck supple.  Lymphadenopathy:     Cervical: No cervical adenopathy.  Skin:    General: Skin is warm.     Capillary Refill: Capillary refill takes less than 2 seconds.     Findings: No rash.  Neurological:     General: No focal deficit present.     Mental Status: She is alert and oriented to person, place, and time.     Cranial Nerves: No cranial nerve deficit.     Deep Tendon Reflexes: Reflexes normal.  Psychiatric:        Mood and Affect: Mood is not anxious or depressed.     Wt Readings from Last 3 Encounters:  10/27/19 191 lb (86.6 kg)  10/26/19 191 lb (86.6 kg)  05/10/19 192 lb (87.1 kg)    BP 130/80   Pulse 82   Ht 5\' 5"  (1.651 m)   Wt 191 lb (86.6 kg)   LMP 01/08/2015 (Exact Date)   BMI 31.78 kg/m    Assessment and Plan: 1. Recurrent major depressive disorder, in partial remission ([place]) Patient follow-up from currently on bupropion XL 300 mg would like to decrease to a  maintenance of 150 XL and patient is doing well currently with a PHQ that is 0 and a gad score of 1.  We will decrease to bupropion XL 150 mg once a day and will recheck in 6 months and at that time discussed whether we will have a continuance of therapy. - buPROPion (WELLBUTRIN XL) 150 MG 24 hr tablet; Take 1 tablet (150 mg total) by mouth daily.  Dispense: 90 tablet; Refill: 1

## 2019-11-18 ENCOUNTER — Other Ambulatory Visit: Payer: Self-pay | Admitting: Obstetrics and Gynecology

## 2019-11-18 DIAGNOSIS — R921 Mammographic calcification found on diagnostic imaging of breast: Secondary | ICD-10-CM

## 2020-01-03 ENCOUNTER — Ambulatory Visit
Admission: RE | Admit: 2020-01-03 | Discharge: 2020-01-03 | Disposition: A | Discharge status: Home / Self Care | Payer: 59 | Source: Ambulatory Visit | Attending: Obstetrics and Gynecology | Admitting: Obstetrics and Gynecology

## 2020-01-03 ENCOUNTER — Other Ambulatory Visit: Payer: Self-pay

## 2020-01-03 DIAGNOSIS — R921 Mammographic calcification found on diagnostic imaging of breast: Secondary | ICD-10-CM | POA: Diagnosis not present

## 2020-06-22 ENCOUNTER — Other Ambulatory Visit: Payer: Self-pay

## 2020-06-22 ENCOUNTER — Ambulatory Visit (INDEPENDENT_AMBULATORY_CARE_PROVIDER_SITE_OTHER): Payer: 59 | Admitting: Family Medicine

## 2020-06-22 VITALS — BP 130/80 | HR 68 | Ht 61.0 in | Wt 199.0 lb

## 2020-06-22 DIAGNOSIS — Z0000 Encounter for general adult medical examination without abnormal findings: Secondary | ICD-10-CM

## 2020-06-22 DIAGNOSIS — F3341 Major depressive disorder, recurrent, in partial remission: Secondary | ICD-10-CM

## 2020-06-22 MED ORDER — BUPROPION HCL 75 MG PO TABS
75.0000 mg | ORAL_TABLET | Freq: Every day | ORAL | 1 refills | Status: DC
Start: 2020-06-22 — End: 2021-10-04

## 2020-06-22 NOTE — Progress Notes (Signed)
Date:  06/22/2020   Name:  Denise Bolton   DOB:  1980-09-17   MRN:  [id number]   Chief Complaint: Depression and Annual Exam  Patient is a 40 year old female who presents for a comprehensive physical exam. The patient reports the following problems: none. Health maintenance has been reviewed up to date.  Depression        This is a chronic problem.  The current episode started more than 1 year ago.   The onset quality is sudden.   The problem occurs intermittently.  The problem has been gradually improving since onset.  Associated symptoms include no decreased concentration, no fatigue, no helplessness, no hopelessness, does not have insomnia, not irritable, no restlessness, no decreased interest, no appetite change, no body aches, no myalgias, no headaches, no indigestion, not sad and no suicidal ideas.  Past treatments include other medications.  Compliance with treatment is good.  Previous treatment provided significant relief.   Lab Results  Component Value Date   CREATININE 0.74 04/08/2019   BUN 12 04/08/2019   NA 141 04/08/2019   K 4.9 04/08/2019   CL 107 (H) 04/08/2019   CO2 20 04/08/2019   Lab Results  Component Value Date   CHOL 188 04/08/2019   HDL 69 04/08/2019   LDLCALC 108 (H) 04/08/2019   TRIG 59 04/08/2019   CHOLHDL 2.7 06/22/2017   No results found for: TSH No results found for: HGBA1C Lab Results  Component Value Date   WBC 7.2 01/12/2015   HGB 10.4 (L) 01/12/2015   HCT 31.7 (L) 01/12/2015   MCV 91.9 01/12/2015   PLT 243 01/12/2015   Lab Results  Component Value Date   ALT 18 04/08/2019   AST 16 04/08/2019   ALKPHOS 64 04/08/2019   BILITOT 0.5 04/08/2019     Review of Systems  Constitutional: Negative.  Negative for appetite change, chills, fatigue, fever and unexpected weight change.  HENT: Negative for congestion, ear discharge, ear pain, rhinorrhea, sinus pressure, sneezing and sore throat.   Eyes: Negative for photophobia, pain,  discharge, redness and itching.  Respiratory: Negative for cough, shortness of breath, wheezing and stridor.   Gastrointestinal: Negative for abdominal pain, blood in stool, constipation, diarrhea, nausea and vomiting.  Endocrine: Negative for cold intolerance, heat intolerance, polydipsia, polyphagia and polyuria.  Genitourinary: Negative for dysuria, flank pain, frequency, hematuria, menstrual problem, pelvic pain, urgency, vaginal bleeding and vaginal discharge.  Musculoskeletal: Negative for arthralgias, back pain and myalgias.  Skin: Negative for rash.  Allergic/Immunologic: Negative for environmental allergies and food allergies.  Neurological: Negative for dizziness, weakness, light-headedness, numbness and headaches.  Hematological: Negative for adenopathy. Does not bruise/bleed easily.  Psychiatric/Behavioral: Positive for depression. Negative for decreased concentration, dysphoric mood and suicidal ideas. The patient is not nervous/anxious and does not have insomnia.     Patient Active Problem List   Diagnosis Date Noted  . Annual physical exam 04/08/2019  . Recurrent major depressive disorder, in partial remission ([place]) 06/22/2017  . S/P laparoscopic hysterectomy 01/11/2015  . Iron deficiency anemia 08/22/2014  . Palpitations 07/11/2013  . Preeclampsia 07/11/2013    No Known Allergies  Past Surgical History:  Procedure Laterality Date  . ABDOMINAL HYSTERECTOMY    . ABDOMINOPLASTY    . ABDOMINOPLASTY  2007   FATTY TISSUE REMOVED  . AUGMENTATION MAMMAPLASTY Bilateral 2007  . BREAST SURGERY     augmentation  . CESAREAN SECTION     x 2  . CYSTOSCOPY  01/11/2015  Procedure: CYSTOSCOPY;  Surgeon: Malachy Mood, MD;  Location: [facility];  Service: Gynecology;;  . LAPAROSCOPIC BILATERAL SALPINGECTOMY Bilateral 01/11/2015   Procedure: LAPAROSCOPIC BILATERAL SALPINGECTOMY;  Surgeon: Malachy Mood, MD;  Location: [facility];  Service: Gynecology;  Laterality: Bilateral;  .  LAPAROSCOPIC HYSTERECTOMY N/A 01/11/2015   Procedure: HYSTERECTOMY TOTAL LAPAROSCOPIC;  Surgeon: Malachy Mood, MD;  Location: [facility];  Service: Gynecology;  Laterality: N/A;    Social History   Tobacco Use  . Smoking status: Former Smoker    Quit date: 01/02/2006    Years since quitting: 14.4  . Smokeless tobacco: Never Used  Vaping Use  . Vaping Use: Never used  Substance Use Topics  . Alcohol use: Yes    Comment: occ.  . Drug use: No     Medication list has been reviewed and updated.  Current Meds  Medication Sig  . Ascorbic Acid (VITAMIN C PO) Take by mouth.  Marland Kitchen buPROPion (WELLBUTRIN XL) 150 MG 24 hr tablet Take 1 tablet (150 mg total) by mouth daily.  . melatonin 5 MG TABS Take 5 mg by mouth.  . Multiple Vitamins-Minerals (ZINC PO) Take by mouth.  Marland Kitchen VITAMIN D PO Take by mouth.    PHQ 2/9 Scores 06/22/2020 10/27/2019 05/10/2019 04/08/2019  PHQ - 2 Score 0 0 0 0  PHQ- 9 Score 0 0 0 0    GAD 7 : Generalized Anxiety Score 06/22/2020 10/27/2019 05/10/2019 04/08/2019  Nervous, Anxious, on Edge 0 0 0 0  Control/stop worrying 0 1 0 0  Worry too much - different things 0 0 0 0  Trouble relaxing 0 0 0 0  Restless 0 0 0 0  Easily annoyed or irritable 0 0 0 0  Afraid - awful might happen 0 0 0 0  Total GAD 7 Score 0 1 0 0  Anxiety Difficulty - Not difficult at all - -    BP Readings from Last 3 Encounters:  06/22/20 130/80  10/27/19 130/80  10/26/19 (!) 156/101    Physical Exam Constitutional:      General: She is not irritable.    Appearance: She is well-developed.  HENT:     Head: Normocephalic.     Right Ear: Tympanic membrane and external ear normal.     Left Ear: Tympanic membrane and external ear normal.     Nose: Nose normal. No congestion or rhinorrhea.     Mouth/Throat:     Mouth: Mucous membranes are moist.  Eyes:     General: Lids are everted, no foreign bodies appreciated. No scleral icterus.       Left eye: No foreign body or hordeolum.      Conjunctiva/sclera: Conjunctivae normal.     Right eye: Right conjunctiva is not injected.     Left eye: Left conjunctiva is not injected.     Pupils: Pupils are equal, round, and reactive to light.  Neck:     Thyroid: No thyromegaly.     Vascular: No JVD.     Trachea: No tracheal deviation.  Cardiovascular:     Rate and Rhythm: Normal rate and regular rhythm.     Heart sounds: Normal heart sounds. No murmur heard. No friction rub. No gallop.   Pulmonary:     Effort: Pulmonary effort is normal. No respiratory distress.     Breath sounds: Normal breath sounds. No wheezing, rhonchi or rales.  Abdominal:     General: Bowel sounds are normal.     Palpations: Abdomen is soft. There  is no mass.     Tenderness: There is no abdominal tenderness. There is no guarding or rebound.  Musculoskeletal:        General: No tenderness. Normal range of motion.     Cervical back: Normal range of motion and neck supple.  Lymphadenopathy:     Cervical: No cervical adenopathy.  Skin:    General: Skin is warm.     Findings: No erythema or rash.  Neurological:     Mental Status: She is alert and oriented to person, place, and time.     Cranial Nerves: No cranial nerve deficit.     Deep Tendon Reflexes: Reflexes normal.  Psychiatric:        Mood and Affect: Mood is not anxious or depressed.     Wt Readings from Last 3 Encounters:  06/22/20 199 lb (90.3 kg)  10/27/19 191 lb (86.6 kg)  10/26/19 191 lb (86.6 kg)    BP 130/80   Pulse 68   Ht 5\' 1"  (1.549 m)   Wt 199 lb (90.3 kg)   LMP 01/08/2015 (Exact Date)   BMI 37.60 kg/m   Assessment and Plan: KENNI BEDELL is a 40 y.o. female who presents today for her Complete Annual Exam. She feels well. She reports exercising . She reports she is sleeping well.  1. Annual physical exam No subjective objective concerns noted during history and physical exam.Immunizations are reviewed and recommendations provided.   Age appropriate screening tests are  discussed. Counseling given for risk factor reduction interventions.  We will obtain lipid panel renal function panel for surveillance. - Lipid Panel With LDL/HDL Ratio - Renal Function Panel  2. Recurrent major depressive disorder, in partial remission ([place]) Patient would like to start tapering off of bupropion.  Patient is doing well but we will drop from the XL to the 75 mg once every morning and patient has been given 30 days with a refill to taper off over a 34-month timeframe. - buPROPion (WELLBUTRIN) 75 MG tablet; Take 1 tablet (75 mg total) by mouth daily.  Dispense: 30 tablet; Refill: 1

## 2020-06-23 LAB — LIPID PANEL WITH LDL/HDL RATIO
Cholesterol, Total: 217 mg/dL — ABNORMAL HIGH (ref 100–199)
HDL: 65 mg/dL (ref >39)
LDL Chol Calc (NIH): 136 mg/dL — ABNORMAL HIGH (ref 0–99)
LDL/HDL Ratio: 2.1 ratio (ref 0.0–3.2)
Triglycerides: 91 mg/dL (ref 0–149)
VLDL Cholesterol Cal: 16 mg/dL (ref 5–40)

## 2020-06-23 LAB — RENAL FUNCTION PANEL
Albumin: 4 g/dL (ref 3.8–4.8)
BUN/Creatinine Ratio: 13 (ref 9–23)
BUN: 9 mg/dL (ref 6–24)
CO2: 20 mmol/L (ref 20–29)
Calcium: 8.7 mg/dL (ref 8.7–10.2)
Chloride: 103 mmol/L (ref 96–106)
Creatinine, Ser: 0.7 mg/dL (ref 0.57–1.00)
Glucose: 93 mg/dL (ref 65–99)
Phosphorus: 3.2 mg/dL (ref 3.0–4.3)
Potassium: 4.8 mmol/L (ref 3.5–5.2)
Sodium: 138 mmol/L (ref 134–144)
eGFR: 111 mL/min/{1.73_m2} (ref >59)

## 2020-07-06 ENCOUNTER — Other Ambulatory Visit: Payer: Self-pay | Admitting: Family Medicine

## 2020-07-06 DIAGNOSIS — F3341 Major depressive disorder, recurrent, in partial remission: Secondary | ICD-10-CM

## 2020-07-06 NOTE — Telephone Encounter (Signed)
   Notes to clinic: requesting a 90 day supply   Requested Prescriptions  Pending Prescriptions Disp Refills   buPROPion (WELLBUTRIN) 75 MG tablet [Pharmacy Med Name: BUPROPION HCL 75 MG TABLET] 90 tablet 1    Sig: TAKE 1 TABLET BY [place]      Psychiatry: Antidepressants - bupropion Passed - 07/06/2020 10:41 AM      Passed - Completed PHQ-2 or PHQ-9 in the last 360 days      Passed - Last BP in normal range    BP Readings from Last 1 Encounters:  06/22/20 130/80          Passed - Valid encounter within last 6 months    Recent Outpatient Visits           2 weeks ago Annual physical exam   [facility] Juline Patch, MD   8 months ago Recurrent major depressive disorder, in partial remission ([place])   [facility] Juline Patch, MD   1 year ago Recurrent major depressive disorder, in partial remission ([place])   [facility] Juline Patch, MD   1 year ago Encounter for annual physical examination excluding gynecological examination in a patient older than 74 years   [facility] Juline Patch, MD   1 year ago Essential hypertension   [facility] Juline Patch, MD

## 2021-03-01 ENCOUNTER — Other Ambulatory Visit: Payer: Self-pay | Admitting: Family Medicine

## 2021-03-01 DIAGNOSIS — Z1231 Encounter for screening mammogram for malignant neoplasm of breast: Secondary | ICD-10-CM

## 2021-03-12 ENCOUNTER — Other Ambulatory Visit: Payer: Self-pay

## 2021-03-12 ENCOUNTER — Ambulatory Visit
Admission: RE | Admit: 2021-03-12 | Discharge: 2021-03-12 | Disposition: A | Discharge status: Home / Self Care | Payer: 59 | Source: Ambulatory Visit | Attending: Family Medicine | Admitting: Family Medicine

## 2021-03-12 DIAGNOSIS — Z1231 Encounter for screening mammogram for malignant neoplasm of breast: Secondary | ICD-10-CM | POA: Insufficient documentation

## 2021-03-14 ENCOUNTER — Ambulatory Visit: Payer: 59 | Admitting: Family Medicine

## 2021-03-14 ENCOUNTER — Other Ambulatory Visit: Payer: Self-pay

## 2021-03-14 ENCOUNTER — Encounter: Payer: Self-pay | Admitting: Family Medicine

## 2021-03-14 VITALS — BP 130/76 | HR 84 | Ht 61.0 in | Wt 197.0 lb

## 2021-03-14 DIAGNOSIS — Z202 Contact with and (suspected) exposure to infections with a predominantly sexual mode of transmission: Secondary | ICD-10-CM

## 2021-03-14 NOTE — Progress Notes (Signed)
Date:  03/14/2021   Name:  Denise Bolton   DOB:  07/13/1980   MRN:  [id number]   Chief Complaint: std check (Just wants a check up for STDs.)  Patient is a 41 year old female who presents for a STD exam. The patient reports the following problems: none. Health maintenance has been reviewed up to date.     Lab Results  Component Value Date   NA 138 06/22/2020   K 4.8 06/22/2020   CO2 20 06/22/2020   GLUCOSE 93 06/22/2020   BUN 9 06/22/2020   CREATININE 0.70 06/22/2020   CALCIUM 8.7 06/22/2020   EGFR 111 06/22/2020   GFRNONAA 102 04/08/2019   Lab Results  Component Value Date   CHOL 217 (H) 06/22/2020   HDL 65 06/22/2020   LDLCALC 136 (H) 06/22/2020   TRIG 91 06/22/2020   CHOLHDL 2.7 06/22/2017   No results found for: TSH No results found for: HGBA1C Lab Results  Component Value Date   WBC 7.2 01/12/2015   HGB 10.4 (L) 01/12/2015   HCT 31.7 (L) 01/12/2015   MCV 91.9 01/12/2015   PLT 243 01/12/2015   Lab Results  Component Value Date   ALT 18 04/08/2019   AST 16 04/08/2019   ALKPHOS 64 04/08/2019   BILITOT 0.5 04/08/2019   No results found for: 25OHVITD2, 25OHVITD3, VD25OH   Review of Systems  Constitutional:  Negative for chills and fever.  HENT:  Negative for drooling, ear discharge, ear pain and sore throat.   Respiratory:  Negative for cough, shortness of breath and wheezing.   Cardiovascular:  Negative for chest pain, palpitations and leg swelling.  Gastrointestinal:  Negative for abdominal pain, blood in stool, constipation, diarrhea and nausea.  Endocrine: Negative for polydipsia.  Genitourinary:  Negative for dysuria, frequency, hematuria and urgency.  Musculoskeletal:  Negative for back pain, myalgias and neck pain.  Skin:  Negative for rash.  Allergic/Immunologic: Negative for environmental allergies.  Neurological:  Negative for dizziness and headaches.  Hematological:  Does not bruise/bleed easily.  Psychiatric/Behavioral:  Negative for  suicidal ideas. The patient is not nervous/anxious.    Patient Active Problem List   Diagnosis Date Noted   Annual physical exam 04/08/2019   Recurrent major depressive disorder, in partial remission ([place]) 06/22/2017   S/P laparoscopic hysterectomy 01/11/2015   Iron deficiency anemia 08/22/2014   Palpitations 07/11/2013   Preeclampsia 07/11/2013    No Known Allergies  Past Surgical History:  Procedure Laterality Date   ABDOMINAL HYSTERECTOMY     ABDOMINOPLASTY     ABDOMINOPLASTY  2007   FATTY TISSUE REMOVED   AUGMENTATION MAMMAPLASTY Bilateral 2007   BREAST SURGERY     augmentation   CESAREAN SECTION     x 2   CYSTOSCOPY  01/11/2015   Procedure: CYSTOSCOPY;  Surgeon: Malachy Mood, MD;  Location: [facility];  Service: Gynecology;;   LAPAROSCOPIC BILATERAL SALPINGECTOMY Bilateral 01/11/2015   Procedure: LAPAROSCOPIC BILATERAL SALPINGECTOMY;  Surgeon: Malachy Mood, MD;  Location: [facility];  Service: Gynecology;  Laterality: Bilateral;   LAPAROSCOPIC HYSTERECTOMY N/A 01/11/2015   Procedure: HYSTERECTOMY TOTAL LAPAROSCOPIC;  Surgeon: Malachy Mood, MD;  Location: [facility];  Service: Gynecology;  Laterality: N/A;    Social History   Tobacco Use   Smoking status: Former    Types: Cigarettes    Quit date: 01/02/2006    Years since quitting: 15.2   Smokeless tobacco: Never  Vaping Use   Vaping Use: Never used  Substance  Use Topics   Alcohol use: Yes    Comment: occ.   Drug use: No     Medication list has been reviewed and updated.  No outpatient medications have been marked as taking for the 03/14/21 encounter (Office Visit) with Juline Patch, MD.    [facility] 2/9 Scores 03/14/2021 06/22/2020 10/27/2019 05/10/2019  PHQ - 2 Score 0 0 0 0  PHQ- 9 Score 0 0 0 0    GAD 7 : Generalized Anxiety Score 03/14/2021 06/22/2020 10/27/2019 05/10/2019  Nervous, Anxious, on Edge 0 0 0 0  Control/stop worrying 0 0 1 0  Worry too much - different things 0 0 0 0  Trouble relaxing 0 0 0  0  Restless 0 0 0 0  Easily annoyed or irritable 0 0 0 0  Afraid - awful might happen 0 0 0 0  Total GAD 7 Score 0 0 1 0  Anxiety Difficulty Not difficult at all - Not difficult at all -    BP Readings from Last 3 Encounters:  03/14/21 130/76  06/22/20 130/80  10/27/19 130/80    Physical Exam Vitals and nursing note reviewed.  Constitutional:      Appearance: She is well-developed.  HENT:     Head: Normocephalic.     Right Ear: External ear normal.     Left Ear: External ear normal.  Eyes:     General: Lids are everted, no foreign bodies appreciated. No scleral icterus.       Left eye: No foreign body or hordeolum.     Conjunctiva/sclera: Conjunctivae normal.     Right eye: Right conjunctiva is not injected.     Left eye: Left conjunctiva is not injected.     Pupils: Pupils are equal, round, and reactive to light.  Neck:     Thyroid: No thyromegaly.     Vascular: No JVD.     Trachea: No tracheal deviation.  Cardiovascular:     Rate and Rhythm: Normal rate and regular rhythm.     Heart sounds: Normal heart sounds. No murmur heard.   No friction rub. No gallop.  Pulmonary:     Effort: Pulmonary effort is normal. No respiratory distress.     Breath sounds: Normal breath sounds. No wheezing or rales.  Abdominal:     General: Bowel sounds are normal.     Palpations: Abdomen is soft. There is no mass.     Tenderness: There is no abdominal tenderness. There is no guarding or rebound.  Musculoskeletal:        General: No tenderness. Normal range of motion.     Cervical back: Normal range of motion and neck supple.  Lymphadenopathy:     Cervical: No cervical adenopathy.  Skin:    General: Skin is warm.     Findings: No rash.  Neurological:     Mental Status: She is alert and oriented to person, place, and time.     Cranial Nerves: No cranial nerve deficit.     Deep Tendon Reflexes: Reflexes normal.  Psychiatric:        Mood and Affect: Mood is not anxious or depressed.     Wt Readings from Last 3 Encounters:  03/14/21 197 lb (89.4 kg)  06/22/20 199 lb (90.3 kg)  10/27/19 191 lb (86.6 kg)    BP 130/76    Pulse 84    Ht 5' 1" (1.549 m)    Wt 197 lb (89.4 kg)    LMP 01/08/2015 (Exact  Date)    BMI 37.22 kg/m   Assessment and Plan:  1. STD exposure This is unlikely for this patient however she has a friend that has manifested an ocular manifestation of an STD and because of casual contact is concerned about transmission.  We will check hep C RPR HIV chlamydia and GC.  My thoughts are the the only thing that I am thinking that could cause this is either trip, but there is been no manifestation of chlamydia concern and the possibility of ocular involvement of syphilis which can cause retinal detachment.  We will proceed with evaluation and patient is going to find out what organism is being treated. - Hepatitis C Antibody - RPR - HIV Antibody (routine testing w rflx) - GC/Chlamydia Probe Amp

## 2021-03-15 LAB — RPR: RPR Ser Ql: NONREACTIVE

## 2021-03-15 LAB — HIV ANTIBODY (ROUTINE TESTING W REFLEX): HIV Screen 4th Generation wRfx: NONREACTIVE

## 2021-03-15 LAB — HEPATITIS C ANTIBODY: Hep C Virus Ab: <0.1 s/co ratio (ref 0.0–0.9)

## 2021-03-16 LAB — GC/CHLAMYDIA PROBE AMP
Chlamydia trachomatis, NAA: NEGATIVE
Neisseria Gonorrhoeae by PCR: NEGATIVE

## 2021-04-04 DIAGNOSIS — M1611 Unilateral primary osteoarthritis, right hip: Secondary | ICD-10-CM | POA: Diagnosis not present

## 2021-04-04 DIAGNOSIS — M25351 Other instability, right hip: Secondary | ICD-10-CM | POA: Diagnosis not present

## 2021-04-04 DIAGNOSIS — M25551 Pain in right hip: Secondary | ICD-10-CM | POA: Diagnosis not present

## 2021-10-03 ENCOUNTER — Encounter: Payer: Self-pay | Admitting: Family Medicine

## 2021-10-04 ENCOUNTER — Other Ambulatory Visit: Payer: Self-pay | Admitting: Surgery

## 2021-10-04 ENCOUNTER — Ambulatory Visit: Payer: 59 | Admitting: Family Medicine

## 2021-10-04 ENCOUNTER — Encounter: Payer: Self-pay | Admitting: Surgery

## 2021-10-04 ENCOUNTER — Encounter: Payer: Self-pay | Admitting: Family Medicine

## 2021-10-04 ENCOUNTER — Ambulatory Visit (INDEPENDENT_AMBULATORY_CARE_PROVIDER_SITE_OTHER): Payer: 59 | Admitting: Surgery

## 2021-10-04 VITALS — BP 120/80 | HR 80 | Ht 61.0 in | Wt 197.0 lb

## 2021-10-04 VITALS — BP 138/104 | HR 89 | Temp 97.5°F | Ht 65.0 in | Wt 184.6 lb

## 2021-10-04 DIAGNOSIS — L0501 Pilonidal cyst with abscess: Secondary | ICD-10-CM

## 2021-10-04 DIAGNOSIS — L0231 Cutaneous abscess of buttock: Secondary | ICD-10-CM | POA: Diagnosis not present

## 2021-10-04 DIAGNOSIS — L720 Epidermal cyst: Secondary | ICD-10-CM | POA: Diagnosis not present

## 2021-10-04 DIAGNOSIS — L0291 Cutaneous abscess, unspecified: Secondary | ICD-10-CM

## 2021-10-04 MED ORDER — AMOXICILLIN-POT CLAVULANATE 875-125 MG PO TABS: 1.0000 | ORAL_TABLET | Freq: Two times a day (BID) | ORAL | 0 refills | Status: AC

## 2021-10-04 NOTE — Patient Instructions (Signed)
Please pick up your prescription at your pharmacy.   If you have any concerns or questions, please feel free to call our office. See follow up appointment below.   Epidermoid Cyst  An epidermoid cyst, also called an epidermal cyst, is a small lump under your skin. The cyst contains a substance called keratin. Do not try to pop or open the cyst yourself. What are the causes? A blocked hair follicle. A hair that curls and re-enters the skin instead of growing straight out of the skin. A blocked pore. Irritated skin. An injury to the skin. Certain conditions that are passed along from parent to child. Human papillomavirus (HPV). This happens rarely when cysts occur on the bottom of the feet. Long-term sun damage to the skin. What increases the risk? Having acne. Being female. Having an injury to the skin. Being past puberty. Having certain conditions caused by genes (genetic disorder) What are the signs or symptoms? These cysts are usually harmless, but they can get infected. Symptoms of infection may include: Redness. Inflammation. Tenderness. Warmth. Fever. A bad-smelling substance that drains from the cyst. Pus that drains from the cyst. How is this treated? In many cases, epidermoid cysts go away on their own without treatment. If a cyst becomes infected, treatment may include: Opening and draining the cyst, done by a doctor. After draining, you may need minor surgery to remove the rest of the cyst. Antibiotic medicine. Shots of medicines (steroids) that help to reduce inflammation. Surgery to remove the cyst. Surgery may be done if the cyst: Becomes large. Bothers you. Has a chance of turning into cancer. Do not try to open a cyst yourself. Follow these instructions at home: Medicines Take over-the-counter and prescription medicines as told by your doctor. If you were prescribed an antibiotic medicine, take it as told by your doctor. Do not stop taking it even if you  start to feel better. General instructions Keep the area around your cyst clean and dry. Wear loose, dry clothing. Avoid touching your cyst. Check your cyst every day for signs of infection. Check for: Redness, swelling, or pain. Fluid or blood. Warmth. Pus or a bad smell. Keep all follow-up visits. How is this prevented? Wear clean, dry, clothing. Avoid wearing tight clothing. Keep your skin clean and dry. Take showers or baths every day. Contact a doctor if: Your cyst has symptoms of infection. Your condition does not improve or gets worse. You have a cyst that looks different from other cysts you have had. You have a fever. Get help right away if: Redness spreads from the cyst into the area close by. Summary An epidermoid cyst is a small lump under your skin. If a cyst becomes infected, treatment may include surgery to open and drain the cyst, or to remove it. Take over-the-counter and prescription medicines only as told by your doctor. Contact a doctor if your condition is not improving or is getting worse. Keep all follow-up visits. This information is not intended to replace advice given to you by your health care provider. Make sure you discuss any questions you have with your health care provider. Document Revised: 05/25/2019 Document Reviewed: 05/25/2019 Elsevier Patient Education  [place].

## 2021-10-04 NOTE — Progress Notes (Signed)
Date:  10/04/2021   Name:  Denise Bolton   DOB:  14-Apr-1980   MRN:  [id number]   Chief Complaint: Cyst (Noticed x week ago- gotten worse x 2 days- can't sit down.)  Patient is a 41year old female who presents for a secondarily infected pilonidal cyst exam. The patient reports the following problems: Fever chills diaphoresis last night/significant pain unable to sit/onset 4 weeks ago is gradually increased in size and became more painful and irritated.Marland Kitchen Health maintenance has been reviewed up-to-date      Lab Results  Component Value Date   NA 138 06/22/2020   K 4.8 06/22/2020   CO2 20 06/22/2020   GLUCOSE 93 06/22/2020   BUN 9 06/22/2020   CREATININE 0.70 06/22/2020   CALCIUM 8.7 06/22/2020   EGFR 111 06/22/2020   GFRNONAA 102 04/08/2019   Lab Results  Component Value Date   CHOL 217 (H) 06/22/2020   HDL 65 06/22/2020   LDLCALC 136 (H) 06/22/2020   TRIG 91 06/22/2020   CHOLHDL 2.7 06/22/2017   No results found for: "TSH" No results found for: "HGBA1C" Lab Results  Component Value Date   WBC 7.2 01/12/2015   HGB 10.4 (L) 01/12/2015   HCT 31.7 (L) 01/12/2015   MCV 91.9 01/12/2015   PLT 243 01/12/2015   Lab Results  Component Value Date   ALT 18 04/08/2019   AST 16 04/08/2019   ALKPHOS 64 04/08/2019   BILITOT 0.5 04/08/2019   No results found for: "25OHVITD2", "25OHVITD3", "VD25OH"   Review of Systems  Constitutional:  Positive for chills, diaphoresis and fever.    Patient Active Problem List   Diagnosis Date Noted   Annual physical exam 04/08/2019   Recurrent major depressive disorder, in partial remission ([facility]) 06/22/2017   S/P laparoscopic hysterectomy 01/11/2015   Iron deficiency anemia 08/22/2014   Palpitations 07/11/2013   Preeclampsia 07/11/2013    No Known Allergies  Past Surgical History:  Procedure Laterality Date   ABDOMINAL HYSTERECTOMY     ABDOMINOPLASTY     ABDOMINOPLASTY  2007   FATTY TISSUE REMOVED   AUGMENTATION MAMMAPLASTY  Bilateral 2007   BREAST SURGERY     augmentation   CESAREAN SECTION     x 2   CYSTOSCOPY  01/11/2015   Procedure: CYSTOSCOPY;  Surgeon: Vena Austria, MD;  Location: [facility];  Service: Gynecology;;   LAPAROSCOPIC BILATERAL SALPINGECTOMY Bilateral 01/11/2015   Procedure: LAPAROSCOPIC BILATERAL SALPINGECTOMY;  Surgeon: Vena Austria, MD;  Location: [facility];  Service: Gynecology;  Laterality: Bilateral;   LAPAROSCOPIC HYSTERECTOMY N/A 01/11/2015   Procedure: HYSTERECTOMY TOTAL LAPAROSCOPIC;  Surgeon: Vena Austria, MD;  Location: [facility];  Service: Gynecology;  Laterality: N/A;    Social History   Tobacco Use   Smoking status: Former    Types: Cigarettes    Quit date: 01/02/2006    Years since quitting: 15.7   Smokeless tobacco: Never  Vaping Use   Vaping Use: Never used  Substance Use Topics   Alcohol use: Yes    Comment: occ.   Drug use: No     Medication list has been reviewed and updated.  No outpatient medications have been marked as taking for the 10/04/21 encounter (Office Visit) with Duanne Limerick, MD.       10/04/2021    8:21 AM 03/14/2021    1:26 PM 06/22/2020    9:57 AM 10/27/2019    3:03 PM  GAD 7 : Generalized Anxiety Score  Nervous, Anxious, on  Edge 0 0 0 0  Control/stop worrying 0 0 0 1  Worry too much - different things 0 0 0 0  Trouble relaxing 0 0 0 0  Restless 0 0 0 0  Easily annoyed or irritable 0 0 0 0  Afraid - awful might happen 0 0 0 0  Total GAD 7 Score 0 0 0 1  Anxiety Difficulty Not difficult at all Not difficult at all  Not difficult at all       10/04/2021    8:21 AM 03/14/2021    1:26 PM 06/22/2020    9:57 AM  Depression screen PHQ 2/9  Decreased Interest 0 0 0  Down, Depressed, Hopeless 0 0 0  PHQ - 2 Score 0 0 0  Altered sleeping 0 0 0  Tired, decreased energy 0 0 0  Change in appetite 0 0 0  Feeling bad or failure about yourself  0 0 0  Trouble concentrating 0 0 0  Moving slowly or fidgety/restless 0 0 0  Suicidal  thoughts 0 0 0  PHQ-9 Score 0 0 0  Difficult doing work/chores Not difficult at all Not difficult at all     BP Readings from Last 3 Encounters:  10/04/21 120/80  03/14/21 130/76  06/22/20 130/80    Physical Exam Cardiovascular:     Rate and Rhythm: Normal rate.     Heart sounds: No murmur heard.    No friction rub. No gallop.  Pulmonary:     Breath sounds: No wheezing, rhonchi or rales.  Abdominal:     General: There is no distension.     Tenderness: There is no guarding or rebound.  Genitourinary:    Rectum: Tenderness present.     Comments: Erythema, exquisite tenderness, along the superior aspect perianal area is swelling with palpable tenderness consistent with infected pilonidal abscess. Neurological:     Mental Status: She is alert.     Wt Readings from Last 3 Encounters:  10/04/21 197 lb (89.4 kg)  03/14/21 197 lb (89.4 kg)  06/22/20 199 lb (90.3 kg)    BP 120/80   Pulse 80   Ht $R'5\' 1"'ht$  (1.549 m)   Wt 197 lb (89.4 kg)   LMP 01/08/2015 (Exact Date)   BMI 37.22 kg/m   Assessment and Plan:  1. Pilonidal abscess Discussed with Dr. Dahlia Byes via text and he has agreed to see patient in his office around 1015 this morning.  Patient verifies that she is n.p.o. only to have a small amount of black coffee at 6 AM.

## 2021-10-05 ENCOUNTER — Encounter: Payer: Self-pay | Admitting: Surgery

## 2021-10-05 NOTE — Progress Notes (Signed)
Patient ID: Denise Bolton, female   DOB: 1980/06/20, 41 y.o.   MRN: [id number]  HPI Denise Bolton is a 41 y.o. female seen in consultation at the request of Dr. Ronnald Ramp.  She reports significant pain above the gluteal cleft for the last couple of days that has been severe she has had on and off pain for a week or 2.  She does have chills but no objective fevers she could not measure her temperature.  The pain is severe sharp and worsening when she sits.  This is the first time she has ever had this.  No prior history of pilonidal disease.  No prior history of anorectal diseases.  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  She is able to perform more than 4 METS of activity without any shortness of breath or chest pain. Recent labs include a BMP that was completely normal her CBC was also normal. Prior surgical history includes C-sections as well as laparoscopic hysterectomy. HPI  Past Medical History:  Diagnosis Date   Anemia    BRCA negative    Family history of ovarian cancer    MOTHER   Genetic testing of female 10/2014   IBIS=11.1%   GERD (gastroesophageal reflux disease)    Hypertension    Menorrhagia     Past Surgical History:  Procedure Laterality Date   ABDOMINAL HYSTERECTOMY     ABDOMINOPLASTY     ABDOMINOPLASTY  2007   FATTY TISSUE REMOVED   AUGMENTATION MAMMAPLASTY Bilateral 2007   BREAST SURGERY     augmentation   CESAREAN SECTION     x 2   CYSTOSCOPY  01/11/2015   Procedure: CYSTOSCOPY;  Surgeon: Malachy Mood, MD;  Location: [facility];  Service: Gynecology;;   LAPAROSCOPIC BILATERAL SALPINGECTOMY Bilateral 01/11/2015   Procedure: LAPAROSCOPIC BILATERAL SALPINGECTOMY;  Surgeon: Malachy Mood, MD;  Location: [facility];  Service: Gynecology;  Laterality: Bilateral;   LAPAROSCOPIC HYSTERECTOMY N/A 01/11/2015   Procedure: HYSTERECTOMY TOTAL LAPAROSCOPIC;  Surgeon: Malachy Mood, MD;  Location: [facility];  Service: Gynecology;  Laterality: N/A;    Family History  Problem  Relation Age of Onset   Ovarian cancer Mother 56       DECEASED 14   Cancer Mother 41       LUNG   Melanoma Father 7   Breast cancer Neg Hx     Social History Social History   Tobacco Use   Smoking status: Former    Types: Cigarettes    Quit date: 01/02/2006    Years since quitting: 15.7   Smokeless tobacco: Never  Vaping Use   Vaping Use: Never used  Substance Use Topics   Alcohol use: Yes    Comment: occ.   Drug use: No    No Known Allergies  Current Outpatient Medications  Medication Sig Dispense Refill   amoxicillin-clavulanate (AUGMENTIN) 875-125 MG tablet Take 1 tablet by mouth 2 (two) times daily for 7 days. 14 tablet 0   No current facility-administered medications for this visit.     Review of Systems Full ROS  was asked and was negative except for the information on the HPI  Physical Exam Blood pressure (!) 138/104, pulse 89, temperature (!) 97.5 F (36.4 C), temperature source Oral, height $RemoveBefo'5\' 5"'HGLdvdOUDci$  (1.651 m), weight 184 lb 9.6 oz (83.7 kg), last menstrual period 01/08/2015, SpO2 98 %. CONSTITUTIONAL: NAD. EYES: Pupils are equal, round, Sclera are non-icteric. EARS, NOSE, MOUTH AND THROAT: The oral mucosa is pink and moist. Hearing is  intact to voice. LYMPH NODES:  Lymph nodes in the neck are normal. RESPIRATORY:  Lungs are clear. There is normal respiratory effort, with equal breath sounds bilaterally, and without pathologic use of accessory muscles. CARDIOVASCULAR: Heart is regular without murmurs, gallops, or rubs. GI: The abdomen is  soft, nontender, and nondistended. There are no palpable masses. There is no hepatosplenomegaly. There are normal bowel sounds in all quadrants. MUSCULOSKELETAL: Normal muscle strength and tone. No cyanosis or edema.   SKIN: There is erythema and exquisite tenderness to palpation above the gluteal cleft, there is fluctuance, no drainage , no evidence of necrotizing infection NEUROLOGIC: Motor and sensation is grossly  normal. Cranial nerves are grossly intact. PSYCH:  Oriented to person, place and time. Affect is normal.  Data Reviewed  I have personally reviewed the patient's imaging, laboratory findings and medical records.    Assessment/Plan 41 year old female with abscess above the gluteal cleft.  Most likely this is pilonidal cyst abscess.  Discussed with the patient in detail about the need for formal incision and drainage.  I also would like to try antibiotic therapy since she does have a good component of erythema and cellulitis.  I discussed with her in detail about options of drainage.  1-2 it under general anesthetic in the OR versus trying to perform her here in the office under local anesthetic.  She wishes to try to do it here in the office if at all possible.  Procedure discussed with her in detail.  Risk, benefits and possible implications including but not limited to: Bleeding, infection partial drainage and the need for formal drainage in the OR were all discussed with her in detail.  She understands and wished to proceed.  Please note that I spent greater than 40 minutes in this encounter including personally reviewing medical records, coordinating her care, placing orders and performing appropriate mentation.  A copy of this report was sent to the referring provider  PROCEDURE NOTE Incision and drainage of complex skin abscess gluteal cleft Excision of 2.2 cm epidermal inclusion cyst gluteal cleft Excisional debridement of the skin subcutaneous tissue and fascia measuring total of 6 cm  Diagnosis: Abscess above gluteal cleft  Findings: Complex abscess about gluteal cleft coming from an infected epidermal inclusion cyst  EBL: Minimal  Anesthesia: Lidocaine 1% with epi total of 30 cc  After informed consent was obtained the patient was placed in the prone position and she was prepped and draped in the usual sterile fashion.  Using local anesthetic we will perform a field block to make  sure she had adequate analgesia.  Using an 11 blade knife elliptical incision was created above the gluteal cleft.  Copious amount of pus was drained.  Also there was some sebum material and the cyst was also excised using the knife and scissors.  The cyst measured about 2.2 cm but it was broken in multiple pieces.  This was sent to permanent pathology.  We proceeded to perform excisional debridement of devitalized tissue including the subcutaneous tissue down to the fascia.  This was done with scissors.  Saline irrigation was used to irrigate the cavity and pressure was used to perform appropriate hemostasis.  Half inch packing was placed and a sterile dressing was applied.  No complications  Caroleen Hamman, MD FACS General Surgeon 10/05/2021, 2:46 PM

## 2021-10-09 ENCOUNTER — Telehealth: Payer: Self-pay

## 2021-10-09 NOTE — Telephone Encounter (Signed)
Patient notified of pathology results and reminded to keep follow up appointment on Monday.

## 2021-10-14 ENCOUNTER — Ambulatory Visit (INDEPENDENT_AMBULATORY_CARE_PROVIDER_SITE_OTHER): Payer: 59 | Admitting: Surgery

## 2021-10-14 ENCOUNTER — Encounter: Payer: Self-pay | Admitting: Surgery

## 2021-10-14 VITALS — BP 143/87 | HR 61 | Temp 98.7°F | Wt 188.8 lb

## 2021-10-14 DIAGNOSIS — L720 Epidermal cyst: Secondary | ICD-10-CM

## 2021-10-14 DIAGNOSIS — Z09 Encounter for follow-up examination after completed treatment for conditions other than malignant neoplasm: Secondary | ICD-10-CM

## 2021-10-14 NOTE — Patient Instructions (Signed)
If you have any concerns or questions, please feel free to call our office. Follow up as needed.   Incision and Drainage, Care After This sheet gives you information about how to care for yourself after your procedure. Your health care provider may also give you more specific instructions. If you have problems or questions, contact your health care provider. What can I expect after the procedure? After the procedure, it is common to have: Pain or discomfort around the incision site. Blood, fluid, or pus (drainage) from the incision. Redness and firm skin around the incision site. Follow these instructions at home: Medicines Take over-the-counter and prescription medicines only as told by your health care provider. If you were prescribed an antibiotic medicine, use or take it as told by your health care provider. Do not stop using the antibiotic even if you start to feel better. Wound care Follow instructions from your health care provider about how to take care of your wound. Make sure you: Wash your hands with soap and water before and after you change your bandage (dressing). If soap and water are not available, use hand sanitizer. Change your dressing and packing as told by your health care provider. If your dressing is dry or stuck when you try to remove it, moisten or wet the dressing with saline or water so that it can be removed without harming your skin or tissues. If your wound is packed, leave it in place until your health care provider tells you to remove it. To remove the packing, moisten or wet the packing with saline or water so that it can be removed without harming your skin or tissues. Leave stitches (sutures), skin glue, or adhesive strips in place. These skin closures may need to stay in place for 2 weeks or longer. If adhesive strip edges start to loosen and curl up, you may trim the loose edges. Do not remove adhesive strips completely unless your health care provider tells  you to do that. Check your wound every day for signs of infection. Check for: More redness, swelling, or pain. More fluid or blood. Warmth. Pus or a bad smell. If you were sent home with a drain tube in place, follow instructions from your health care provider about: How to empty it. How to care for it at home.  General instructions Rest the affected area. Do not take baths, swim, or use a hot tub until your health care provider approves. Ask your health care provider if you may take showers. You may only be allowed to take sponge baths. Return to your normal activities as told by your health care provider. Ask your health care provider what activities are safe for you. Your health care provider may put you on activity or lifting restrictions. The incision will continue to drain. It is normal to have some clear or slightly bloody drainage. The amount of drainage should lessen each day. Do not apply any creams, ointments, or liquids unless you have been told to by your health care provider. Keep all follow-up visits as told by your health care provider. This is important. Contact a health care provider if: Your cyst or abscess returns. You have more redness, swelling, or pain around your incision. You have more fluid or blood coming from your incision. Your incision feels warm to the touch. You have pus or a bad smell coming from your incision. You have red streaks above or below the incision site. Get help right away if: You have severe pain   or bleeding. You cannot eat or drink without vomiting. You have a fever or chills. You have redness that spreads quickly. You have decreased urine output. You become short of breath. You have chest pain. You cough up blood. The affected area becomes numb or starts to tingle. These symptoms may represent a serious problem that is an emergency. Do not wait to see if the symptoms will go away. Get medical help right away. Call your local emergency  services (911 in the [place] Do not drive yourself to the hospital. Summary After this procedure, it is common to have fluid, blood, or pus coming from the surgery site. Follow all home care instructions. You will be told how to take care of your incision, how to check for infection, and how to take medicines. If you were prescribed an antibiotic medicine, take it as told by your health care provider. Do not stop taking the antibiotic even if you start to feel better. Contact a health care provider if you have increased redness, swelling, or pain around your incision. Get help right away if you have chest pain, you vomit, you cough up blood, or you have shortness of breath. Keep all follow-up visits as told by your health care provider. This is important. This information is not intended to replace advice given to you by your health care provider. Make sure you discuss any questions you have with your health care provider. Document Revised: 05/23/2021 Document Reviewed: 11/29/2020 Elsevier Patient Education  2023 [employer] Inc.  

## 2021-10-15 NOTE — Progress Notes (Signed)
Denise Bolton is a 41 year old female well-known to me with prior history of I&D of abscess with epidermal inclusion cyst.  Pathology discussed with her.  She is doing well.  Fevers no chills no complaints She is packing the wound  PE NAD Wound healing well, small cavity, band-aid placed, no infection  A/P doing well.  No need for further packing.  Daily bandage RTC prn

## 2021-10-21 ENCOUNTER — Encounter: Payer: Self-pay | Admitting: Family Medicine

## 2021-10-21 ENCOUNTER — Other Ambulatory Visit
Admission: RE | Admit: 2021-10-21 | Discharge: 2021-10-21 | Disposition: A | Discharge status: Home / Self Care | Payer: 59 | Attending: Family Medicine | Admitting: Family Medicine

## 2021-10-21 ENCOUNTER — Ambulatory Visit (INDEPENDENT_AMBULATORY_CARE_PROVIDER_SITE_OTHER): Payer: 59 | Admitting: Family Medicine

## 2021-10-21 VITALS — BP 120/80 | HR 64 | Ht 65.0 in | Wt 191.0 lb

## 2021-10-21 DIAGNOSIS — Z0000 Encounter for general adult medical examination without abnormal findings: Secondary | ICD-10-CM | POA: Diagnosis not present

## 2021-10-21 DIAGNOSIS — E785 Hyperlipidemia, unspecified: Secondary | ICD-10-CM | POA: Insufficient documentation

## 2021-10-21 LAB — LIPID PANEL
Cholesterol: 201 mg/dL — ABNORMAL HIGH (ref 0–200)
HDL: 71 mg/dL (ref >40)
LDL Cholesterol: 114 mg/dL — ABNORMAL HIGH (ref 0–99)
Total CHOL/HDL Ratio: 2.8 RATIO
Triglycerides: 78 mg/dL (ref <150)
VLDL: 16 mg/dL (ref 0–40)

## 2021-10-21 LAB — COMPREHENSIVE METABOLIC PANEL
ALT: 16 U/L (ref 0–44)
AST: 16 U/L (ref 15–41)
Albumin: 4 g/dL (ref 3.5–5.0)
Alkaline Phosphatase: 45 U/L (ref 38–126)
Anion gap: 8 (ref 5–15)
BUN: 14 mg/dL (ref 6–20)
CO2: 22 mmol/L (ref 22–32)
Calcium: 8.6 mg/dL — ABNORMAL LOW (ref 8.9–10.3)
Chloride: 105 mmol/L (ref 98–111)
Creatinine, Ser: 0.75 mg/dL (ref 0.44–1.00)
GFR, Estimated: >60 mL/min (ref >60)
Glucose, Bld: 97 mg/dL (ref 70–99)
Potassium: 4.1 mmol/L (ref 3.5–5.1)
Sodium: 135 mmol/L (ref 135–145)
Total Bilirubin: 1.3 mg/dL — ABNORMAL HIGH (ref 0.3–1.2)
Total Protein: 6.6 g/dL (ref 6.5–8.1)

## 2021-10-21 LAB — HEMOCCULT GUIAC POC 1CARD (OFFICE): Fecal Occult Blood, POC: NEGATIVE

## 2021-10-21 NOTE — Progress Notes (Signed)
Date:  10/21/2021   Name:  Denise Bolton   DOB:  04-29-1980   MRN:  [id number]   Chief Complaint: Annual Exam  Patient is a 41 year old female who presents for a comprehensive physical exam. The patient reports the following problems: up to date. Health maintenance has been reviewed up to date.      Lab Results  Component Value Date   NA 138 06/22/2020   K 4.8 06/22/2020   CO2 20 06/22/2020   GLUCOSE 93 06/22/2020   BUN 9 06/22/2020   CREATININE 0.70 06/22/2020   CALCIUM 8.7 06/22/2020   EGFR 111 06/22/2020   GFRNONAA 102 04/08/2019   Lab Results  Component Value Date   CHOL 217 (H) 06/22/2020   HDL 65 06/22/2020   LDLCALC 136 (H) 06/22/2020   TRIG 91 06/22/2020   CHOLHDL 2.7 06/22/2017   No results found for: "TSH" No results found for: "HGBA1C" Lab Results  Component Value Date   WBC 7.2 01/12/2015   HGB 10.4 (L) 01/12/2015   HCT 31.7 (L) 01/12/2015   MCV 91.9 01/12/2015   PLT 243 01/12/2015   Lab Results  Component Value Date   ALT 18 04/08/2019   AST 16 04/08/2019   ALKPHOS 64 04/08/2019   BILITOT 0.5 04/08/2019   No results found for: "25OHVITD2", "25OHVITD3", "VD25OH"   Review of Systems  Constitutional:  Negative for chills, fever and unexpected weight change.  HENT:  Negative for trouble swallowing.   Respiratory:  Negative for cough, shortness of breath and wheezing.   Cardiovascular:  Negative for chest pain, palpitations and leg swelling.  Gastrointestinal:  Negative for abdominal pain, blood in stool, diarrhea and nausea.  Endocrine: Negative for polydipsia and polyuria.  Genitourinary:  Negative for difficulty urinating.  Musculoskeletal:  Negative for myalgias and neck pain.  Skin:  Negative for rash.  Neurological:  Negative for headaches.  Hematological:  Negative for adenopathy. Does not bruise/bleed easily.  Psychiatric/Behavioral:  Negative for suicidal ideas. The patient is not nervous/anxious.     Patient Active Problem List    Diagnosis Date Noted   Annual physical exam 04/08/2019   Recurrent major depressive disorder, in partial remission ([place]) 06/22/2017   S/P laparoscopic hysterectomy 01/11/2015   Iron deficiency anemia 08/22/2014   Palpitations 07/11/2013   Preeclampsia 07/11/2013    No Known Allergies  Past Surgical History:  Procedure Laterality Date   ABDOMINAL HYSTERECTOMY     ABDOMINOPLASTY     ABDOMINOPLASTY  2007   FATTY TISSUE REMOVED   AUGMENTATION MAMMAPLASTY Bilateral 2007   BREAST SURGERY     augmentation   CESAREAN SECTION     x 2   CYSTOSCOPY  01/11/2015   Procedure: CYSTOSCOPY;  Surgeon: Malachy Mood, MD;  Location: [facility];  Service: Gynecology;;   LAPAROSCOPIC BILATERAL SALPINGECTOMY Bilateral 01/11/2015   Procedure: LAPAROSCOPIC BILATERAL SALPINGECTOMY;  Surgeon: Malachy Mood, MD;  Location: [facility];  Service: Gynecology;  Laterality: Bilateral;   LAPAROSCOPIC HYSTERECTOMY N/A 01/11/2015   Procedure: HYSTERECTOMY TOTAL LAPAROSCOPIC;  Surgeon: Malachy Mood, MD;  Location: [facility];  Service: Gynecology;  Laterality: N/A;    Social History   Tobacco Use   Smoking status: Former    Types: Cigarettes    Quit date: 01/02/2006    Years since quitting: 15.8   Smokeless tobacco: Never  Vaping Use   Vaping Use: Never used  Substance Use Topics   Alcohol use: Yes    Comment: occ.   Drug  use: No     Medication list has been reviewed and updated.  No outpatient medications have been marked as taking for the 10/21/21 encounter (Office Visit) with Juline Patch, MD.       10/21/2021    9:57 AM 10/04/2021    8:21 AM 03/14/2021    1:26 PM 06/22/2020    9:57 AM  GAD 7 : Generalized Anxiety Score  Nervous, Anxious, on Edge 0 0 0 0  Control/stop worrying 0 0 0 0  Worry too much - different things 0 0 0 0  Trouble relaxing 0 0 0 0  Restless 0 0 0 0  Easily annoyed or irritable 0 0 0 0  Afraid - awful might happen 0 0 0 0  Total GAD 7 Score 0 0 0 0  Anxiety  Difficulty Not difficult at all Not difficult at all Not difficult at all        10/21/2021    9:56 AM 10/04/2021    8:21 AM 03/14/2021    1:26 PM  Depression screen PHQ 2/9  Decreased Interest 0 0 0  Down, Depressed, Hopeless 0 0 0  PHQ - 2 Score 0 0 0  Altered sleeping 0 0 0  Tired, decreased energy 0 0 0  Change in appetite 0 0 0  Feeling bad or failure about yourself  0 0 0  Trouble concentrating 0 0 0  Moving slowly or fidgety/restless 0 0 0  Suicidal thoughts 0 0 0  PHQ-9 Score 0 0 0  Difficult doing work/chores Not difficult at all Not difficult at all Not difficult at all    BP Readings from Last 3 Encounters:  10/21/21 120/80  10/14/21 (!) 143/87  10/04/21 (!) 138/104    Physical Exam Vitals and nursing note reviewed.  Constitutional:      General: She is not in acute distress.    Appearance: She is not diaphoretic.  HENT:     Head: Normocephalic and atraumatic.     Jaw: There is normal jaw occlusion.     Right Ear: Hearing, tympanic membrane, ear canal and external ear normal.     Left Ear: Hearing, tympanic membrane, ear canal and external ear normal.     Nose: Nose normal. No congestion.     Mouth/Throat:     Lips: Pink.     Mouth: Mucous membranes are moist.     Dentition: Normal dentition.     Tongue: No lesions.     Palate: No mass.     Pharynx: Oropharynx is clear. Uvula midline.  Eyes:     General: Lids are normal. Vision grossly intact. Gaze aligned appropriately.        Right eye: No discharge.        Left eye: No discharge.     Extraocular Movements: Extraocular movements intact.     Conjunctiva/sclera: Conjunctivae normal.     Pupils: Pupils are equal, round, and reactive to light.  Neck:     Thyroid: No thyromegaly.     Vascular: Normal carotid pulses. No carotid bruit, hepatojugular reflux or JVD.     Trachea: Trachea and phonation normal.  Cardiovascular:     Rate and Rhythm: Normal rate and regular rhythm.     Chest Wall: PMI is not  displaced.     Heart sounds: Normal heart sounds, S1 normal and S2 normal. No murmur heard.    No systolic murmur is present.     No diastolic murmur is present.  No friction rub. No gallop. No S3 or S4 sounds.  Pulmonary:     Effort: Pulmonary effort is normal.     Breath sounds: Normal breath sounds. No decreased breath sounds, wheezing, rhonchi or rales.  Abdominal:     General: Bowel sounds are normal.     Palpations: Abdomen is soft. There is no hepatomegaly, splenomegaly or mass.     Tenderness: There is no abdominal tenderness. There is no guarding.  Musculoskeletal:        General: Normal range of motion.     Cervical back: Normal range of motion and neck supple.  Lymphadenopathy:     Cervical: No cervical adenopathy.     Right cervical: No superficial, deep or posterior cervical adenopathy.    Left cervical: No superficial, deep or posterior cervical adenopathy.     Upper Body:     Right upper body: No supraclavicular adenopathy.     Left upper body: No supraclavicular adenopathy.  Skin:    General: Skin is warm and dry.     Capillary Refill: Capillary refill takes less than 2 seconds.  Neurological:     Mental Status: She is alert.     Cranial Nerves: Cranial nerves 2-12 are intact.     Sensory: Sensation is intact.     Motor: Motor function is intact.     Deep Tendon Reflexes: Reflexes are normal and symmetric.     Reflex Scores:      Tricep reflexes are 2+ on the right side and 2+ on the left side.      Bicep reflexes are 2+ on the right side and 2+ on the left side.      Brachioradialis reflexes are 2+ on the right side and 2+ on the left side.      Patellar reflexes are 2+ on the right side and 2+ on the left side.      Achilles reflexes are 2+ on the right side and 2+ on the left side.    Wt Readings from Last 3 Encounters:  10/21/21 191 lb (86.6 kg)  10/14/21 188 lb 12.8 oz (85.6 kg)  10/04/21 184 lb 9.6 oz (83.7 kg)    BP 120/80   Pulse 64   Ht 5'  5" (1.651 m)   Wt 191 lb (86.6 kg)   LMP 01/08/2015 (Exact Date)   BMI 31.78 kg/m   Assessment and Plan:  1. Annual physical exam JENNETTA FLOOD is a 41 y.o. female who presents today for her Complete Annual Exam. She feels well. She reports exercising . She reports she is sleeping well.  Immunizations are reviewed and recommendations provided.   Age appropriate screening tests are discussed. Counseling given for risk factor reduction interventions.  No subjective/objective concerns noted during review of the present illness, review of past medical history, medication renewal review of systems, and physical exam - Lipid Panel With LDL/HDL Ratio - Comprehensive Metabolic Panel (CMET) - POCT Occult Blood Stool    Otilio Miu, MD

## 2021-11-12 DIAGNOSIS — Z7189 Other specified counseling: Secondary | ICD-10-CM | POA: Diagnosis not present

## 2021-11-12 DIAGNOSIS — X32XXXA Exposure to sunlight, initial encounter: Secondary | ICD-10-CM | POA: Diagnosis not present

## 2021-11-12 DIAGNOSIS — D2271 Melanocytic nevi of right lower limb, including hip: Secondary | ICD-10-CM | POA: Diagnosis not present

## 2021-11-12 DIAGNOSIS — L570 Actinic keratosis: Secondary | ICD-10-CM | POA: Diagnosis not present

## 2021-11-12 DIAGNOSIS — L218 Other seborrheic dermatitis: Secondary | ICD-10-CM | POA: Diagnosis not present

## 2021-11-12 DIAGNOSIS — Z09 Encounter for follow-up examination after completed treatment for conditions other than malignant neoplasm: Secondary | ICD-10-CM | POA: Diagnosis not present

## 2021-11-12 DIAGNOSIS — D225 Melanocytic nevi of trunk: Secondary | ICD-10-CM | POA: Diagnosis not present

## 2021-11-12 DIAGNOSIS — L718 Other rosacea: Secondary | ICD-10-CM | POA: Diagnosis not present

## 2021-11-12 DIAGNOSIS — Z872 Personal history of diseases of the skin and subcutaneous tissue: Secondary | ICD-10-CM | POA: Diagnosis not present

## 2021-11-18 ENCOUNTER — Ambulatory Visit
Admission: RE | Admit: 2021-11-18 | Discharge: 2021-11-18 | Disposition: A | Discharge status: Home / Self Care | Payer: PRIVATE HEALTH INSURANCE | Source: Ambulatory Visit | Attending: Physician Assistant | Admitting: Physician Assistant

## 2021-11-18 ENCOUNTER — Other Ambulatory Visit: Payer: Self-pay | Admitting: Physician Assistant

## 2021-11-18 DIAGNOSIS — T1490XA Injury, unspecified, initial encounter: Secondary | ICD-10-CM | POA: Insufficient documentation

## 2021-12-09 ENCOUNTER — Telehealth: Payer: Self-pay

## 2021-12-09 ENCOUNTER — Ambulatory Visit: Payer: 59 | Admitting: Family Medicine

## 2021-12-09 ENCOUNTER — Encounter: Payer: Self-pay | Admitting: Family Medicine

## 2021-12-09 VITALS — BP 128/79 | HR 80 | Ht 65.0 in | Wt 190.0 lb

## 2021-12-09 DIAGNOSIS — N6452 Nipple discharge: Secondary | ICD-10-CM | POA: Diagnosis not present

## 2021-12-09 MED ORDER — AMOXICILLIN-POT CLAVULANATE 875-125 MG PO TABS: 1.0000 | ORAL_TABLET | Freq: Two times a day (BID) | ORAL | 0 refills | Status: DC

## 2021-12-09 NOTE — Patient Instructions (Signed)
Ductal Carcinoma In Situ  Ductal carcinoma in situ is the presence of abnormal cells in the breast. It is the earliest form of breast cancer. The abnormal cells are located only in the tubes that carry milk to the nipple (milk ducts) and have not spread to other areas. What are the causes? The exact cause of ductal carcinoma in situ is unknown. What increases the risk? The following factors increase the risk of developing ductal carcinoma in situ: Being older than 43 years of age. Being female. Having a family history of breast cancer. Starting menopause after age 64. Starting your menstrual periods before age 13. Having never been pregnant or having your first child after age 42. Having never breastfed. A personal history of: Breast cancer. Dense breast tissue. Radiation treatments to the breasts or chest area. Having the BRCA1 and BRCA2 genes. Having certain types of noncancerous (benign) breast conditions, such as non-proliferative lesions, proliferative lesions with or without atypia (cell abnormalities), or lobular carcinoma in situ. Exposure to the drug DES, which was given to pregnant women from the 1940s to the 1970s. Other risks include: Current or past hormone use, such as: Using birth control. Taking hormone therapy after menopause. Being overweight or obese after menopause. Having an inactive (sedentary) lifestyle. Drinking more than one alcoholic beverage a day. What are the signs or symptoms? Ductal carcinoma in situ does not cause any symptoms. How is this diagnosed? Ductal carcinoma in situ is usually discovered during a routine X-ray of the breasts to check for abnormal changes (mammogram). To diagnose the condition, your health care provider may do an ultrasound and remove a tissue sample from your breast so it can be examined under a microscope (breast biopsy). Your health care provider may also remove one or more lymph nodes from under your arm to check if the  abnormal cells have spread to your lymph nodes (sentinel lymph node biopsy). Lymph nodes are part of the body's disease-fighting system (immune system). They are located throughout the body. The lymph nodes under the arms are usually the first place where abnormal cells spread. How is this treated? Treatment for this condition may include: A lumpectomy. This is surgery to remove the area of abnormal cells, along with a ring of normal tissue. This may also be called breast-conserving surgery. Simple mastectomy. This is surgery to remove breast tissue, the nipple, and the circle of colored tissue around the nipple (areola). Sometimes, one or more lymph nodes from under the arm are also removed and tested for cancer cells. Preventive mastectomy. This is the removal of both breasts. This is usually done only if you have a very high risk of developing breast cancer. Radiation. This is the use of high-energy rays to kill cancer cells. Hormone therapy, which are medicines to keep the abnormal cells from spreading. Follow these instructions at home: Lifestyle Eat a healthy diet. A healthy diet includes lots of fruits and vegetables, low-fat dairy products, lean meats, and fiber. Make sure half your plate is filled with fruits or vegetables. Choose high-fiber foods such as whole-grain breads and cereals. Do not use any products that contain nicotine or tobacco. These products include cigarettes, chewing tobacco, and vaping devices, such as e-cigarettes. If you need help quitting, ask your health care provider. Alcohol use Do not drink alcohol if: Your health care provider tells you not to drink. You are pregnant, may be pregnant, or are planning to become pregnant. If you drink alcohol: Limit how much you have to 0-1  drink a day. Know how much alcohol is in your drink. In the [place] one drink equals one 12 oz bottle of beer (355 mL), one 5 oz glass of wine (148 mL), or one 1 oz glass of hard liquor (44  mL). General instructions Take over-the-counter and prescription medicines only as told by your health care provider. Keep all follow-up visits. This is important. Where to find more information American Cancer Society: www.cancer.[place]: www.cancer.gov Contact a health care provider if: You have a fever. You notice a new lump in either breast or under your arm. You have any symptoms or changes that concern you. You notice new fatigue or weakness. Get help right away if: You have chest pain or trouble breathing. These symptoms may be an emergency. Get help right away. Call 911. Do not wait to see if the symptoms will go away. Do not drive yourself to the hospital. Summary Ductal carcinoma in situ is the presence of abnormal cells in the breast. It is the earliest form of breast cancer. The exact cause of ductal carcinoma in situ is unknown. Among other factors, the risk increases with age and with current or past hormone use. To diagnose the condition, your health care provider will remove a tissue sample from your breast so it can be examined under a microscope (breast biopsy). This information is not intended to replace advice given to you by your health care provider. Make sure you discuss any questions you have with your health care provider. Document Revised: 11/22/2020 Document Reviewed: 11/22/2020 Elsevier Patient Education  [place].

## 2021-12-09 NOTE — Addendum Note (Signed)
Addended by: Berton Mount L on: 12/09/2021 11:52 AM   Modules accepted: Orders

## 2021-12-09 NOTE — Telephone Encounter (Signed)
Scheduled pt for 12/25/21 mammo and [place] if needed. Also put in ref to Dr Dahlia Byes urgent

## 2021-12-09 NOTE — Progress Notes (Signed)
Date:  12/09/2021   Name:  Denise Bolton   DOB:  12/09/1980   MRN:  [id number]   Chief Complaint: breast issue (R) nipple was sore after getting out of shower on Saturday. Wiped with towel and had pus and blood. Now is scabbed over?)  Patient is a 41 year old female who presents for a right nipple/paranipple discharge/with some blood exam. The patient reports the following problems: green/thick discharge with blood. Health maintenance has been reviewed up to date      Lab Results  Component Value Date   NA 135 10/21/2021   K 4.1 10/21/2021   CO2 22 10/21/2021   GLUCOSE 97 10/21/2021   BUN 14 10/21/2021   CREATININE 0.75 10/21/2021   CALCIUM 8.6 (L) 10/21/2021   EGFR 111 06/22/2020   GFRNONAA >60 10/21/2021   Lab Results  Component Value Date   CHOL 201 (H) 10/21/2021   HDL 71 10/21/2021   LDLCALC 114 (H) 10/21/2021   TRIG 78 10/21/2021   CHOLHDL 2.8 10/21/2021   No results found for: "TSH" No results found for: "HGBA1C" Lab Results  Component Value Date   WBC 7.2 01/12/2015   HGB 10.4 (L) 01/12/2015   HCT 31.7 (L) 01/12/2015   MCV 91.9 01/12/2015   PLT 243 01/12/2015   Lab Results  Component Value Date   ALT 16 10/21/2021   AST 16 10/21/2021   ALKPHOS 45 10/21/2021   BILITOT 1.3 (H) 10/21/2021   No results found for: "25OHVITD2", "25OHVITD3", "VD25OH"   Review of Systems  Respiratory:  Negative for chest tightness and shortness of breath.   Cardiovascular:  Positive for chest pain. Negative for palpitations and leg swelling.       Right breast tenderness/nipple swelling    Patient Active Problem List   Diagnosis Date Noted   Annual physical exam 04/08/2019   Recurrent major depressive disorder, in partial remission ([place]) 06/22/2017   S/P laparoscopic hysterectomy 01/11/2015   Iron deficiency anemia 08/22/2014   Palpitations 07/11/2013   Preeclampsia 07/11/2013    No Known Allergies  Past Surgical History:  Procedure Laterality Date    ABDOMINAL HYSTERECTOMY     ABDOMINOPLASTY     ABDOMINOPLASTY  2007   FATTY TISSUE REMOVED   AUGMENTATION MAMMAPLASTY Bilateral 2007   BREAST SURGERY     augmentation   CESAREAN SECTION     x 2   CYSTOSCOPY  01/11/2015   Procedure: CYSTOSCOPY;  Surgeon: Malachy Mood, MD;  Location: [facility];  Service: Gynecology;;   LAPAROSCOPIC BILATERAL SALPINGECTOMY Bilateral 01/11/2015   Procedure: LAPAROSCOPIC BILATERAL SALPINGECTOMY;  Surgeon: Malachy Mood, MD;  Location: [facility];  Service: Gynecology;  Laterality: Bilateral;   LAPAROSCOPIC HYSTERECTOMY N/A 01/11/2015   Procedure: HYSTERECTOMY TOTAL LAPAROSCOPIC;  Surgeon: Malachy Mood, MD;  Location: [facility];  Service: Gynecology;  Laterality: N/A;    Social History   Tobacco Use   Smoking status: Former    Types: Cigarettes    Quit date: 01/02/2006    Years since quitting: 15.9   Smokeless tobacco: Never  Vaping Use   Vaping Use: Never used  Substance Use Topics   Alcohol use: Yes    Comment: occ.   Drug use: No     Medication list has been reviewed and updated.  No outpatient medications have been marked as taking for the 12/09/21 encounter (Office Visit) with Juline Patch, MD.       12/09/2021   10:17 AM 10/21/2021    9:57  AM 10/04/2021    8:21 AM 03/14/2021    1:26 PM  GAD 7 : Generalized Anxiety Score  Nervous, Anxious, on Edge 0 0 0 0  Control/stop worrying 0 0 0 0  Worry too much - different things 0 0 0 0  Trouble relaxing 0 0 0 0  Restless 0 0 0 0  Easily annoyed or irritable 0 0 0 0  Afraid - awful might happen 0 0 0 0  Total GAD 7 Score 0 0 0 0  Anxiety Difficulty Not difficult at all Not difficult at all Not difficult at all Not difficult at all       12/09/2021   10:17 AM 10/21/2021    9:56 AM 10/04/2021    8:21 AM  Depression screen PHQ 2/9  Decreased Interest 0 0 0  Down, Depressed, Hopeless 0 0 0  PHQ - 2 Score 0 0 0  Altered sleeping 0 0 0  Tired, decreased energy 0 0 0  Change in  appetite 0 0 0  Feeling bad or failure about yourself  0 0 0  Trouble concentrating 0 0 0  Moving slowly or fidgety/restless 0 0 0  Suicidal thoughts 0 0 0  PHQ-9 Score 0 0 0  Difficult doing work/chores Not difficult at all Not difficult at all Not difficult at all    BP Readings from Last 3 Encounters:  12/09/21 128/79  10/21/21 120/80  10/14/21 (!) 143/87    Physical Exam Vitals and nursing note reviewed. Exam conducted with a chaperone present.  Constitutional:      General: She is not in acute distress.    Appearance: She is not diaphoretic.  HENT:     Head: Normocephalic and atraumatic.     Right Ear: External ear normal.     Left Ear: External ear normal.     Nose: Nose normal.  Eyes:     General:        Right eye: No discharge.        Left eye: No discharge.     Conjunctiva/sclera: Conjunctivae normal.     Pupils: Pupils are equal, round, and reactive to light.  Neck:     Thyroid: No thyromegaly.     Vascular: No JVD.  Cardiovascular:     Rate and Rhythm: Normal rate and regular rhythm.     Heart sounds: Normal heart sounds. No murmur heard.    No friction rub. No gallop.  Pulmonary:     Effort: Pulmonary effort is normal.     Breath sounds: Normal breath sounds.  Chest:  Breasts:    Right: Nipple discharge, skin change and tenderness present. No swelling, bleeding, inverted nipple or mass.  Abdominal:     General: Bowel sounds are normal.     Palpations: Abdomen is soft. There is no mass.     Tenderness: There is no abdominal tenderness. There is no guarding.  Musculoskeletal:        General: Normal range of motion.     Cervical back: Normal range of motion and neck supple.  Lymphadenopathy:     Cervical: No cervical adenopathy.     Upper Body:     Right upper body: Axillary adenopathy present.  Skin:    General: Skin is warm and dry.  Neurological:     Mental Status: She is alert.     Wt Readings from Last 3 Encounters:  12/09/21 190 lb (86.2  kg)  10/21/21 191 lb (86.6 kg)  10/14/21  188 lb 12.8 oz (85.6 kg)    BP 128/79   Pulse 80   Ht $R'5\' 5"'ea$  (1.651 m)   Wt 190 lb (86.2 kg)   LMP 01/08/2015 (Exact Date)   BMI 31.62 kg/m   Assessment and Plan: 1. Nipple discharge, bloody Chronic nipple discharge which has been milky and serous in the past followed by GYN.  However new episode of thick green with blood noted.  Tenderness was noted earlier but none now.  This may have been secondary to a mastitis/abscess we will treat with Augmentin 875 mg twice a day but we will proceed with diagnostic mammogram with possibility of ultrasound.  We have also discussed the possibility of involving general surgery/Dr. Dahlia Byes to evaluate for the possibility of a ductal concern. - MM DIAG BREAST W/IMPLANT TOMO UNI R - US BREAST LTD UNI RIGHT INC AXILLA - amoxicillin-clavulanate (AUGMENTIN) 875-125 MG tablet; Take 1 tablet by mouth 2 (two) times daily.  Dispense: 20 tablet; Refill: 0     Otilio Miu, MD

## 2021-12-16 ENCOUNTER — Encounter: Payer: Self-pay | Admitting: Surgery

## 2021-12-16 ENCOUNTER — Ambulatory Visit: Payer: 59 | Admitting: Surgery

## 2021-12-16 VITALS — BP 145/89 | HR 67 | Temp 98.1°F | Wt 186.4 lb

## 2021-12-16 DIAGNOSIS — N6452 Nipple discharge: Secondary | ICD-10-CM

## 2021-12-16 NOTE — Patient Instructions (Addendum)
If you have any concerns or questions, please feel free to call our office. See follow up appointment below.   Breast Self-Awareness Breast self-awareness is knowing how your breasts look and feel. You need to: Check your breasts on a regular basis. Tell your doctor about any changes. Become familiar with the look and feel of your breasts. This can help you catch a breast problem while it is still small and can be treated. You should do breast self-exams even if you have breast implants. What you need: A mirror. A well-lit room. A pillow or other soft object. How to do a breast self-exam Follow these steps to do a breast self-exam: Look for changes  Take off all the clothes above your waist. Stand in front of a mirror in a room with good lighting. Put your hands down at your sides. Compare your breasts in the mirror. Look for any difference between them, such as: A difference in shape. A difference in size. Wrinkles, dips, and bumps in one breast and not the other. Look at each breast for changes in the skin, such as: Redness. Scaly areas. Skin that has gotten thicker. Dimpling. Open sores (ulcers). Look for changes in your nipples, such as: Fluid coming out of a nipple. Fluid around a nipple. Bleeding. Dimpling. Redness. A nipple that looks pushed in (retracted), or that has changed position. Feel for changes Lie on your back. Feel each breast. To do this: Pick a breast to feel. Place a pillow under the shoulder closest to that breast. Put the arm closest to that breast behind your head. Feel the nipple area of that breast using the hand of your other arm. Feel the area with the pads of your three middle fingers by making small circles with your fingers. Use light, medium, and firm pressure. Continue the overlapping circles, moving downward over the breast. Keep making circles with your fingers. Stop when you feel your ribs. Start making circles with your fingers again,  this time going upward until you reach your collarbone. Then, make circles outward across your breast and into your armpit area. Squeeze your nipple. Check for discharge and lumps. Repeat these steps to check your other breast. Sit or stand in the tub or shower. With soapy water on your skin, feel each breast the same way you did when you were lying down. Write down what you find Writing down what you find can help you remember what to tell your doctor. Write down: What is normal for each breast. Any changes you find in each breast. These include: The kind of changes you find. A tender or painful breast. Any lump you find. Write down its size and where it is. When you last had your monthly period (menstrual cycle). General tips If you are breastfeeding, the best time to check your breasts is after you feed your baby or after you use a breast pump. If you get monthly bleeding, the best time to check your breasts is 5-7 days after your monthly cycle ends. With time, you will become comfortable with the self-exam. You will also start to know if there are changes in your breasts. Contact a doctor if: You see a change in the shape or size of your breasts or nipples. You see a change in the skin of your breast or nipples, such as red or scaly skin. You have fluid coming from your nipples that is not normal. You find a new lump or thick area. You have breast pain. You  have any concerns about your breast health. Summary Breast self-awareness includes looking for changes in your breasts and feeling for changes within your breasts. You should do breast self-awareness in front of a mirror in a well-lit room. If you get monthly periods (menstrual cycles), the best time to check your breasts is 5-7 days after your period ends. Tell your doctor about any changes you see in your breasts. Changes include changes in size, changes on the skin, painful or tender breasts, or fluid from your nipples that is  not normal. This information is not intended to replace advice given to you by your health care provider. Make sure you discuss any questions you have with your health care provider. Document Revised: 12/20/2020 Document Reviewed: 12/20/2020 Elsevier Patient Education  [employer].

## 2021-12-18 NOTE — Progress Notes (Signed)
Patient ID: Denise Bolton, female   DOB: [birth year], 41 y.o.   MRN: [id number]  HPI Denise Bolton is a 41 y.o. female seen in consultation at the request of Dr. Ronnald Ramp for abnormal right nipple discharge.  She reports that it started a few weeks ago it is intermittent and has stopped. The colon was green and red.  She denies any breast pain.  She denies any family history of breast cancer.  She did have a history of augmentation mammoplasty several years ago. Her mother did have ovarian ca. Recent CMP is completely normal. Did have a mammogram that I personally reviewed nearly in the year showing no evidence of suspicious lesions in either breast.  She Does have retropectoral saline implants.   HPI  Past Medical History:  Diagnosis Date   Anemia    BRCA negative    Family history of ovarian cancer    MOTHER   Genetic testing of female 10/2014   IBIS=11.1%   GERD (gastroesophageal reflux disease)    Hypertension    Menorrhagia     Past Surgical History:  Procedure Laterality Date   ABDOMINAL HYSTERECTOMY     ABDOMINOPLASTY     ABDOMINOPLASTY  2007   FATTY TISSUE REMOVED   AUGMENTATION MAMMAPLASTY Bilateral 2007   BREAST SURGERY     augmentation   CESAREAN SECTION     x 2   CYSTOSCOPY  01/11/2015   Procedure: CYSTOSCOPY;  Surgeon: Malachy Mood, MD;  Location: [facility];  Service: Gynecology;;   LAPAROSCOPIC BILATERAL SALPINGECTOMY Bilateral 01/11/2015   Procedure: LAPAROSCOPIC BILATERAL SALPINGECTOMY;  Surgeon: Malachy Mood, MD;  Location: [facility];  Service: Gynecology;  Laterality: Bilateral;   LAPAROSCOPIC HYSTERECTOMY N/A 01/11/2015   Procedure: HYSTERECTOMY TOTAL LAPAROSCOPIC;  Surgeon: Malachy Mood, MD;  Location: [facility];  Service: Gynecology;  Laterality: N/A;    Family History  Problem Relation Age of Onset   Ovarian cancer Mother 4       DECEASED 70   Cancer Mother 3       LUNG   Melanoma Father 24   Breast cancer Neg Hx     Social  History Social History   Tobacco Use   Smoking status: Former    Types: Cigarettes    Quit date: 01/02/2006    Years since quitting: 15.9   Smokeless tobacco: Never  Vaping Use   Vaping Use: Never used  Substance Use Topics   Alcohol use: Yes    Comment: occ.   Drug use: No    No Known Allergies  Current Outpatient Medications  Medication Sig Dispense Refill   amoxicillin-clavulanate (AUGMENTIN) 875-125 MG tablet Take 1 tablet by mouth 2 (two) times daily. 20 tablet 0   No current facility-administered medications for this visit.     Review of Systems Full ROS  was asked and was negative except for the information on the HPI  Physical Exam Blood pressure (!) 145/89, pulse 67, temperature 98.1 F (36.7 C), temperature source Oral, weight 186 lb 6.4 oz (84.6 kg), last menstrual period 01/08/2015, SpO2 97 %. CONSTITUTIONAL: NAD. EYES: Pupils are equal, round,  Sclera are non-icteric. EARS, NOSE, MOUTH AND THROAT: The oral mucosa is pink and moist. Hearing is intact to voice. LYMPH NODES:  Lymph nodes in the neck are normal. RESPIRATORY:  Lungs are clear. There is normal respiratory effort, with equal breath sounds bilaterally, and without pathologic use of accessory muscles. CARDIOVASCULAR: Heart is regular without murmurs, gallops, or rubs. BREAST: There is  evidence of periareolar scar from reduction mammoplasty bilaterally.  There is evidence of a small scab located on the right breast at 3:00.  No evidence of any palpable lesions on either breast.  There is no evidence of lymphadenopathy. GI: The abdomen is  soft, nontender, and nondistended. There are no palpable masses. There is no hepatosplenomegaly. There are normal bowel sounds in all quadrants. GU: Rectal deferred.   MUSCULOSKELETAL: Normal muscle strength and tone. No cyanosis or edema.   SKIN: Turgor is good and there are no pathologic skin lesions or ulcers. NEUROLOGIC: Motor and sensation is grossly normal.  Cranial nerves are grossly intact. PSYCH:  Oriented to person, place and time. Affect is normal.  Data Reviewed  I have personally reviewed the patient's imaging, laboratory findings and medical records.    Assessment/Plan 41 year old female with an abnormal right nipple discharge.  Etiologies will include intraductal papilloma versus physiologic nipple discharge.  I agree with further work-up including ultrasound and mammogram.  We also discussed about potential follow-up MRI if mammo or ultrasound is inconclusive.  We talked about etiologies of nipple discharge.  At this time I do not palpate any obvious lesions within the breast that make me suspicious for malignancy.  We will follow her after she completes her diagnostic mammograms.  Depending on findings may need biopsy. As noted I spent 40 minutes in this encounter including personally reviewing imaging studies, coordinating care, placing orders, and performing appropriate documentation A Copy of this report was sent to the referring provider  Caroleen Hamman, MD FACS General Surgeon 12/18/2021, 1:23 PM

## 2021-12-25 ENCOUNTER — Ambulatory Visit
Admission: RE | Admit: 2021-12-25 | Discharge: 2021-12-25 | Disposition: A | Discharge status: Home / Self Care | Payer: 59 | Source: Ambulatory Visit | Attending: Family Medicine | Admitting: Family Medicine

## 2021-12-25 DIAGNOSIS — N6452 Nipple discharge: Secondary | ICD-10-CM | POA: Insufficient documentation

## 2021-12-25 DIAGNOSIS — R921 Mammographic calcification found on diagnostic imaging of breast: Secondary | ICD-10-CM | POA: Diagnosis not present

## 2021-12-26 ENCOUNTER — Other Ambulatory Visit: Payer: Self-pay | Admitting: Family Medicine

## 2021-12-26 DIAGNOSIS — R928 Other abnormal and inconclusive findings on diagnostic imaging of breast: Secondary | ICD-10-CM

## 2021-12-26 DIAGNOSIS — R921 Mammographic calcification found on diagnostic imaging of breast: Secondary | ICD-10-CM

## 2022-01-01 ENCOUNTER — Ambulatory Visit: Payer: 59 | Admitting: Surgery

## 2022-01-07 ENCOUNTER — Ambulatory Visit
Admission: RE | Admit: 2022-01-07 | Discharge: 2022-01-07 | Disposition: A | Discharge status: Home / Self Care | Payer: 59 | Source: Ambulatory Visit | Attending: Family Medicine | Admitting: Family Medicine

## 2022-01-07 DIAGNOSIS — R921 Mammographic calcification found on diagnostic imaging of breast: Secondary | ICD-10-CM | POA: Insufficient documentation

## 2022-01-07 DIAGNOSIS — R928 Other abnormal and inconclusive findings on diagnostic imaging of breast: Secondary | ICD-10-CM | POA: Insufficient documentation

## 2022-01-07 DIAGNOSIS — N6081 Other benign mammary dysplasias of right breast: Secondary | ICD-10-CM | POA: Diagnosis not present

## 2022-01-07 HISTORY — PX: BREAST BIOPSY: SHX20

## 2022-01-08 LAB — SURGICAL PATHOLOGY

## 2022-01-15 ENCOUNTER — Encounter: Payer: Self-pay | Admitting: Surgery

## 2022-01-15 ENCOUNTER — Ambulatory Visit: Payer: 59 | Admitting: Surgery

## 2022-01-15 VITALS — BP 153/98 | HR 78 | Temp 98.4°F | Ht 65.0 in | Wt 186.8 lb

## 2022-01-15 DIAGNOSIS — N6091 Unspecified benign mammary dysplasia of right breast: Secondary | ICD-10-CM

## 2022-01-15 NOTE — Patient Instructions (Signed)
We have spoken today about removing a lump in your breast. This will be done by Dr. Dahlia Byes at [facility].  You will most likely be able to leave the hospital several hours after your surgery. Rarely, a patient needs to stay over night but this is a possibility.  Plan to tenatively be off work for 1-2 weeks following the surgery and may return with approximately 4 more weeks of a lifting restriction, no greater than 15 lbs.  Please see your Blue surgery sheet for more information. Our surgery scheduler will call you to look at surgery dates and to go over information.     Lumpectomy A lumpectomy is a form of "breast conserving" or "breast preservation" surgery. It may also be referred to as a partial mastectomy. During a lumpectomy, the portion of the breast that contains the cancerous tumor or breast mass (the lump) is removed. Some normal tissue around the lump may also be removed to make sure all of the tumor has been removed.  LET [facility] CARE PROVIDER KNOW ABOUT: Any allergies you have. All medicines you are taking, including vitamins, herbs, eye drops, creams, and over-the-counter medicines. Previous problems you or members of your family have had with the use of anesthetics. Any blood disorders you have. Previous surgeries you have had. Medical conditions you have. RISKS AND COMPLICATIONS Generally, this is a safe procedure. However, problems can occur and include: Bleeding. Infection. Pain. Temporary swelling. Change in the shape of the breast, particularly if a large portion is removed. BEFORE THE PROCEDURE Ask your health care provider about changing or stopping your regular medicines. This is especially important if you are taking diabetes medicines or blood thinners. Do not eat or drink anything after midnight on the night before the procedure or as directed by your health care provider. Ask your health care provider if you can take a sip of water with any approved medicines. On  the day of surgery, your health care provider will use a mammogram or ultrasound to locate and mark the tumor in your breast. These markings on your breast will show where the cut (incision) will be made. PROCEDURE  An IV tube will be put into one of your veins. You may be given medicine to help you relax before the surgery (sedative). You will be given one of the following: A medicine that numbs the area (local anesthetic). A medicine that makes you fall asleep (general anesthetic). Your health care provider will use a kind of electric scalpel that uses heat to minimize bleeding (electrocautery knife). A curved incision (like a smile or frown) that follows the natural curve of your breast is made, to allow for minimal scarring and better healing. The tumor will be removed with some of the surrounding tissue. This will be sent to the lab for analysis. Your health care provider may also remove your lymph nodes at this time if needed. Sometimes, but not always, a rubber tube called a drain will be surgically inserted into your breast area or armpit to collect excess fluid that may accumulate in the space where the tumor was. This drain is connected to a plastic bulb on the outside of your body. This drain creates suction to help remove the fluid. The incisions will be closed with stitches (sutures). A bandage may be placed over the incisions. AFTER THE PROCEDURE You will be taken to the recovery area. You will be given medicine for pain. A small rubber drain may be placed in the breast for  2-3 days to prevent a collection of blood (hematoma) from developing in the breast. You will be given instructions on caring for the drain before you go home. A pressure bandage (dressing) will be applied for 1-2 days to prevent bleeding. Ask your health care provider how to care for your bandage at home.   This information is not intended to replace advice given to you by your health care provider. Make sure you  discuss any questions you have with your health care provider.   Document Released: 03/31/2006 Document Revised: 03/10/2014 Document Reviewed: 07/23/2012 Elsevier Interactive Patient Education [employer].

## 2022-01-15 NOTE — H&P (View-Only) (Signed)
Outpatient Surgical Follow Up  01/15/2022  Denise Bolton is an 41 y.o. female.   Chief Complaint  Patient presents with   Follow-up    Had bx 01/07/22    HPI: Is a 41-year-old female well-known to me with history of right nipple discharge.  She has had a prior history of bilateral breast augmentation over 2 decades ago.  Implant is underneath the muscle per her report.  I do not have any operative reports available. She Did have asymmetry on the right breast upper outer quadrant that prompted a biopsy. There was  specific area of atypical lobular hyperplasia evidence of dysplasia or malignancy. PSEUDOANGIOMATOUS STROMAL HYPERPLASIA.  - FOCAL LOBULAR NEOPLASIA (ATYPICAL LOBULAR HYPERPLASIA).  - BACKGROUND BENIGN MAMMARY PARENCHYMA WITH FIBROCYSTIC AND  FIBROADENOMATOID CHANGES, COLUMNAR CELL CHANGE WITHOUT ATYPIA, AND FOCAL  USUAL DUCTAL HYPERPLASIA.  - CALCIFICATIONS ASSOCIATED WITH BENIGN MAMMARY ELEMENTS.  - NEGATIVE FOR ATYPICAL DUCTAL HYPERPLASIA, CARCINOMA IN SITU, AND  MALIGNANCY.   SHe is here to have a discussion regarding breast pathology.  Past Medical History:  Diagnosis Date   Anemia    BRCA negative    Family history of ovarian cancer    MOTHER   Genetic testing of female 10/2014   IBIS=11.1%   GERD (gastroesophageal reflux disease)    Hypertension    Menorrhagia     Past Surgical History:  Procedure Laterality Date   ABDOMINAL HYSTERECTOMY     ABDOMINOPLASTY     ABDOMINOPLASTY  2007   FATTY TISSUE REMOVED   AUGMENTATION MAMMAPLASTY Bilateral 2007   BREAST BIOPSY Right 01/07/2022   stereo bx, ribbon marker, path pending   BREAST BIOPSY Right 01/07/2022   MM RT BREAST BX W LOC DEV 1ST LESION IMAGE BX SPEC STEREO GUIDE 01/07/2022 [facility]   BREAST SURGERY     augmentation   CESAREAN SECTION     x 2   CYSTOSCOPY  01/11/2015   Procedure: CYSTOSCOPY;  Surgeon: Andreas Staebler, MD;  Location: [facility];  Service: Gynecology;;   LAPAROSCOPIC  BILATERAL SALPINGECTOMY Bilateral 01/11/2015   Procedure: LAPAROSCOPIC BILATERAL SALPINGECTOMY;  Surgeon: Andreas Staebler, MD;  Location: [facility];  Service: Gynecology;  Laterality: Bilateral;   LAPAROSCOPIC HYSTERECTOMY N/A 01/11/2015   Procedure: HYSTERECTOMY TOTAL LAPAROSCOPIC;  Surgeon: Andreas Staebler, MD;  Location: [facility];  Service: Gynecology;  Laterality: N/A;    Family History  Problem Relation Age of Onset   Ovarian cancer Mother 70       DECEASED 2014   Cancer Mother 70       LUNG   Melanoma Father 52   Breast cancer Neg Hx     Social History:  reports that she quit smoking about 16 years ago. Her smoking use included cigarettes. She has never used smokeless tobacco. She reports current alcohol use. She reports that she does not use drugs.  Allergies: No Known Allergies  Medications reviewed.    ROS Full ROS performed and is otherwise negative other than what is stated in HPI   BP (!) 153/98   Pulse 78   Temp 98.4 F (36.9 C) (Oral)   Ht 5' 5" (1.651 m)   Wt 186 lb 12.8 oz (84.7 kg)   LMP 01/08/2015 (Exact Date)   SpO2 97%   BMI 31.09 kg/m   Physical Exam CONSTITUTIONAL: NAD. EYES: Pupils are equal, round,  Sclera are non-icteric. EARS, NOSE, MOUTH AND THROAT: The oral mucosa is pink and moist. Hearing is intact to voice. LYMPH NODES:  Lymph nodes   in the neck are normal. RESPIRATORY:  Lungs are clear. There is normal respiratory effort, with equal breath sounds bilaterally, and without pathologic use of accessory muscles. CARDIOVASCULAR: Heart is regular without murmurs, gallops, or rubs. BREAST: There is evidence of periareolar scar from reduction mammoplasty bilaterally.  There is evidence of a recent breast biopsy site Right  upper outer quadrant..  No evidence of any palpable lesions on either breast.  There is no evidence of lymphadenopathy. GI: The abdomen is  soft, nontender, and nondistended. There are no palpable masses. There is no  hepatosplenomegaly. There are normal bowel sounds in all quadrants. GU: Rectal deferred.   MUSCULOSKELETAL: Normal muscle strength and tone. No cyanosis or edema.   SKIN: Turgor is good and there are no pathologic skin lesions or ulcers. NEUROLOGIC: Motor and sensation is grossly normal. Cranial nerves are grossly intact. PSYCH:  Oriented to person, place and time. Affect is normal.   Assessment/Plan: 41-year-old female with asymmetry of the right breast and pathology findings consistent with atypical lobular hyperplasia.  We had an extensive discussion with patient regarding the pathology results and potential options.  Close follow-up versus excisional biopsy of the lesion were discussed with the patient and the husband at length.  She seems to have some concerns guarded breast pathology.  After careful consideration she chooses to proceed with RFA/seed wireless localization lumpectomy of the right breast.  Procedure discussed with her in detail.  Specifically we talked about the risk, benefits and possible implications including but not limited to: Deformity, rupture of the implants, pain, active pathology can be upgraded.  He understands and wished to proceed. Spent greater than 40 minutes in this encounter including coordination of her care, counseling the patient and placing orders   Miri Jose, MD FACS General Surgeon 

## 2022-01-15 NOTE — Progress Notes (Signed)
Outpatient Surgical Follow Up  01/15/2022  Denise Bolton is an 41 y.o. female.   Chief Complaint  Patient presents with   Follow-up    Had bx 01/07/22    HPI: Is a 41 year old female well-known to me with history of right nipple discharge.  She has had a prior history of bilateral breast augmentation over 2 decades ago.  Implant is underneath the muscle per her report.  I do not have any operative reports available. She Did have asymmetry on the right breast upper outer quadrant that prompted a biopsy. There was  specific area of atypical lobular hyperplasia evidence of dysplasia or malignancy. PSEUDOANGIOMATOUS STROMAL HYPERPLASIA.  - FOCAL LOBULAR NEOPLASIA (ATYPICAL LOBULAR HYPERPLASIA).  - BACKGROUND BENIGN MAMMARY PARENCHYMA WITH FIBROCYSTIC AND  FIBROADENOMATOID CHANGES, COLUMNAR CELL CHANGE WITHOUT ATYPIA, AND FOCAL  USUAL DUCTAL HYPERPLASIA.  - CALCIFICATIONS ASSOCIATED WITH BENIGN MAMMARY ELEMENTS.  - NEGATIVE FOR ATYPICAL DUCTAL HYPERPLASIA, CARCINOMA IN SITU, AND  MALIGNANCY.   SHe is here to have a discussion regarding breast pathology.  Past Medical History:  Diagnosis Date   Anemia    BRCA negative    Family history of ovarian cancer    MOTHER   Genetic testing of female 10/2014   IBIS=11.1%   GERD (gastroesophageal reflux disease)    Hypertension    Menorrhagia     Past Surgical History:  Procedure Laterality Date   ABDOMINAL HYSTERECTOMY     ABDOMINOPLASTY     ABDOMINOPLASTY  2007   FATTY TISSUE REMOVED   AUGMENTATION MAMMAPLASTY Bilateral 2007   BREAST BIOPSY Right 01/07/2022   stereo bx, ribbon marker, path pending   BREAST BIOPSY Right 01/07/2022   MM RT BREAST BX W LOC DEV 1ST LESION IMAGE BX SPEC STEREO GUIDE 01/07/2022 [facility]   BREAST SURGERY     augmentation   CESAREAN SECTION     x 2   CYSTOSCOPY  01/11/2015   Procedure: CYSTOSCOPY;  Surgeon: Malachy Mood, MD;  Location: [facility];  Service: Gynecology;;   LAPAROSCOPIC  BILATERAL SALPINGECTOMY Bilateral 01/11/2015   Procedure: LAPAROSCOPIC BILATERAL SALPINGECTOMY;  Surgeon: Malachy Mood, MD;  Location: [facility];  Service: Gynecology;  Laterality: Bilateral;   LAPAROSCOPIC HYSTERECTOMY N/A 01/11/2015   Procedure: HYSTERECTOMY TOTAL LAPAROSCOPIC;  Surgeon: Malachy Mood, MD;  Location: [facility];  Service: Gynecology;  Laterality: N/A;    Family History  Problem Relation Age of Onset   Ovarian cancer Mother 67       DECEASED 71   Cancer Mother 31       LUNG   Melanoma Father 80   Breast cancer Neg Hx     Social History:  reports that she quit smoking about 16 years ago. Her smoking use included cigarettes. She has never used smokeless tobacco. She reports current alcohol use. She reports that she does not use drugs.  Allergies: No Known Allergies  Medications reviewed.    ROS Full ROS performed and is otherwise negative other than what is stated in HPI   BP (!) 153/98   Pulse 78   Temp 98.4 F (36.9 C) (Oral)   Ht _0  (1.651 m)   Wt 186 lb 12.8 oz (84.7 kg)   LMP 01/08/2015 (Exact Date)   SpO2 97%   BMI 31.09 kg/m   Physical Exam CONSTITUTIONAL: NAD. EYES: Pupils are equal, round,  Sclera are non-icteric. EARS, NOSE, MOUTH AND THROAT: The oral mucosa is pink and moist. Hearing is intact to voice. LYMPH NODES:  Lymph nodes  in the neck are normal. RESPIRATORY:  Lungs are clear. There is normal respiratory effort, with equal breath sounds bilaterally, and without pathologic use of accessory muscles. CARDIOVASCULAR: Heart is regular without murmurs, gallops, or rubs. BREAST: There is evidence of periareolar scar from reduction mammoplasty bilaterally.  There is evidence of a recent breast biopsy site Right  upper outer quadrant..  No evidence of any palpable lesions on either breast.  There is no evidence of lymphadenopathy. GI: The abdomen is  soft, nontender, and nondistended. There are no palpable masses. There is no  hepatosplenomegaly. There are normal bowel sounds in all quadrants. GU: Rectal deferred.   MUSCULOSKELETAL: Normal muscle strength and tone. No cyanosis or edema.   SKIN: Turgor is good and there are no pathologic skin lesions or ulcers. NEUROLOGIC: Motor and sensation is grossly normal. Cranial nerves are grossly intact. PSYCH:  Oriented to person, place and time. Affect is normal.   Assessment/Plan: 41 year old female with asymmetry of the right breast and pathology findings consistent with atypical lobular hyperplasia.  We had an extensive discussion with patient regarding the pathology results and potential options.  Close follow-up versus excisional biopsy of the lesion were discussed with the patient and the husband at length.  She seems to have some concerns guarded breast pathology.  After careful consideration she chooses to proceed with RFA/seed wireless localization lumpectomy of the right breast.  Procedure discussed with her in detail.  Specifically we talked about the risk, benefits and possible implications including but not limited to: Deformity, rupture of the implants, pain, active pathology can be upgraded.  He understands and wished to proceed. Spent greater than 40 minutes in this encounter including coordination of her care, counseling the patient and placing orders   Caroleen Hamman, MD [place] Surgeon

## 2022-01-16 ENCOUNTER — Other Ambulatory Visit: Payer: Self-pay | Admitting: Surgery

## 2022-01-16 DIAGNOSIS — N6489 Other specified disorders of breast: Secondary | ICD-10-CM

## 2022-01-17 ENCOUNTER — Telehealth: Payer: Self-pay

## 2022-01-17 NOTE — Telephone Encounter (Signed)
Patient has been advised of Pre-Admission date/time, and Surgery date at [facility].  Surgery Date: 02/06/22 Preadmission Testing Date: 01/29/22 (phone 8A-1P)  Patient has been made aware to call [phone], between 1-3:00pm the day before surgery, to find out what time to arrive for surgery.

## 2022-01-29 ENCOUNTER — Encounter
Admission: RE | Admit: 2022-01-29 | Discharge: 2022-01-29 | Disposition: A | Discharge status: Home / Self Care | Payer: 59 | Source: Ambulatory Visit | Attending: Surgery | Admitting: Surgery

## 2022-01-29 HISTORY — DX: Depression, unspecified: F32.A

## 2022-01-29 HISTORY — DX: Headache, unspecified: R51.9

## 2022-01-29 NOTE — Patient Instructions (Addendum)
Your procedure is scheduled on:02-06-22 Thursday Report to the Registration Desk on the 1st floor of the [facility].Then proceed to the 2nd floor Surgery Desk To find out your arrival time, please call [phone] between 1PM - 3PM on:02-05-22 Wednesday If your arrival time is 6:00 am, do not arrive prior to that time as the [facility] entrance doors do not open until 6:00 am.  REMEMBER: Instructions that are not followed completely may result in serious medical risk, up to and including death; or upon the discretion of your surgeon and anesthesiologist your surgery may need to be rescheduled.  Do not eat food OR drink any liquids after midnight the night before surgery.  No gum chewing, lozengers or hard candies.  Do NOT take any medication the day of surgery  One week prior to surgery: Stop Anti-inflammatories (NSAIDS) such as Advil, Aleve, Ibuprofen, Motrin, Naproxen, Naprosyn and Aspirin based products such as Excedrin, Goodys Powder, BC Powder.You may however, take Tylenol if needed for pain up until the day of surgery.  Stop ANY OVER THE COUNTER supplements/vitamins NOW (01-29-22) until after surgery (Fish Oil, B Complex and Magnesium-Calcium-Zinc)  No Alcohol for 24 hours before or after surgery.  No Smoking including e-cigarettes for 24 hours prior to surgery.  No chewable tobacco products for at least 6 hours prior to surgery.  No nicotine patches on the day of surgery.  Do not use any "recreational" drugs for at least a week prior to your surgery.  Please be advised that the combination of cocaine and anesthesia may have negative outcomes, up to and including death. If you test positive for cocaine, your surgery will be cancelled.  On the morning of surgery brush your teeth with toothpaste and water, you may rinse your mouth with mouthwash if you wish. Do not swallow any toothpaste or mouthwash.  Use CHG Soap as directed on instruction sheet.  Do not wear jewelry,  make-up, hairpins, clips or nail polish.  Do not wear lotions, powders, or perfumes.   Do not shave body from the neck down 48 hours prior to surgery just in case you cut yourself which could leave a site for infection.  Also, freshly shaved skin may become irritated if using the CHG soap.  Contact lenses, hearing aids and dentures may not be worn into surgery.  Do not bring valuables to the hospital. [facility] is not responsible for any missing/lost belongings or valuables.    Notify your doctor if there is any change in your medical condition (cold, fever, infection).  Wear comfortable clothing (specific to your surgery type) to the hospital.  After surgery, you can help prevent lung complications by doing breathing exercises.  Take deep breaths and cough every 1-2 hours. Your doctor may order a device called an Incentive Spirometer to help you take deep breaths. When coughing or sneezing, hold a pillow firmly against your incision with both hands. This is called "splinting." Doing this helps protect your incision. It also decreases belly discomfort.  If you are being admitted to the hospital overnight, leave your suitcase in the car. After surgery it may be brought to your room.  If you are being discharged the day of surgery, you will not be allowed to drive home. You will need a responsible adult (18 years or older) to drive you home and stay with you that night.   If you are taking public transportation, you will need to have a responsible adult (18 years or older) with you.  Please confirm with your physician that it is acceptable to use public transportation.   Please call the [facility]. at [phone] if you have any questions about these instructions.  Surgery Visitation Policy:  Patients undergoing a surgery or procedure may have two family members or support persons with them as long as the person is not COVID-19 positive or experiencing its symptoms.    MASKING: Due to an increase in RSV rates and hospitalizations, starting Wednesday, Nov. 15, in patient care areas in which we serve newborns, infants and children, masks will be required for teammates and visitors.  Children ages 59 and under may not visit. This policy affects the following departments only:  [facility] Postpartum area Mother Baby Unit Newborn nursery/Special care nursery  Other areas: Masks continue to be strongly recommended for [place] teammates, visitors and patients in all other areas. Visitation is not restricted outside of the units listed above.

## 2022-02-05 ENCOUNTER — Ambulatory Visit
Admission: RE | Admit: 2022-02-05 | Discharge: 2022-02-05 | Disposition: A | Discharge status: Home / Self Care | Payer: 59 | Source: Ambulatory Visit | Attending: Surgery | Admitting: Surgery

## 2022-02-05 DIAGNOSIS — N6489 Other specified disorders of breast: Secondary | ICD-10-CM | POA: Insufficient documentation

## 2022-02-05 DIAGNOSIS — R928 Other abnormal and inconclusive findings on diagnostic imaging of breast: Secondary | ICD-10-CM | POA: Diagnosis not present

## 2022-02-05 HISTORY — PX: BREAST BIOPSY: SHX20

## 2022-02-05 MED ORDER — CHLORHEXIDINE GLUCONATE CLOTH 2 % EX PADS
6.0000 | MEDICATED_PAD | Freq: Once | CUTANEOUS | Status: AC
  Administered 2022-02-06: 6 via TOPICAL

## 2022-02-05 MED ORDER — LACTATED RINGERS IV SOLN: INTRAVENOUS | Status: DC

## 2022-02-05 MED ORDER — CHLORHEXIDINE GLUCONATE CLOTH 2 % EX PADS: 6.0000 | MEDICATED_PAD | Freq: Once | CUTANEOUS | Status: DC

## 2022-02-05 MED ORDER — FAMOTIDINE 20 MG PO TABS: 20.0000 mg | ORAL_TABLET | Freq: Once | ORAL | Status: AC

## 2022-02-05 MED ORDER — CEFAZOLIN SODIUM-DEXTROSE 2-4 GM/100ML-% IV SOLN
2.0000 g | INTRAVENOUS | Status: AC
  Administered 2022-02-06: 2 g via INTRAVENOUS

## 2022-02-05 MED ORDER — GABAPENTIN 300 MG PO CAPS: 300.0000 mg | ORAL_CAPSULE | ORAL | Status: AC

## 2022-02-05 MED ORDER — ORAL CARE MOUTH RINSE: 15.0000 mL | Freq: Once | OROMUCOSAL | Status: AC

## 2022-02-05 MED ORDER — CELECOXIB 200 MG PO CAPS: 200.0000 mg | ORAL_CAPSULE | ORAL | Status: AC

## 2022-02-05 MED ORDER — CHLORHEXIDINE GLUCONATE 0.12 % MT SOLN: 15.0000 mL | Freq: Once | OROMUCOSAL | Status: AC

## 2022-02-05 MED ORDER — LIDOCAINE HCL (PF) 2 % IJ SOLN
10.0000 mL | Freq: Once | INTRAMUSCULAR | Status: AC
  Administered 2022-02-05: 10 mL
  Filled 2022-02-05: qty 10

## 2022-02-06 ENCOUNTER — Ambulatory Visit
Admission: RE | Admit: 2022-02-06 | Discharge: 2022-02-06 | Disposition: A | Discharge status: Home / Self Care | Payer: 59 | Source: Ambulatory Visit | Attending: Surgery | Admitting: Surgery

## 2022-02-06 ENCOUNTER — Encounter: Payer: Self-pay | Admitting: Surgery

## 2022-02-06 ENCOUNTER — Ambulatory Visit: Payer: 59 | Admitting: Anesthesiology

## 2022-02-06 ENCOUNTER — Other Ambulatory Visit: Payer: Self-pay

## 2022-02-06 ENCOUNTER — Encounter
Admission: RE | Disposition: A | Discharge status: Home / Self Care | Payer: Self-pay | Source: Ambulatory Visit | Attending: Surgery

## 2022-02-06 DIAGNOSIS — Z87891 Personal history of nicotine dependence: Secondary | ICD-10-CM | POA: Diagnosis not present

## 2022-02-06 DIAGNOSIS — K219 Gastro-esophageal reflux disease without esophagitis: Secondary | ICD-10-CM | POA: Insufficient documentation

## 2022-02-06 DIAGNOSIS — N6489 Other specified disorders of breast: Secondary | ICD-10-CM

## 2022-02-06 DIAGNOSIS — N6021 Fibroadenosis of right breast: Secondary | ICD-10-CM | POA: Insufficient documentation

## 2022-02-06 DIAGNOSIS — R928 Other abnormal and inconclusive findings on diagnostic imaging of breast: Secondary | ICD-10-CM | POA: Diagnosis not present

## 2022-02-06 DIAGNOSIS — N6091 Unspecified benign mammary dysplasia of right breast: Secondary | ICD-10-CM | POA: Diagnosis not present

## 2022-02-06 DIAGNOSIS — I10 Essential (primary) hypertension: Secondary | ICD-10-CM | POA: Insufficient documentation

## 2022-02-06 DIAGNOSIS — D0501 Lobular carcinoma in situ of right breast: Secondary | ICD-10-CM | POA: Diagnosis not present

## 2022-02-06 DIAGNOSIS — N6081 Other benign mammary dysplasias of right breast: Secondary | ICD-10-CM | POA: Diagnosis not present

## 2022-02-06 HISTORY — PX: BREAST LUMPECTOMY WITH RADIO FREQUENCY LOCALIZER: SHX6897

## 2022-02-06 SURGERY — BREAST LUMPECTOMY WITH RADIO FREQUENCY LOCALIZER
Anesthesia: General | Laterality: Right

## 2022-02-06 MED ORDER — LACTATED RINGERS IV SOLN: INTRAVENOUS | Status: DC

## 2022-02-06 MED ORDER — ONDANSETRON HCL 4 MG/2ML IJ SOLN: 4.0000 mg | Freq: Once | INTRAMUSCULAR | Status: DC | PRN

## 2022-02-06 MED ORDER — MIDAZOLAM HCL 2 MG/2ML IJ SOLN
INTRAMUSCULAR | Status: DC | PRN
  Administered 2022-02-06: 2 mg via INTRAVENOUS

## 2022-02-06 MED ORDER — FENTANYL CITRATE (PF) 100 MCG/2ML IJ SOLN
INTRAMUSCULAR | Status: AC
  Filled 2022-02-06: qty 2

## 2022-02-06 MED ORDER — BUPIVACAINE LIPOSOME 1.3 % IJ SUSP
INTRAMUSCULAR | Status: AC
  Filled 2022-02-06: qty 20

## 2022-02-06 MED ORDER — PROPOFOL 500 MG/50ML IV EMUL
INTRAVENOUS | Status: DC | PRN
  Administered 2022-02-06: 165 ug/kg/min via INTRAVENOUS

## 2022-02-06 MED ORDER — ACETAMINOPHEN 10 MG/ML IV SOLN
INTRAVENOUS | Status: AC
  Filled 2022-02-06: qty 100

## 2022-02-06 MED ORDER — CEFAZOLIN SODIUM-DEXTROSE 2-4 GM/100ML-% IV SOLN
INTRAVENOUS | Status: AC
  Filled 2022-02-06: qty 100

## 2022-02-06 MED ORDER — BUPIVACAINE LIPOSOME 1.3 % IJ SUSP
INTRAMUSCULAR | Status: DC | PRN
  Administered 2022-02-06: 20 mL

## 2022-02-06 MED ORDER — PROPOFOL 10 MG/ML IV BOLUS
INTRAVENOUS | Status: DC | PRN
  Administered 2022-02-06: 200 mg via INTRAVENOUS

## 2022-02-06 MED ORDER — FENTANYL CITRATE (PF) 100 MCG/2ML IJ SOLN
INTRAMUSCULAR | Status: DC | PRN
  Administered 2022-02-06 (×2): 50 ug via INTRAVENOUS
  Administered 2022-02-06: 100 ug via INTRAVENOUS

## 2022-02-06 MED ORDER — GABAPENTIN 300 MG PO CAPS
ORAL_CAPSULE | ORAL | Status: AC
  Administered 2022-02-06: 300 mg via ORAL
  Filled 2022-02-06: qty 1

## 2022-02-06 MED ORDER — GLYCOPYRROLATE 0.2 MG/ML IJ SOLN
INTRAMUSCULAR | Status: DC | PRN
  Administered 2022-02-06: .2 mg via INTRAVENOUS

## 2022-02-06 MED ORDER — ROCURONIUM BROMIDE 100 MG/10ML IV SOLN
INTRAVENOUS | Status: DC | PRN
  Administered 2022-02-06: 40 mg via INTRAVENOUS
  Administered 2022-02-06: 10 mg via INTRAVENOUS
  Administered 2022-02-06: 40 mg via INTRAVENOUS

## 2022-02-06 MED ORDER — ONDANSETRON HCL 4 MG/2ML IJ SOLN
INTRAMUSCULAR | Status: DC | PRN
  Administered 2022-02-06 (×2): 4 mg via INTRAVENOUS

## 2022-02-06 MED ORDER — ACETAMINOPHEN 10 MG/ML IV SOLN
INTRAVENOUS | Status: DC | PRN
  Administered 2022-02-06: 1000 mg via INTRAVENOUS

## 2022-02-06 MED ORDER — OXYCODONE HCL 5 MG/5ML PO SOLN: 5.0000 mg | Freq: Once | ORAL | Status: DC | PRN

## 2022-02-06 MED ORDER — CHLORHEXIDINE GLUCONATE 0.12 % MT SOLN
OROMUCOSAL | Status: AC
  Administered 2022-02-06: 15 mL via OROMUCOSAL
  Filled 2022-02-06: qty 15

## 2022-02-06 MED ORDER — DEXAMETHASONE SODIUM PHOSPHATE 10 MG/ML IJ SOLN
INTRAMUSCULAR | Status: DC | PRN
  Administered 2022-02-06: 10 mg via INTRAVENOUS

## 2022-02-06 MED ORDER — PROPOFOL 1000 MG/100ML IV EMUL
INTRAVENOUS | Status: AC
  Filled 2022-02-06: qty 100

## 2022-02-06 MED ORDER — FENTANYL CITRATE (PF) 100 MCG/2ML IJ SOLN: 25.0000 ug | INTRAMUSCULAR | Status: DC | PRN

## 2022-02-06 MED ORDER — HYDROCODONE-ACETAMINOPHEN 5-325 MG PO TABS: 1.0000 | ORAL_TABLET | ORAL | 0 refills | Status: DC | PRN

## 2022-02-06 MED ORDER — LIDOCAINE HCL (CARDIAC) PF 100 MG/5ML IV SOSY
PREFILLED_SYRINGE | INTRAVENOUS | Status: DC | PRN
  Administered 2022-02-06: 100 mg via INTRATRACHEAL

## 2022-02-06 MED ORDER — CELECOXIB 200 MG PO CAPS
ORAL_CAPSULE | ORAL | Status: AC
  Administered 2022-02-06: 200 mg via ORAL
  Filled 2022-02-06: qty 1

## 2022-02-06 MED ORDER — BUPIVACAINE-EPINEPHRINE 0.25% -1:200000 IJ SOLN
INTRAMUSCULAR | Status: DC | PRN
  Administered 2022-02-06: 30 mL

## 2022-02-06 MED ORDER — OXYCODONE HCL 5 MG PO TABS: 5.0000 mg | ORAL_TABLET | Freq: Once | ORAL | Status: DC | PRN

## 2022-02-06 MED ORDER — MIDAZOLAM HCL 2 MG/2ML IJ SOLN
INTRAMUSCULAR | Status: AC
  Filled 2022-02-06: qty 2

## 2022-02-06 MED ORDER — ACETAMINOPHEN 10 MG/ML IV SOLN: 1000.0000 mg | Freq: Once | INTRAVENOUS | Status: DC | PRN

## 2022-02-06 MED ORDER — DEXMEDETOMIDINE HCL IN NACL 200 MCG/50ML IV SOLN
INTRAVENOUS | Status: DC | PRN
  Administered 2022-02-06: 8 ug via INTRAVENOUS

## 2022-02-06 MED ORDER — FAMOTIDINE 20 MG PO TABS
ORAL_TABLET | ORAL | Status: AC
  Administered 2022-02-06: 20 mg via ORAL
  Filled 2022-02-06: qty 1

## 2022-02-06 MED ORDER — SUGAMMADEX SODIUM 200 MG/2ML IV SOLN
INTRAVENOUS | Status: DC | PRN
  Administered 2022-02-06: 200 mg via INTRAVENOUS

## 2022-02-06 MED ORDER — BUPIVACAINE-EPINEPHRINE (PF) 0.25% -1:200000 IJ SOLN
INTRAMUSCULAR | Status: AC
  Filled 2022-02-06: qty 30

## 2022-02-06 SURGICAL SUPPLY — 33 items
APPLICATOR CHLORAPREP 10 TEAL (MISCELLANEOUS) ×1 IMPLANT
APPLIER CLIP 9.375 SM OPEN (CLIP)
CLIP APPLIE 9.375 SM OPEN (CLIP) IMPLANT
DERMABOND ADVANCED .7 DNX12 (GAUZE/BANDAGES/DRESSINGS) ×1 IMPLANT
DEVICE DUBIN SPECIMEN MAMMOGRA (MISCELLANEOUS) ×1 IMPLANT
DRAPE CHEST BREAST 77X106 FENE (MISCELLANEOUS) ×1 IMPLANT
ELECT CAUTERY BLADE 6.4 (BLADE) ×1 IMPLANT
ELECT REM PT RETURN 9FT ADLT (ELECTROSURGICAL) ×1
ELECTRODE REM PT RTRN 9FT ADLT (ELECTROSURGICAL) ×1 IMPLANT
GLOVE BIO SURGEON STRL SZ7 (GLOVE) ×1 IMPLANT
GOWN STRL REUS W/ TWL LRG LVL3 (GOWN DISPOSABLE) ×2 IMPLANT
GOWN STRL REUS W/TWL LRG LVL3 (GOWN DISPOSABLE) ×2
KIT MARKER MARGIN INK (KITS) IMPLANT
KIT TURNOVER KIT A (KITS) ×1 IMPLANT
MANIFOLD NEPTUNE II (INSTRUMENTS) ×1 IMPLANT
MARGIN MAP 10MM (MISCELLANEOUS) IMPLANT
MARKER MARGIN CORRECT CLIP (MARKER) IMPLANT
NEEDLE HYPO 22GX1.5 SAFETY (NEEDLE) ×1 IMPLANT
PACK BASIN MINOR ARMC (MISCELLANEOUS) ×1 IMPLANT
SET LOCALIZER 20 PROBE US (MISCELLANEOUS) ×1 IMPLANT
SPONGE T-LAP 18X18 ~~LOC~~+RFID (SPONGE) ×1 IMPLANT
SUT MNCRL 4-0 (SUTURE) ×1
SUT MNCRL 4-0 27XMFL (SUTURE) ×1
SUT SILK 2 0 SH (SUTURE) IMPLANT
SUT VIC AB 2-0 SH 27 (SUTURE) ×1
SUT VIC AB 2-0 SH 27XBRD (SUTURE) ×2 IMPLANT
SUT VIC AB 3-0 SH 27 (SUTURE) ×1
SUT VIC AB 3-0 SH 27X BRD (SUTURE) ×2 IMPLANT
SUTURE MNCRL 4-0 27XMF (SUTURE) ×2 IMPLANT
TRAP FLUID SMOKE EVACUATOR (MISCELLANEOUS) ×1 IMPLANT
TRAP NEPTUNE SPECIMEN COLLECT (MISCELLANEOUS) ×1 IMPLANT
WATER STERILE IRR 1000ML POUR (IV SOLUTION) ×1 IMPLANT
WATER STERILE IRR 500ML POUR (IV SOLUTION) ×1 IMPLANT

## 2022-02-06 NOTE — Anesthesia Postprocedure Evaluation (Signed)
Anesthesia Post Note  Patient: [name]  Procedure(s) Performed: BREAST LUMPECTOMY WITH RADIO FREQUENCY LOCALIZER (Right)  Patient location during evaluation: PACU Anesthesia Type: General Level of consciousness: awake and alert, oriented and patient cooperative Pain management: pain level controlled Vital Signs Assessment: post-procedure vital signs reviewed and stable Respiratory status: spontaneous breathing, nonlabored ventilation and respiratory function stable Cardiovascular status: blood pressure returned to baseline and stable Postop Assessment: adequate PO intake Anesthetic complications: no   No notable events documented.   Last Vitals:  Vitals:   02/06/22 1245 02/06/22 1305  BP: (!) 144/123 139/88  Pulse: 83 70  Resp: 11 15  Temp:  (!) 36.1 C  SpO2: 98% 100%    Last Pain:  Vitals:   02/06/22 1305  TempSrc: Temporal  PainSc: 0-No pain                 Darrin Nipper

## 2022-02-06 NOTE — Op Note (Addendum)
Pre-operative Diagnosis: Right Breast  Lesion c/w atypical lobular hyperplasia      Post-operative Diagnosis: Same   Surgeon: Caroleen Hamman,  MD FACS   Anesthesia: GETA   Procedure: Right Partial mastectomy, Magseed guided,sentinel node biopsy Complex closure with tissue rearrangements measuring 64cm2     Findings: Clip and lesion within xray specimen No evidence of injuries to prior implants   Estimated Blood Loss: Minimal         Drains: None         Specimens: partial mastectomy with labels        Complications: none         Condition: Stable     Procedure Details  The patient was seen again in the Holding Room. The benefits, complications, treatment options, and expected outcomes were discussed with the patient. The risks of bleeding, infection, recurrence of symptoms, failure to resolve symptoms, hematoma, seroma, open wound, cosmetic deformity, and the need for further surgery were discussed.  The patient was taken to Operating Room, identified  and the procedure verified.  A Time Out was held and the above information confirmed.   Prior to the induction of general anesthesia, antibiotic prophylaxis was administered. VTE prophylaxis was in place. Appropriate anesthesia was then administered and tolerated well. The chest was prepped with Chloraprep and draped in the sterile fashion. The patient was positioned in the supine position.  Using the hand-held probe [facility]) the area of high counts was identified in the right breast a crescent shape incision was made . Circumferential flaps created by dividing cooper's ligaments.Using the [facility] guidance we were able to locate the lesion, it was elevated with a 2-0 vicryl stitch and Dissection around the stitch was performed to perform a partial mastectomy with adequate margins. This was done with electrocautery and sharp dissection. Hemostasis was with electrocautery. We made sure we stayed above the pectoralis to avoid injuries to  the old implants.   Tissue advancement flaps were performed to decrease the volume deficit in the area of the resection.  Please note that the total void area was 8 x 8 cm equals 64 cm.  The chest wall flaps were created by incising the breast parenchyma from the pectoralis fascia in a circumferential method.  The breast parenchyma was then reapproximated in a deep to superficial fashion using interrupted 2-0 Vicryl sutures.  Please note that I placed 2 deep layers of 2-0 Vicryl's. Liposomal Marcaine injected for post op analgesia. Once assuring that hemostasis was adequate and checked multiple times the wound was closed with 4-0 subcuticular Monocryl sutures. Dermabond was placed   Patient was taken to the recovery room in stable condition.

## 2022-02-06 NOTE — Transfer of Care (Signed)
Immediate Anesthesia Transfer of Care Note  Patient: Denise Bolton  Procedure(s) Performed: BREAST LUMPECTOMY WITH RADIO FREQUENCY LOCALIZER (Right)  Patient Location: PACU  Anesthesia Type:General  Level of Consciousness: awake, drowsy, and patient cooperative  Airway & Oxygen Therapy: Patient Spontanous Breathing and Patient connected to nasal cannula oxygen  Post-op Assessment: Report given to RN and Post -op Vital signs reviewed and stable  Post vital signs: Reviewed and stable  Last Vitals:  Vitals Value Taken Time  BP 130/84 02/06/22 1218  Temp 36.2 C 02/06/22 1218  Pulse 91 02/06/22 1227  Resp 18 02/06/22 1227  SpO2 94 % 02/06/22 1227  Vitals shown include unvalidated device data.  Last Pain:  Vitals:   02/06/22 1218  TempSrc:   PainSc: 0-No pain      Patients Stated Pain Goal: 0 (08/81/10 3159)  Complications: No notable events documented.

## 2022-02-06 NOTE — Anesthesia Preprocedure Evaluation (Addendum)
Anesthesia Evaluation  Patient identified by MRN, date of birth, ID band Patient awake    Reviewed: Allergy & Precautions, NPO status , Patient's Chart, lab work & pertinent test results  History of Anesthesia Complications Negative for: history of anesthetic complications  Airway Mallampati: II   Neck ROM: Full    Dental no notable dental hx.    Pulmonary former smoker (quit 2007)   Pulmonary exam normal breath sounds clear to auscultation       Cardiovascular hypertension (controlled off medications), Normal cardiovascular exam Rhythm:Regular Rate:Normal     Neuro/Psych  PSYCHIATRIC DISORDERS  Depression    negative neurological ROS     GI/Hepatic ,GERD  ,,  Endo/Other  negative endocrine ROS    Renal/GU negative Renal ROS     Musculoskeletal  (+) Arthritis ,    Abdominal   Peds  Hematology  (+) Blood dyscrasia, anemia   Anesthesia Other Findings   Reproductive/Obstetrics                             Anesthesia Physical Anesthesia Plan  ASA: 2  Anesthesia Plan: General   Post-op Pain Management:    Induction: Intravenous  PONV Risk Score and Plan: 3 and Ondansetron, Dexamethasone, Treatment may vary due to age or medical condition, TIVA and Propofol infusion  Airway Management Planned: Oral ETT  Additional Equipment:   Intra-op Plan:   Post-operative Plan: Extubation in OR  Informed Consent: I have reviewed the patients History and Physical, chart, labs and discussed the procedure including the risks, benefits and alternatives for the proposed anesthesia with the patient or authorized representative who has indicated his/her understanding and acceptance.     Dental advisory given  Plan Discussed with: CRNA  Anesthesia Plan Comments: (Patient consented for risks of anesthesia including but not limited to:  - adverse reactions to medications - damage to eyes, teeth,  lips or other oral mucosa - nerve damage due to positioning  - sore throat or hoarseness - damage to heart, brain, nerves, lungs, other parts of body or loss of life  Informed patient about role of CRNA in peri- and intra-operative care.  Patient voiced understanding.)        Anesthesia Quick Evaluation

## 2022-02-06 NOTE — Discharge Instructions (Addendum)
Lumpectomy, Care After The following information offers guidance on how to care for yourself after your procedure. Your health care provider may also give you more specific instructions. If you have problems or questions, contact your health care provider. What can I expect after the procedure? After the procedure, it is common to have: Some pain or redness at the incision site. Breast swelling. Breast tenderness. Stiffness in your arm or shoulder. A change in the shape and feel of your breast. Scar tissue that feels hard to the touch in the area where the lump was removed. Follow these instructions at home: Medicines Take over-the-counter and prescription medicines only as told by your health care provider. If you were prescribed an antibiotic, take it as told by your health care provider. Do not stop taking the antibiotic even if you start to feel better. Ask your health care provider if the medicine prescribed to you: Requires you to avoid driving or using machinery. Can cause constipation. You may need to take these actions to prevent or treat constipation: Drink enough fluid to keep your urine pale yellow. Take over-the-counter or prescription medicines. Eat foods that are high in fiber, such as beans, whole grains, and fresh fruits and vegetables. Limit foods that are high in fat and processed sugars, such as fried or sweet foods. Incision care     Follow instructions from your health care provider about how to take care of your incision. Make sure you: Wash your hands with soap and water for at least 20 seconds before and after you change your bandage (dressing). If soap and water are not available, use hand sanitizer. Change your dressing as told by your health care provider. Leave stitches (sutures), skin glue, or adhesive strips in place. These skin closures may need to stay in place for 2 weeks or longer. If adhesive strip edges start to loosen and curl up, you may trim the  loose edges. Do not remove adhesive strips completely unless your health care provider tells you to do that. Check your incision area every day for signs of infection. Check for: More redness, swelling, or pain. Fluid or blood. Warmth. Pus or a bad smell. Keep your dressing clean and dry. If you were sent home with a surgical drain in place, follow instructions from your health care provider about emptying it. Bathing Do not take baths, swim, or use a hot tub until your health care provider approves. Ask your health care provider if you may take showers. You may only be allowed to take sponge baths. Activity Rest as told by your health care provider. Do not sit for a long time without moving. Get up to take short walks every 1-2 hours. This will improve blood flow and breathing. Ask for help if you feel weak or unsteady. Be careful to avoid any activities that could cause an injury to your arm on the side of your surgery. Do not lift anything that is heavier than 10 lb (4.5 kg), or the limit that you are told, until your health care provider says that it is safe. Avoid lifting with the arm that is on the side of your surgery. Do not carry heavy objects on your shoulder on the side of your surgery. Do exercises to keep your shoulder and arm from getting stiff and swollen. Talk with your health care provider about which exercises are safe for you. Return to your normal activities as told by your health care provider. Ask your health care provider what activities  are safe for you. General instructions Wear a supportive bra as told by your health care provider. Raise (elevate) your arm above the level of your heart while you are sitting or lying down. Do not wear tight jewelry on your arm, wrist, or fingers on the side of your surgery. Wear compression stockings as told by your health care provider. These stockings help to prevent blood clots and reduce swelling in your legs. If you had any lymph  nodes removed during your procedure, be sure to tell all of your health care providers. It is important to share this information before you have certain procedures, such as blood tests or blood pressure measurements. Keep all follow-up visits. You may need to be screened for extra fluid around the lymph nodes and swelling in the breast and arm (lymphedema). Contact a health care provider if: You develop a rash. You have a fever. Your pain worsens or pain medicine is not working. You have swelling, weakness, or numbness in your arm that does not improve after a few weeks. You have new swelling in your breast. You have any of these signs of infection: More redness, swelling, or pain in your incision area. Fluid or blood coming from your incision. Warmth coming from the incision area. Pus or a bad smell coming from your incision. Get help right away if: You have very bad pain in your breast or arm. You have swelling in your legs or arms. You have redness, warmth, or pain in your leg or arm. You have chest pain. You have difficulty breathing. These symptoms may be an emergency. Get help right away. Call 911. Do not wait to see if the symptoms will go away. Do not drive yourself to the hospital. Summary After the procedure, it is common to have breast tenderness, swelling in your breast, and stiffness in your arm and shoulder. Follow instructions from your health care provider about how to take care of your incision. Do not lift anything that is heavier than 10 lb (4.5 kg), or the limit that you are told, until your health care provider says that it is safe. Avoid lifting with the arm that is on the side of your surgery. If you had any lymph nodes removed during your procedure, be sure to tell all of your health care providers. This information is not intended to replace advice given to you by your health care provider. Make sure you discuss any questions you have with your health care  provider. Document Revised: 04/28/2021 Document Reviewed: 04/28/2021 Elsevier Patient Education  [place]   The drugs that you were given will stay in your system until tomorrow so for the next 24 hours you should not:  Drive an automobile Make any legal decisions Drink any alcoholic beverage   You may resume regular meals tomorrow.  Today it is better to start with liquids and gradually work up to solid foods.  You may eat anything you prefer, but it is better to start with liquids, then soup and crackers, and gradually work up to solid foods.   Please notify your doctor immediately if you have any unusual bleeding, trouble breathing, redness and pain at the surgery site, drainage, fever, or pain not relieved by medication.    Additional Instructions:  Please contact your physician with any problems or Same Day Surgery at [phone], Monday through Friday 6 am to 4 pm, or [place] at [facility] number at  [phone]. 

## 2022-02-06 NOTE — Interval H&P Note (Signed)
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  The various methods of treatment have been discussed with the patient and family. After consideration of risks, benefits and other options for treatment, the patient has consented to  Procedure(s): BREAST LUMPECTOMY WITH RADIO FREQUENCY LOCALIZER (Right) as a surgical intervention.  The patient's history has been reviewed, patient examined, no change in status, stable for surgery.  I have reviewed the patient's chart and labs.  Questions were answered to the patient's satisfaction.     [place]

## 2022-02-06 NOTE — Anesthesia Procedure Notes (Signed)

## 2022-02-07 ENCOUNTER — Encounter: Payer: Self-pay | Admitting: Surgery

## 2022-02-13 ENCOUNTER — Telehealth: Payer: Self-pay | Admitting: Surgery

## 2022-02-13 LAB — SURGICAL PATHOLOGY

## 2022-02-13 NOTE — Telephone Encounter (Signed)
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 She is doing very well and is very [employer] attorney.

## 2022-02-17 ENCOUNTER — Ambulatory Visit (INDEPENDENT_AMBULATORY_CARE_PROVIDER_SITE_OTHER): Payer: 59 | Admitting: Surgery

## 2022-02-17 ENCOUNTER — Encounter: Payer: Self-pay | Admitting: Surgery

## 2022-02-17 VITALS — BP 143/91 | HR 86 | Temp 98.5°F | Ht 65.0 in | Wt 183.2 lb

## 2022-02-17 DIAGNOSIS — Z09 Encounter for follow-up examination after completed treatment for conditions other than malignant neoplasm: Secondary | ICD-10-CM

## 2022-02-17 DIAGNOSIS — N6091 Unspecified benign mammary dysplasia of right breast: Secondary | ICD-10-CM

## 2022-02-17 NOTE — Patient Instructions (Signed)
If you have any concerns or questions, please feel free to call our office. Follow up as needed.   Lumpectomy, Care After The following information offers guidance on how to care for yourself after your procedure. Your health care provider may also give you more specific instructions. If you have problems or questions, contact your health care provider. What can I expect after the procedure? After the procedure, it is common to have: Some pain or redness at the incision site. Breast swelling. Breast tenderness. Stiffness in your arm or shoulder. A change in the shape and feel of your breast. Scar tissue that feels hard to the touch in the area where the lump was removed. Follow these instructions at home: Medicines Take over-the-counter and prescription medicines only as told by your health care provider. If you were prescribed an antibiotic, take it as told by your health care provider. Do not stop taking the antibiotic even if you start to feel better. Ask your health care provider if the medicine prescribed to you: Requires you to avoid driving or using machinery. Can cause constipation. You may need to take these actions to prevent or treat constipation: Drink enough fluid to keep your urine pale yellow. Take over-the-counter or prescription medicines. Eat foods that are high in fiber, such as beans, whole grains, and fresh fruits and vegetables. Limit foods that are high in fat and processed sugars, such as fried or sweet foods. Incision care     Follow instructions from your health care provider about how to take care of your incision. Make sure you: Wash your hands with soap and water for at least 20 seconds before and after you change your bandage (dressing). If soap and water are not available, use hand sanitizer. Change your dressing as told by your health care provider. Leave stitches (sutures), skin glue, or adhesive strips in place. These skin closures may need to stay in  place for 2 weeks or longer. If adhesive strip edges start to loosen and curl up, you may trim the loose edges. Do not remove adhesive strips completely unless your health care provider tells you to do that. Check your incision area every day for signs of infection. Check for: More redness, swelling, or pain. Fluid or blood. Warmth. Pus or a bad smell. Keep your dressing clean and dry. If you were sent home with a surgical drain in place, follow instructions from your health care provider about emptying it. Bathing Do not take baths, swim, or use a hot tub until your health care provider approves. Ask your health care provider if you may take showers. You may only be allowed to take sponge baths. Activity Rest as told by your health care provider. Do not sit for a long time without moving. Get up to take short walks every 1-2 hours. This will improve blood flow and breathing. Ask for help if you feel weak or unsteady. Be careful to avoid any activities that could cause an injury to your arm on the side of your surgery. Do not lift anything that is heavier than 10 lb (4.5 kg), or the limit that you are told, until your health care provider says that it is safe. Avoid lifting with the arm that is on the side of your surgery. Do not carry heavy objects on your shoulder on the side of your surgery. Do exercises to keep your shoulder and arm from getting stiff and swollen. Talk with your health care provider about which exercises are safe for  you. Return to your normal activities as told by your health care provider. Ask your health care provider what activities are safe for you. General instructions Wear a supportive bra as told by your health care provider. Raise (elevate) your arm above the level of your heart while you are sitting or lying down. Do not wear tight jewelry on your arm, wrist, or fingers on the side of your surgery. Wear compression stockings as told by your health care provider.  These stockings help to prevent blood clots and reduce swelling in your legs. If you had any lymph nodes removed during your procedure, be sure to tell all of your health care providers. It is important to share this information before you have certain procedures, such as blood tests or blood pressure measurements. Keep all follow-up visits. You may need to be screened for extra fluid around the lymph nodes and swelling in the breast and arm (lymphedema). Contact a health care provider if: You develop a rash. You have a fever. Your pain worsens or pain medicine is not working. You have swelling, weakness, or numbness in your arm that does not improve after a few weeks. You have new swelling in your breast. You have any of these signs of infection: More redness, swelling, or pain in your incision area. Fluid or blood coming from your incision. Warmth coming from the incision area. Pus or a bad smell coming from your incision. Get help right away if: You have very bad pain in your breast or arm. You have swelling in your legs or arms. You have redness, warmth, or pain in your leg or arm. You have chest pain. You have difficulty breathing. These symptoms may be an emergency. Get help right away. Call 911. Do not wait to see if the symptoms will go away. Do not drive yourself to the hospital. Summary After the procedure, it is common to have breast tenderness, swelling in your breast, and stiffness in your arm and shoulder. Follow instructions from your health care provider about how to take care of your incision. Do not lift anything that is heavier than 10 lb (4.5 kg), or the limit that you are told, until your health care provider says that it is safe. Avoid lifting with the arm that is on the side of your surgery. If you had any lymph nodes removed during your procedure, be sure to tell all of your health care providers. This information is not intended to replace advice given to you by  your health care provider. Make sure you discuss any questions you have with your health care provider. Document Revised: 04/28/2021 Document Reviewed: 04/28/2021 Elsevier Patient Education  [place].

## 2022-02-17 NOTE — Progress Notes (Signed)
Denise Bolton is a 41 year old following up after a breast lumpectomy 11 days ago.  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  Pathology discussed with her in detail.  She has no complaints.  She is doing very well.  Physical exam: No acute distress Right breast with incision healing well without infection.  No hematomas there is good symmetry.  A/P  41 year old female 11 days out from lumpectomy doing very well without complications.  Discussed with her in detail and recommend yearly mammograms.  She understands she is at higher risk for developing breast cancer since LCIS is a risk factor for it.

## 2022-03-17 NOTE — Progress Notes (Unsigned)
PCP:  Juline Patch, MD   No chief complaint on file.    HPI:      Ms. Denise Bolton is a 42 y.o. [id number] whose LMP was Patient's last menstrual period was 01/08/2015 (exact date)., presents today for her annual examination.  Her menses are absent due to lap hyst with lap BS 2016.   Sex activity: single partner, contraception - status post hysterectomy.  Last Pap: {AVWU:[id number]}  Results were: {norm/abn:16707::"no abnormalities"} /neg HPV DNA *** Hx of STDs: {STD hx:14358}  Last mammogram: {date:304500300}  Results were: {norm/abn:13465} Hx of PASH, lobular hyperplasia There is no FH of breast cancer. There is a FH of ovarian cancer in her mom. Pt is BRCA neg 2016****; IBIS=11%. The patient {does:18564} do self-breast exams.  Tobacco use: {tob:20664} Alcohol use: {Alcohol:11675} No drug use.  Exercise: {exercise:31265}  She {does:18564} get adequate calcium and Vitamin D in her diet.  Patient Active Problem List   Diagnosis Date Noted   Atypical lobular hyperplasia [facility]) of right breast 02/06/2022   Annual physical exam 04/08/2019   Recurrent major depressive disorder, in partial remission ([place]) 06/22/2017   S/P laparoscopic hysterectomy 01/11/2015   Iron deficiency anemia 08/22/2014   Palpitations 07/11/2013   Preeclampsia 07/11/2013    Past Surgical History:  Procedure Laterality Date   ABDOMINAL HYSTERECTOMY     ABDOMINOPLASTY     ABDOMINOPLASTY  2007   FATTY TISSUE REMOVED   AUGMENTATION MAMMAPLASTY Bilateral 2007   BREAST BIOPSY Right 01/07/2022   stereo bx, ribbon marker, path pending   BREAST BIOPSY Right 01/07/2022   MM RT BREAST BX W LOC DEV 1ST LESION IMAGE BX SPEC STEREO GUIDE 01/07/2022 [facility]   BREAST BIOPSY Right 02/05/2022   MM RT RADIO FREQUENCY TAG LOC MAMMO GUIDE 02/05/2022 [facility]   BREAST LUMPECTOMY WITH RADIO FREQUENCY LOCALIZER Right 02/06/2022   Procedure: BREAST LUMPECTOMY WITH RADIO FREQUENCY LOCALIZER;  Surgeon:  Jules Husbands, MD;  Location: [facility];  Service: General;  Laterality: Right;   BREAST SURGERY     augmentation   CESAREAN SECTION     x 2   CYSTOSCOPY  01/11/2015   Procedure: CYSTOSCOPY;  Surgeon: Malachy Mood, MD;  Location: [facility];  Service: Gynecology;;   LAPAROSCOPIC BILATERAL SALPINGECTOMY Bilateral 01/11/2015   Procedure: LAPAROSCOPIC BILATERAL SALPINGECTOMY;  Surgeon: Malachy Mood, MD;  Location: [facility];  Service: Gynecology;  Laterality: Bilateral;   LAPAROSCOPIC HYSTERECTOMY N/A 01/11/2015   Procedure: HYSTERECTOMY TOTAL LAPAROSCOPIC;  Surgeon: Malachy Mood, MD;  Location: [facility];  Service: Gynecology;  Laterality: N/A;    Family History  Problem Relation Age of Onset   Ovarian cancer Mother 83       DECEASED 70   Cancer Mother 69       LUNG   Melanoma Father 43   Breast cancer Neg Hx     Social History   Socioeconomic History   Marital status: Married    Spouse name: Not on file   Number of children: Not on file   Years of education: Not on file   Highest education level: Not on file  Occupational History   Not on file  Tobacco Use   Smoking status: Former    Types: Cigarettes    Quit date: 01/02/2006    Years since quitting: 16.2   Smokeless tobacco: Never  Vaping Use   Vaping Use: Never used  Substance and Sexual Activity   Alcohol use: Yes    Comment: 2 glasses wine every  night   Drug use: No   Sexual activity: Yes    Birth control/protection: Surgical    Comment: Hysterectomy  Other Topics Concern   Not on file  Social History Narrative   Not on file   Social Determinants of Health   Financial Resource Strain: Not on file  Food Insecurity: Not on file  Transportation Needs: Not on file  Physical Activity: Insufficiently Active (07/10/2017)   Exercise Vital Sign    Days of Exercise per Week: 3 days    Minutes of Exercise per Session: 30 min  Stress: No Stress Concern Present (07/10/2017)   [place]    Feeling of Stress : Not at all  Social Connections: Not on file  Intimate Partner Violence: Not on file     Current Outpatient Medications:    b complex vitamins capsule, Take 1 capsule by mouth daily., Disp: , Rfl:    CALCIUM-MAGNESIUM-ZINC PO, Take 1 tablet by mouth daily at 6 (six) AM., Disp: , Rfl:    Omega-3 Fatty Acids (FISH OIL PO), Take 1 tablet by mouth daily at 6 (six) AM., Disp: , Rfl:      ROS:  Review of Systems BREAST: No symptoms   Objective: LMP 01/08/2015 (Exact Date)    OBGyn Exam  Results: No results found for this or any previous visit (from the past 24 hour(s)).  Assessment/Plan: No diagnosis found.  No orders of the defined types were placed in this encounter.            GYN counsel {counseling: 16159}     F/U  No follow-ups on file.  Deontray Hunnicutt B. Cashmere Dingley, PA-C 03/17/2022 10:12 PM

## 2022-03-18 ENCOUNTER — Ambulatory Visit (INDEPENDENT_AMBULATORY_CARE_PROVIDER_SITE_OTHER): Payer: Commercial Managed Care - PPO | Admitting: Obstetrics and Gynecology

## 2022-03-18 ENCOUNTER — Other Ambulatory Visit (HOSPITAL_COMMUNITY)
Admission: RE | Admit: 2022-03-18 | Discharge: 2022-03-18 | Disposition: A | Discharge status: Home / Self Care | Payer: Commercial Managed Care - PPO | Source: Ambulatory Visit | Attending: Obstetrics and Gynecology | Admitting: Obstetrics and Gynecology

## 2022-03-18 ENCOUNTER — Encounter: Payer: Self-pay | Admitting: Obstetrics and Gynecology

## 2022-03-18 VITALS — BP 120/80 | Ht 65.0 in | Wt 185.0 lb

## 2022-03-18 DIAGNOSIS — Z1231 Encounter for screening mammogram for malignant neoplasm of breast: Secondary | ICD-10-CM

## 2022-03-18 DIAGNOSIS — Z124 Encounter for screening for malignant neoplasm of cervix: Secondary | ICD-10-CM

## 2022-03-18 DIAGNOSIS — Z113 Encounter for screening for infections with a predominantly sexual mode of transmission: Secondary | ICD-10-CM | POA: Insufficient documentation

## 2022-03-18 DIAGNOSIS — Z8041 Family history of malignant neoplasm of ovary: Secondary | ICD-10-CM | POA: Diagnosis not present

## 2022-03-18 DIAGNOSIS — Z01419 Encounter for gynecological examination (general) (routine) without abnormal findings: Secondary | ICD-10-CM

## 2022-03-18 DIAGNOSIS — Z1151 Encounter for screening for human papillomavirus (HPV): Secondary | ICD-10-CM

## 2022-03-18 NOTE — Patient Instructions (Signed)
I value your feedback and you entrusting us with your care. If you get a [place] patient survey, I would appreciate you taking the time to let us know about your experience today. Thank you! ? ? ?

## 2022-03-20 LAB — CYTOLOGY - PAP
Adequacy: ABSENT
Comment: NEGATIVE
Diagnosis: NEGATIVE
High risk HPV: NEGATIVE

## 2022-06-11 IMAGING — MG DIGITAL SCREENING BREAST BILAT IMPLANT W/ TOMO W/ CAD
8 of 14 series · 8 of 34 positions shown · non-contrast
Comparison: Previous exams.

CLINICAL DATA: Screening.

EXAM:
DIGITAL SCREENING BILATERAL MAMMOGRAM WITH IMPLANTS, CAD AND
TOMOSYNTHESIS
TECHNIQUE: Bilateral screening digital craniocaudal and mediolateral oblique
mammograms were obtained. Bilateral screening digital breast
tomosynthesis was performed. The images were evaluated with
computer-aided detection. Standard and/or implant displaced views
were performed.

[R MLO]
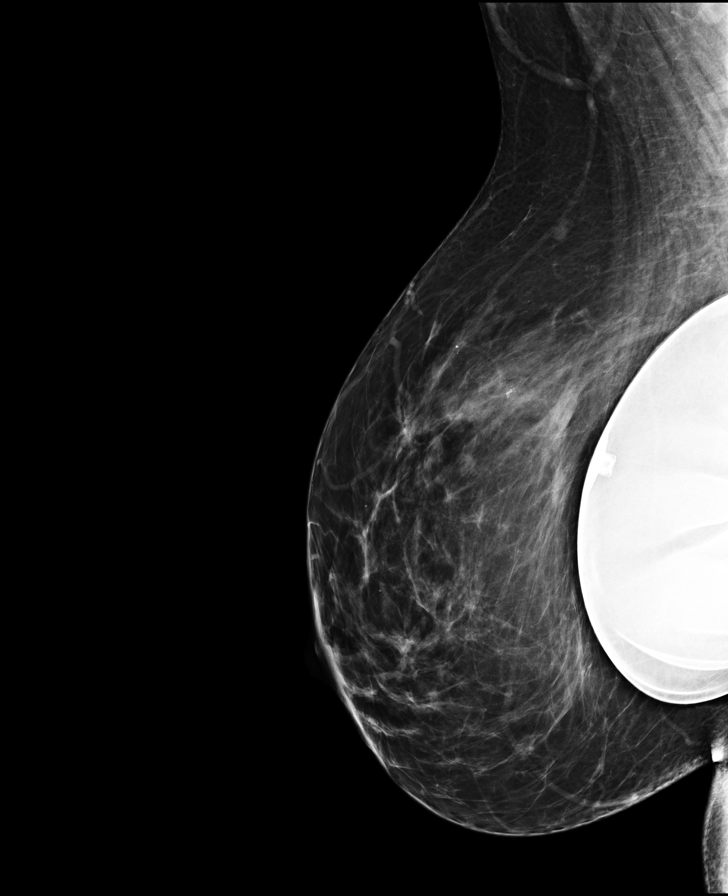

[R CC]
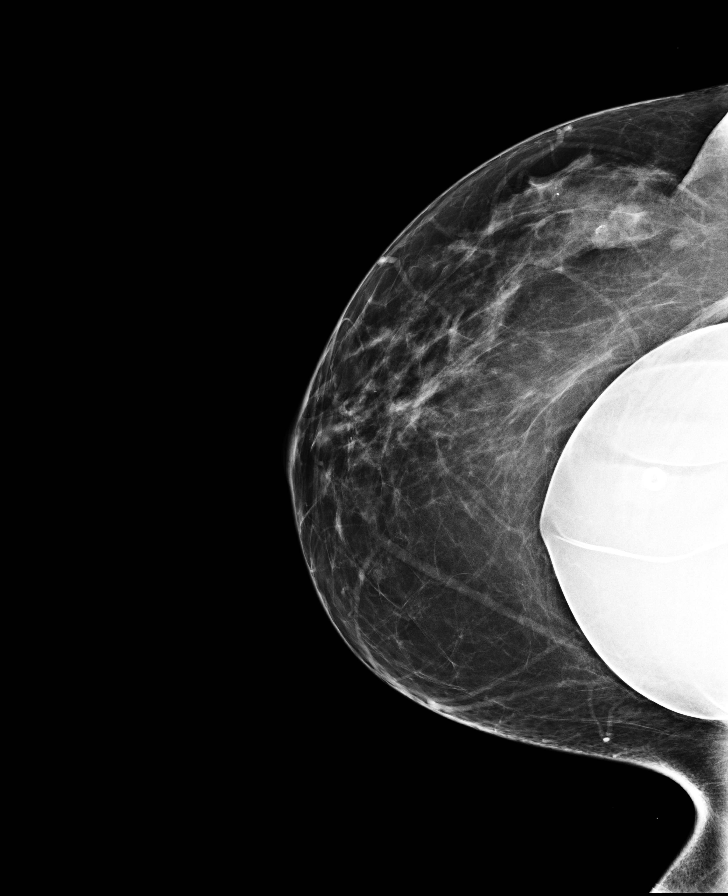

[L CC]
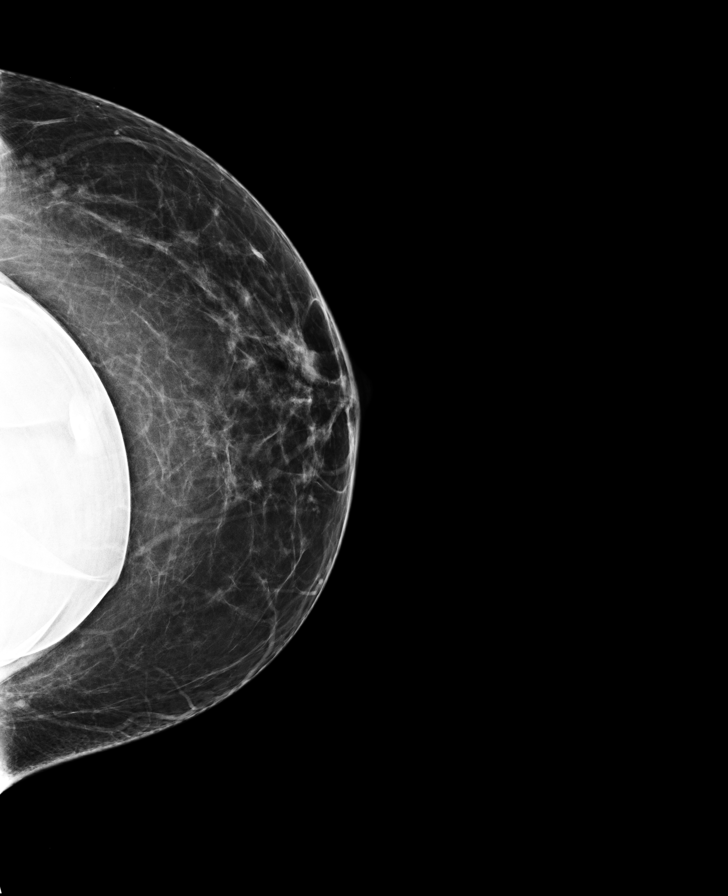

[L MLO]
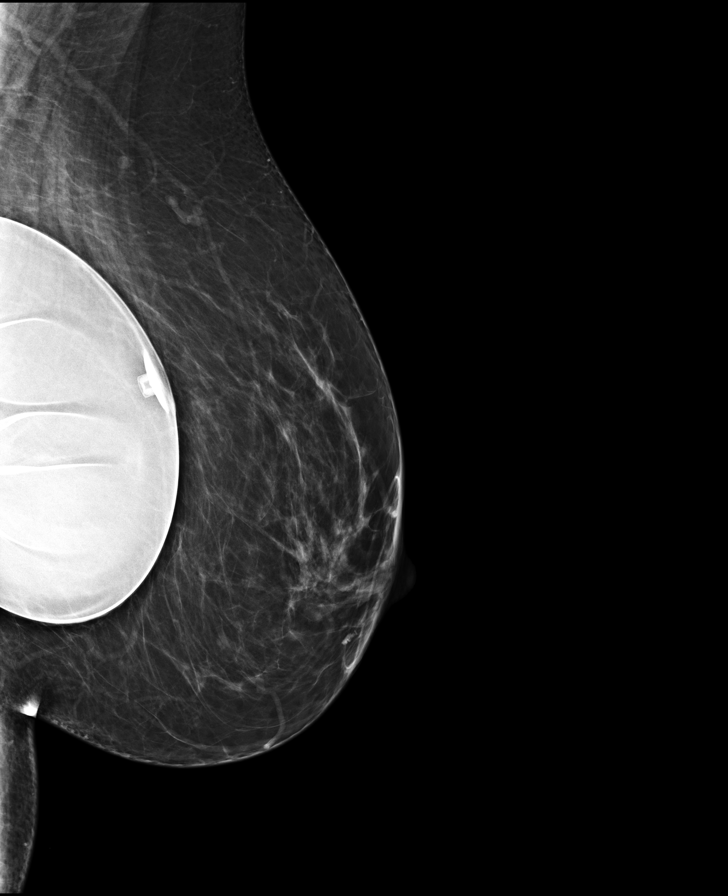

[L CC synth-2D]
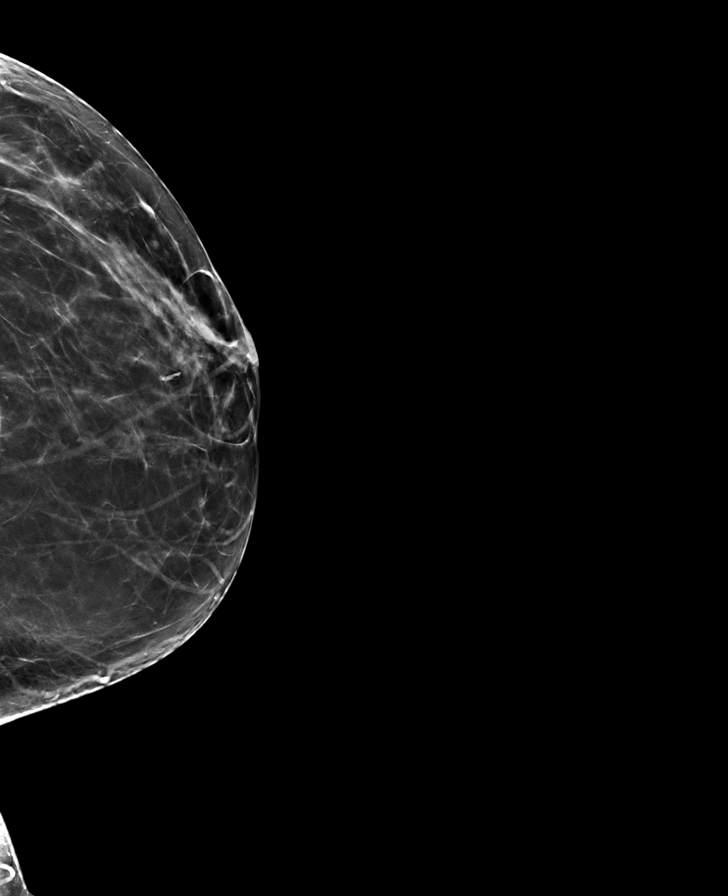

[R CC synth-2D (1 of 2)]
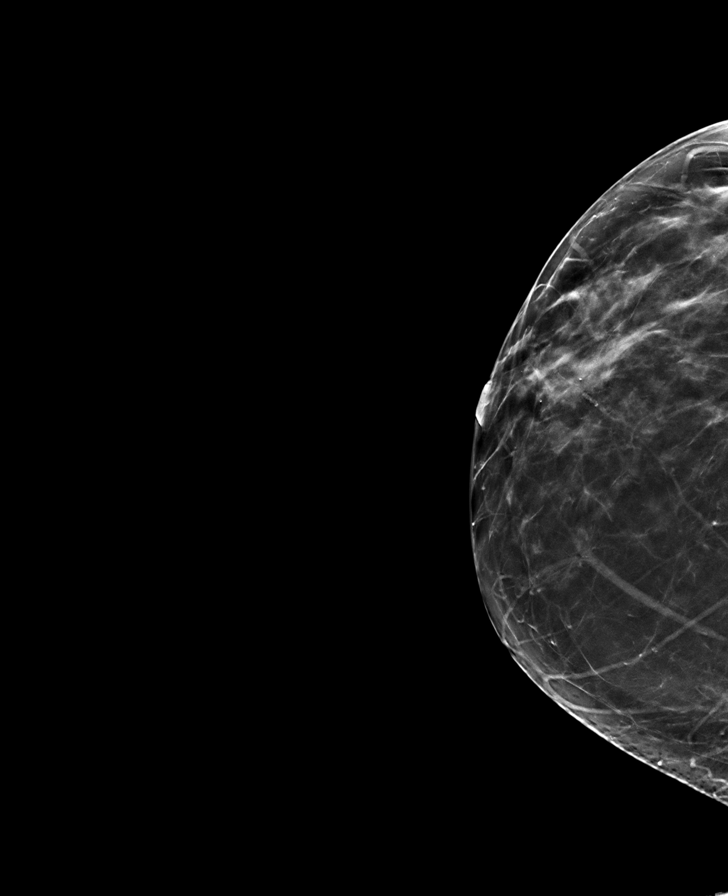

[L MLO synth-2D]
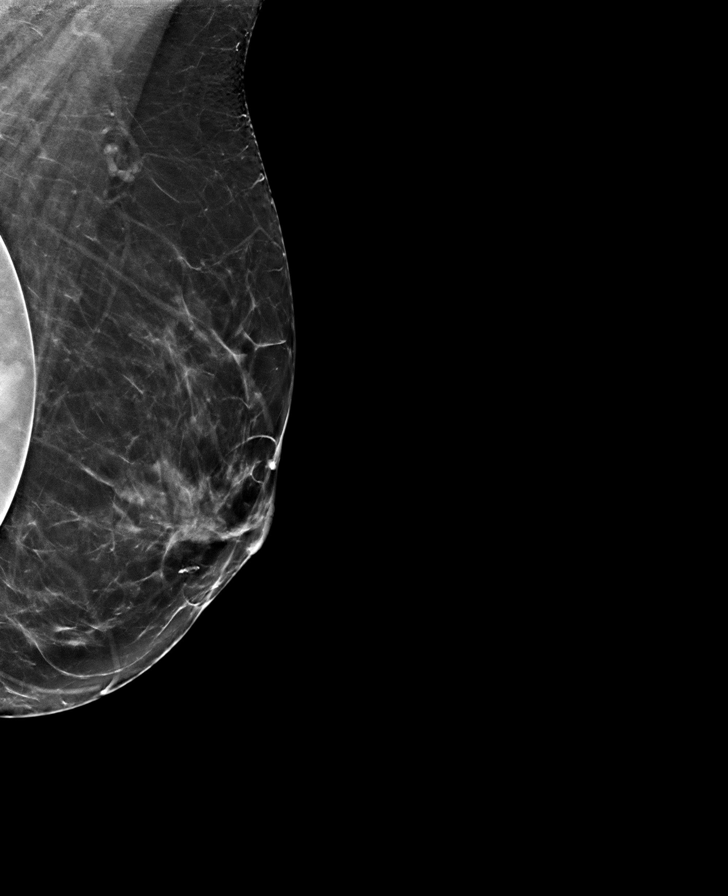

[R CC synth-2D (2 of 2)]
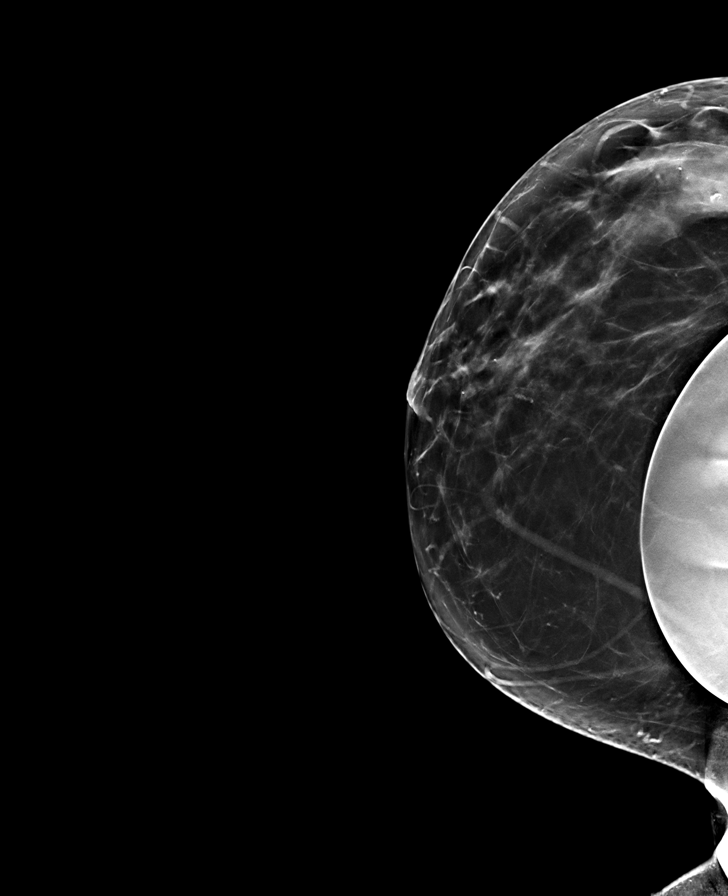

[8 of 34 positions shown; findings below may reference images not displayed]

ACR Breast Density Category c: The breast tissue is heterogeneously
dense, which may obscure small masses.
FINDINGS: The patient has bilateral retropectoral saline implants. There are
no findings suspicious for malignancy.
IMPRESSION: No mammographic evidence of malignancy. A result letter of this
screening mammogram will be mailed directly to the patient.

RECOMMENDATION:
Screening mammogram in one year. (Code:HR-A-08H)

BI-RADS CATEGORY  1:  Negative.

## 2022-06-30 DIAGNOSIS — M1611 Unilateral primary osteoarthritis, right hip: Secondary | ICD-10-CM | POA: Diagnosis not present

## 2022-06-30 DIAGNOSIS — G8929 Other chronic pain: Secondary | ICD-10-CM | POA: Diagnosis not present

## 2022-10-23 ENCOUNTER — Encounter: Payer: 59 | Admitting: Family Medicine

## 2022-11-13 DIAGNOSIS — D225 Melanocytic nevi of trunk: Secondary | ICD-10-CM | POA: Diagnosis not present

## 2022-11-13 DIAGNOSIS — L718 Other rosacea: Secondary | ICD-10-CM | POA: Diagnosis not present

## 2022-11-13 DIAGNOSIS — Z09 Encounter for follow-up examination after completed treatment for conditions other than malignant neoplasm: Secondary | ICD-10-CM | POA: Diagnosis not present

## 2022-11-13 DIAGNOSIS — L570 Actinic keratosis: Secondary | ICD-10-CM | POA: Diagnosis not present

## 2022-11-13 DIAGNOSIS — Z7189 Other specified counseling: Secondary | ICD-10-CM | POA: Diagnosis not present

## 2022-11-13 DIAGNOSIS — Z872 Personal history of diseases of the skin and subcutaneous tissue: Secondary | ICD-10-CM | POA: Diagnosis not present

## 2022-11-13 DIAGNOSIS — D2271 Melanocytic nevi of right lower limb, including hip: Secondary | ICD-10-CM | POA: Diagnosis not present

## 2022-11-13 DIAGNOSIS — X32XXXA Exposure to sunlight, initial encounter: Secondary | ICD-10-CM | POA: Diagnosis not present

## 2022-11-13 DIAGNOSIS — L218 Other seborrheic dermatitis: Secondary | ICD-10-CM | POA: Diagnosis not present

## 2023-04-07 ENCOUNTER — Encounter: Payer: Commercial Managed Care - PPO | Admitting: Family Medicine

## 2023-06-09 ENCOUNTER — Ambulatory Visit (INDEPENDENT_AMBULATORY_CARE_PROVIDER_SITE_OTHER): Admitting: Family Medicine

## 2023-06-09 VITALS — BP 138/90 | HR 74 | Ht 65.0 in | Wt 193.8 lb

## 2023-06-09 DIAGNOSIS — M25551 Pain in right hip: Secondary | ICD-10-CM

## 2023-06-09 DIAGNOSIS — Z0000 Encounter for general adult medical examination without abnormal findings: Secondary | ICD-10-CM

## 2023-06-09 DIAGNOSIS — Z862 Personal history of diseases of the blood and blood-forming organs and certain disorders involving the immune mechanism: Secondary | ICD-10-CM

## 2023-06-10 ENCOUNTER — Encounter: Payer: Self-pay | Admitting: Family Medicine

## 2023-06-10 LAB — COMPREHENSIVE METABOLIC PANEL WITH GFR
ALT: 23 IU/L (ref 0–32)
AST: 17 IU/L (ref 0–40)
Albumin: 4.2 g/dL (ref 3.9–4.9)
Alkaline Phosphatase: 47 IU/L (ref 44–121)
BUN/Creatinine Ratio: 18 (ref 9–23)
BUN: 12 mg/dL (ref 6–24)
Bilirubin Total: 0.7 mg/dL (ref 0.0–1.2)
CO2: 23 mmol/L (ref 20–29)
Calcium: 9.1 mg/dL (ref 8.7–10.2)
Chloride: 104 mmol/L (ref 96–106)
Creatinine, Ser: 0.67 mg/dL (ref 0.57–1.00)
Globulin, Total: 1.8 g/dL (ref 1.5–4.5)
Glucose: 96 mg/dL (ref 70–99)
Potassium: 5.1 mmol/L (ref 3.5–5.2)
Sodium: 139 mmol/L (ref 134–144)
Total Protein: 6 g/dL (ref 6.0–8.5)
eGFR: 110 mL/min/{1.73_m2} (ref >59)

## 2023-06-10 LAB — CBC WITH DIFFERENTIAL/PLATELET
Basophils Absolute: 0.1 10*3/uL (ref 0.0–0.2)
Basos: 1 %
EOS (ABSOLUTE): 0.1 10*3/uL (ref 0.0–0.4)
Eos: 3 %
Hematocrit: 39 % (ref 34.0–46.6)
Hemoglobin: 12.9 g/dL (ref 11.1–15.9)
Immature Grans (Abs): 0 10*3/uL (ref 0.0–0.1)
Immature Granulocytes: 0 %
Lymphocytes Absolute: 2 10*3/uL (ref 0.7–3.1)
Lymphs: 37 %
MCH: 32.3 pg (ref 26.6–33.0)
MCHC: 33.1 g/dL (ref 31.5–35.7)
MCV: 98 fL — ABNORMAL HIGH (ref 79–97)
Monocytes Absolute: 0.3 10*3/uL (ref 0.1–0.9)
Monocytes: 6 %
Neutrophils Absolute: 2.9 10*3/uL (ref 1.4–7.0)
Neutrophils: 53 %
Platelets: 270 10*3/uL (ref 150–450)
RBC: 3.99 x10E6/uL (ref 3.77–5.28)
RDW: 11.8 % (ref 11.7–15.4)
WBC: 5.4 10*3/uL (ref 3.4–10.8)

## 2023-06-10 LAB — LIPID PANEL WITH LDL/HDL RATIO
Cholesterol, Total: 209 mg/dL — ABNORMAL HIGH (ref 100–199)
HDL: 67 mg/dL (ref >39)
LDL Chol Calc (NIH): 130 mg/dL — ABNORMAL HIGH (ref 0–99)
LDL/HDL Ratio: 1.9 ratio (ref 0.0–3.2)
Triglycerides: 68 mg/dL (ref 0–149)
VLDL Cholesterol Cal: 12 mg/dL (ref 5–40)

## 2023-06-10 NOTE — Progress Notes (Addendum)
 Date:  06/09/2023   Name:  Denise Bolton   DOB:  January 15, 1981   MRN:  [id number]   Chief Complaint: Annual Exam (Patient presents today for her annual exam. She is doing well today and has no concerns. )  HPI  Lab Results  Component Value Date   NA 139 06/09/2023   K 5.1 06/09/2023   CO2 23 06/09/2023   GLUCOSE 96 06/09/2023   BUN 12 06/09/2023   CREATININE 0.67 06/09/2023   CALCIUM 9.1 06/09/2023   EGFR 110 06/09/2023   GFRNONAA >60 10/21/2021   Lab Results  Component Value Date   CHOL 209 (H) 06/09/2023   HDL 67 06/09/2023   LDLCALC 130 (H) 06/09/2023   TRIG 68 06/09/2023   CHOLHDL 2.8 10/21/2021   No results found for: "TSH" No results found for: "HGBA1C" Lab Results  Component Value Date   WBC 5.4 06/09/2023   HGB 12.9 06/09/2023   HCT 39.0 06/09/2023   MCV 98 (H) 06/09/2023   PLT 270 06/09/2023   Lab Results  Component Value Date   ALT 23 06/09/2023   AST 17 06/09/2023   ALKPHOS 47 06/09/2023   BILITOT 0.7 06/09/2023   No results found for: "25OHVITD2", "25OHVITD3", "VD25OH"   Review of Systems  Patient Active Problem List   Diagnosis Date Noted   Family history of ovarian cancer 03/18/2022   Atypical lobular hyperplasia [facility]) of right breast 02/06/2022   Annual physical exam 04/08/2019   Recurrent major depressive disorder, in partial remission (HCC) 06/22/2017   S/P laparoscopic hysterectomy 01/11/2015   Iron deficiency anemia 08/22/2014   Palpitations 07/11/2013   Preeclampsia 07/11/2013    No Known Allergies  Past Surgical History:  Procedure Laterality Date   ABDOMINAL HYSTERECTOMY     ABDOMINOPLASTY     ABDOMINOPLASTY  2007   FATTY TISSUE REMOVED   AUGMENTATION MAMMAPLASTY Bilateral 2007   BREAST BIOPSY Right 01/07/2022   stereo bx, ribbon marker, path pending   BREAST BIOPSY Right 01/07/2022   MM RT BREAST BX W LOC DEV 1ST LESION IMAGE BX SPEC STEREO GUIDE 01/07/2022 [facility]   BREAST BIOPSY Right 02/05/2022   MM RT RADIO  FREQUENCY TAG LOC MAMMO GUIDE 02/05/2022 [facility]   BREAST LUMPECTOMY WITH RADIO FREQUENCY LOCALIZER Right 02/06/2022   Procedure: BREAST LUMPECTOMY WITH RADIO FREQUENCY LOCALIZER;  Surgeon: Leafy Ro, MD;  Location: [facility];  Service: General;  Laterality: Right;   BREAST SURGERY     augmentation   CESAREAN SECTION     x 2   CYSTOSCOPY  01/11/2015   Procedure: CYSTOSCOPY;  Surgeon: Vena Austria, MD;  Location: [facility];  Service: Gynecology;;   LAPAROSCOPIC BILATERAL SALPINGECTOMY Bilateral 01/11/2015   Procedure: LAPAROSCOPIC BILATERAL SALPINGECTOMY;  Surgeon: Vena Austria, MD;  Location: [facility];  Service: Gynecology;  Laterality: Bilateral;   LAPAROSCOPIC HYSTERECTOMY N/A 01/11/2015   Procedure: HYSTERECTOMY TOTAL LAPAROSCOPIC;  Surgeon: Vena Austria, MD;  Location: [facility];  Service: Gynecology;  Laterality: N/A;    Social History   Tobacco Use   Smoking status: Former    Current packs/day: 0.00    Types: Cigarettes    Quit date: 01/02/2006    Years since quitting: 17.4   Smokeless tobacco: Never  Vaping Use   Vaping status: Never Used  Substance Use Topics   Alcohol use: Yes    Comment: 2 glasses wine every night   Drug use: No     Medication list has been reviewed and updated.  Current Meds  Medication Sig   b complex vitamins capsule Take 1 capsule by mouth daily.   CALCIUM-MAGNESIUM-ZINC PO Take 1 tablet by mouth daily at 6 (six) AM.       06/09/2023    9:27 AM 12/09/2021   10:17 AM 10/21/2021    9:57 AM 10/04/2021    8:21 AM  GAD 7 : Generalized Anxiety Score  Nervous, Anxious, on Edge 0 0 0 0  Control/stop worrying 0 0 0 0  Worry too much - different things 0 0 0 0  Trouble relaxing 0 0 0 0  Restless 0 0 0 0  Easily annoyed or irritable 0 0 0 0  Afraid - awful might happen 0 0 0 0  Total GAD 7 Score 0 0 0 0  Anxiety Difficulty Not difficult at all Not difficult at all Not difficult at all Not difficult at all       06/09/2023     9:27 AM 12/09/2021   10:17 AM 10/21/2021    9:56 AM  Depression screen PHQ 2/9  Decreased Interest 0 0 0  Down, Depressed, Hopeless 0 0 0  PHQ - 2 Score 0 0 0  Altered sleeping 0 0 0  Tired, decreased energy 0 0 0  Change in appetite 0 0 0  Feeling bad or failure about yourself  0 0 0  Trouble concentrating 0 0 0  Moving slowly or fidgety/restless 0 0 0  Suicidal thoughts  0 0  PHQ-9 Score 0 0 0  Difficult doing work/chores Not difficult at all Not difficult at all Not difficult at all    BP Readings from Last 3 Encounters:  06/09/23 (!) 138/90  03/18/22 120/80  02/17/22 (!) 143/91    Physical Exam Vitals and nursing note reviewed.  Constitutional:      General: She is not in acute distress.    Appearance: She is well-groomed. She is not diaphoretic.  HENT:     Head: Normocephalic and atraumatic.     Jaw: There is normal jaw occlusion.     Right Ear: Hearing, tympanic membrane and external ear normal.     Left Ear: Hearing, tympanic membrane and external ear normal.     Nose: Nose normal. No congestion or rhinorrhea.     Mouth/Throat:     Lips: Pink.     Mouth: Mucous membranes are moist.     Dentition: Normal dentition.     Pharynx: Oropharynx is clear. Uvula midline.  Eyes:     General: Lids are normal. Vision grossly intact. Gaze aligned appropriately.        Right eye: No discharge.        Left eye: No discharge.     Conjunctiva/sclera: Conjunctivae normal.     Pupils: Pupils are equal, round, and reactive to light.  Neck:     Thyroid: No thyroid mass, thyromegaly or thyroid tenderness.     Vascular: Normal carotid pulses. No carotid bruit, hepatojugular reflux or JVD.     Trachea: Trachea and phonation normal.  Cardiovascular:     Rate and Rhythm: Normal rate and regular rhythm.     Chest Wall: PMI is not displaced.     Pulses: No decreased pulses.     Heart sounds: Normal heart sounds and S2 normal. No murmur heard.    No systolic murmur is present.     No  diastolic murmur is present.     No friction rub. No gallop. No S3 or S4  sounds.  Pulmonary:     Effort: Pulmonary effort is normal.     Breath sounds: Normal breath sounds. No decreased breath sounds, wheezing, rhonchi or rales.  Chest:  Breasts:    Right: Normal.     Left: Normal.  Abdominal:     General: Bowel sounds are normal.     Palpations: Abdomen is soft. There is no hepatomegaly, splenomegaly or mass.     Tenderness: There is no abdominal tenderness. There is no guarding.  Musculoskeletal:        General: Normal range of motion.     Cervical back: Normal range of motion and neck supple.     Right lower leg: No edema.     Left lower leg: No edema.  Lymphadenopathy:     Cervical: No cervical adenopathy.     Right cervical: No superficial, deep or posterior cervical adenopathy.    Left cervical: No superficial, deep or posterior cervical adenopathy.     Upper Body:     Right upper body: No supraclavicular adenopathy.     Left upper body: No supraclavicular adenopathy.  Skin:    General: Skin is warm and dry.     Capillary Refill: Capillary refill takes less than 2 seconds.  Neurological:     Mental Status: She is alert.     Cranial Nerves: Cranial nerves 2-12 are intact.     Sensory: Sensation is intact.     Motor: Motor function is intact.     Deep Tendon Reflexes: Reflexes are normal and symmetric.     Wt Readings from Last 3 Encounters:  06/09/23 193 lb 12.8 oz (87.9 kg)  03/18/22 185 lb (83.9 kg)  02/17/22 183 lb 3.2 oz (83.1 kg)    BP (!) 138/90   Pulse 74   Ht 5\' 5"  (1.651 m)   Wt 193 lb 12.8 oz (87.9 kg)   LMP 01/08/2015 (Exact Date)   SpO2 97%   BMI 32.25 kg/m   Assessment and Plan: AMRAN MALTER is a 43 y.o. female who presents today for her Complete Annual Exam. She feels well. She reports exercising . She reports she is sleeping fairly well.  Immunizations are reviewed and recommendations provided.   Age appropriate screening tests are  discussed. Counseling given for risk factor reduction interventions.  1. Annual physical exam (Primary) No subjective/objective concerns noted during HPI, review of past medical history/review of medications/review of within the last 6 months labs, review of systems and physical exam.  Will obtain baseline annual physical exams consisting of lipid panel CMP and CBC.   - Comprehensive metabolic panel with GFR - CBC with Differential/Platelet  2. Pain of right hip Return in pain of right hip status post injection of right hip.  Patient continues to have pain at this time and would like to have it reevaluated and possibly considered for repeat injection.  Will refer to orthopedic surgery at [facility] with [facility] Monday. - Ambulatory referral to Orthopedic Surgery  3. History of anemia Patient with history of anemia we will check CBC and CMP. - Comprehensive metabolic panel with GFR    Elizabeth Sauer, MD

## 2023-06-26 DIAGNOSIS — M1611 Unilateral primary osteoarthritis, right hip: Secondary | ICD-10-CM | POA: Diagnosis not present

## 2023-06-26 DIAGNOSIS — M25551 Pain in right hip: Secondary | ICD-10-CM | POA: Diagnosis not present

## 2023-06-26 DIAGNOSIS — G8929 Other chronic pain: Secondary | ICD-10-CM | POA: Diagnosis not present

## 2023-06-29 ENCOUNTER — Ambulatory Visit: Admitting: Student

## 2023-08-11 ENCOUNTER — Ambulatory Visit: Attending: Orthopedic Surgery | Admitting: Physical Therapy

## 2023-08-11 DIAGNOSIS — R262 Difficulty in walking, not elsewhere classified: Secondary | ICD-10-CM | POA: Diagnosis not present

## 2023-08-11 DIAGNOSIS — M25551 Pain in right hip: Secondary | ICD-10-CM | POA: Diagnosis not present

## 2023-08-11 DIAGNOSIS — M6281 Muscle weakness (generalized): Secondary | ICD-10-CM | POA: Diagnosis not present

## 2023-08-11 NOTE — Therapy (Unsigned)
 OUTPATIENT PHYSICAL THERAPY HIP/LOWER QUARTER EVALUATION   Patient Name: Denise Bolton MRN: [id number] DOB:1980/11/15, 43 y.o., female Today's Date: 08/13/2023  END OF SESSION:  PT End of Session - 08/13/23 1025     Visit Number 1    Number of Visits 13    Date for PT Re-Evaluation 09/24/23    PT Start Time 1606    PT Stop Time 1646    PT Time Calculation (min) 40 min    Activity Tolerance Patient limited by pain;Patient tolerated treatment well    Behavior During Therapy [facility] for tasks assessed/performed          Past Medical History:  Diagnosis Date   Anemia    prior to hysterectomy   BRCA negative 10/2014   MyRisk neg; IBIS=11.1%   Depression    Family history of ovarian cancer    MOTHER   Genetic testing of female 10/2014   IBIS=11.1%   GERD (gastroesophageal reflux disease)    Headache    migraines   Hypertension    h/o-came off meds per md instruction and changed lifestyle/diet and bp now controlled with no meds   Menorrhagia    Past Surgical History:  Procedure Laterality Date   ABDOMINAL HYSTERECTOMY     ABDOMINOPLASTY     ABDOMINOPLASTY  2007   FATTY TISSUE REMOVED   AUGMENTATION MAMMAPLASTY Bilateral 2007   BREAST BIOPSY Right 01/07/2022   stereo bx, ribbon marker, path pending   BREAST BIOPSY Right 01/07/2022   MM RT BREAST BX W LOC DEV 1ST LESION IMAGE BX SPEC STEREO GUIDE 01/07/2022 [facility]   BREAST BIOPSY Right 02/05/2022   MM RT RADIO FREQUENCY TAG LOC MAMMO GUIDE 02/05/2022 [facility]   BREAST LUMPECTOMY WITH RADIO FREQUENCY LOCALIZER Right 02/06/2022   Procedure: BREAST LUMPECTOMY WITH RADIO FREQUENCY LOCALIZER;  Surgeon: Alben Alma, MD;  Location: [facility];  Service: General;  Laterality: Right;   BREAST SURGERY     augmentation   CESAREAN SECTION     x 2   CYSTOSCOPY  01/11/2015   Procedure: CYSTOSCOPY;  Surgeon: Darl Edu, MD;  Location: [facility];  Service: Gynecology;;   LAPAROSCOPIC BILATERAL SALPINGECTOMY  Bilateral 01/11/2015   Procedure: LAPAROSCOPIC BILATERAL SALPINGECTOMY;  Surgeon: Darl Edu, MD;  Location: [facility];  Service: Gynecology;  Laterality: Bilateral;   LAPAROSCOPIC HYSTERECTOMY N/A 01/11/2015   Procedure: HYSTERECTOMY TOTAL LAPAROSCOPIC;  Surgeon: Darl Edu, MD;  Location: [facility];  Service: Gynecology;  Laterality: N/A;   Patient Active Problem List   Diagnosis Date Noted   Family history of ovarian cancer 03/18/2022   Atypical lobular hyperplasia [facility]) of right breast 02/06/2022   Annual physical exam 04/08/2019   Recurrent major depressive disorder, in partial remission (HCC) 06/22/2017   S/P laparoscopic hysterectomy 01/11/2015   Iron deficiency anemia 08/22/2014   Palpitations 07/11/2013   Preeclampsia 07/11/2013    PCP: Clarise Crooks, MD (Inactive)  REFERRING PROVIDER: Bert Britain, PA-C  REFERRING DIAG: W29.562 (ICD-10-CM) - Pain of right hip   RATIONALE FOR EVALUATION AND TREATMENT: Rehabilitation  THERAPY DIAG: Pain in right hip  Difficulty in walking, not elsewhere classified  Muscle weakness (generalized)  ONSET DATE: Jan 2023  FOLLOW-UP APPT SCHEDULED WITH REFERRING PROVIDER: No f/u scheduled now   SUBJECTIVE:  SUBJECTIVE STATEMENT:  Pt is a 43 year old female with primary complaint of R hip pain; referring diagnosis of R hip OA.   PERTINENT HISTORY: Pt is a 43 year old female with primary complaint of R hip pain; referring diagnosis of R hip OA.   She had an ultrasound-guided cortisone injection in 2023 that only helped slightly. She has been doing activity modification and stretching. She is requesting an order for physical therapy for more stretching and strengthening involving her legs. She is concerned about compensating to the left side. She is  still working and is quite active. She stays away from high-impact activities.   Pt reports that pain began with change in work duties. Pt reports symptoms began with completion of butterfly stretch at night and waking up with notable pain affecting R side. Pt reports continuing with walking regimen - refraining from high impact activity.   History of popping in R hip historically; some remote history of catching/clicking. Pt reports relief of symptoms with Meloxicam and rest. Difficulty with car transfer into mid-size SUV.    PAIN:    Pain Intensity: Present: 0/10, Best: 0/10, Worst: 7/10 Pain location: Anterior hip/groin pain  Pain Quality: burning , sharp with bending toward affected side  Radiating: Yes; pain in R knee also when pushing too far  Numbness/Tingling: No History of buckling: LE can buckle to closed-chain flexion too far  Focal Weakness: No Aggravating factors: trunk/hip flexion, forward bending, bending toward affected hip, high volume of stairs, car transfer Relieving factors: Meloxicam, rest, walking, stretches, hip flexor/lower abdominal exercises 24-hour pain behavior: PM after sitting too long  How long can you sit: How long can you stand: History of prior back injury, pain, surgery, or therapy: Yes; hx of cortisone injection without benefit  Imaging: Yes (see below)  Xrays revealed the right hip with mild femoral head irregularity with inferior femoral neck osteophyte formation. There is no real degenerative joint space narrowing as compared to the left.   Red flags: Negative for bowel/bladder changes, saddle paresthesia, personal history of cancer, h/o spinal tumors, h/o compression fx, h/o abdominal aneurysm, abdominal pain, chills/fever, night sweats, nausea, vomiting, unrelenting pain, first onset of insidious LBP <20 y/o  PRECAUTIONS: None  WEIGHT BEARING RESTRICTIONS: No  FALLS: Has patient fallen in last 6 months? 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  Recent memory is intact.  Remote memory is intact.  Attention span and concentration are intact.  Expressive speech is intact.  Patient's fund of knowledge is within normal limits for educational level.    Gross Musculoskeletal Assessment Tremor: None Bulk: Normal Tone: Normal   GAIT: Distance walked: 30 ft Assistive device utilized: None Level of assistance: Complete Independence Comments: Mild pelvic drop R>L during stance phase, lower extremities externally rotated, increased cadence and decreased stride length   Posture: Lumbar lordosis: WNL Iliac crest height: Equal bilaterally Lumbar lateral shift: Negative  AROM AROM (Normal range in degrees) AROM  08/11/23  Lumbar   Flexion (65) 75% (pain with end-range trunk flexion)  Extension (30) 50% (pulling anterior hip)  Right  lateral flexion (25) WFL  Left lateral flexion (25) WFL  Right rotation (30) 75%  Left rotation (30) 75% (pain with end-range rotation to L)      Hip Right Left  Flexion (125) 90*   Extension (15)    Abduction (40) 30   Adduction     Internal Rotation (45) 32* 35  External Rotation (45) 45 (pain with return to neutral)* WNL      Knee    Flexion (135) WNL   Extension (0) WNL       (* = pain; Blank rows = not tested)  LE MMT: MMT (out of 5) Right 08/11/23 Left 08/11/23  Hip flexion 4-* 4  Hip extension 4+ 5-  Hip abduction (seated) 5 4+ sidelying 5  Hip adduction (seated) 5 5  Hip internal rotation    Hip external rotation     Knee flexion 5 5  Knee extension 5 5  Ankle dorsiflexion    Ankle plantarflexion    Ankle inversion    Ankle eversion    (* = pain; Blank rows = not tested)  Sensation Deferred  Reflexes Deferred  Muscle Length Thomas (hip flexors): R: Positive for mild 10 deg deficit  L: Not examined Ober: R: Not examined L: Not examined  Palpation TTP R distal illiopsoas 1+ (Blank rows = not tested) Graded on 0-4 scale (0 = no pain, 1 = pain, 2 = pain with wincing/grimacing/flinching, 3 = pain with withdrawal, 4 = unwilling to allow palpation)  Passive Accessory Intervertebral Motion Pt denies reproduction of back pain with CPA L1-L5 and UPA bilaterally L1-L5. Generally, hypomobile throughout  Special Tests Lumbar Radiculopathy and Discogenic: Centralization and Peripheralization (SN 92, -LR 0.12): Not examined Slump (SN 83, -LR 0.32): R: Negative L: Negative SLR (SN 92, -LR 0.29): R: Positive L:  Negative  Lumbar Foraminal Stenosis: Lumbar quadrant (SN 70): R: Negative L: Negative  Hip: FABER (SN 81): R: Positive L: Negative Hip scour (SN 50): R: Positive L: Negative  Labrum: Anterior Labral Tear Test: Positive     TODAY'S TREATMENT: DATE: 08/11/23    Therapeutic Exercise - for HEP establishment, discussion on appropriate exercise/activity modification, PT education   Reviewed baseline home exercises and provided handout for Med[employer] program (see Access Code); tactile cueing and therapist demonstration utilized as needed for carryover of proper technique to HEP.    Patient education on current condition, anatomy involved, prognosis, plan of care.       PATIENT EDUCATION:  Education details: see above for patient education details Person educated: Patient Education method: Explanation, Demonstration, and Handouts Education comprehension: verbalized understanding and returned demonstration   HOME EXERCISE PROGRAM:  Access Code: [email or web address] URL:  [email or web address] Date: 08/11/2023 Prepared by: Denese Finn  Exercises - Half Kneeling Hip Flexor Stretch  - 2 x daily - 7 x weekly - 3 sets - 30sec hold - Supine Bridge  - 2 x daily - 7 x weekly - 2 sets - 10 reps - Clamshell  - 2 x daily - 7 x weekly - 2 sets - 10 reps   ASSESSMENT:  CLINICAL IMPRESSION: Patient is a 43 y.o. female who was seen today for physical therapy evaluation and treatment for R hip pain. Clinical exam is consistent with R hip/coxafemoral internal derangement versus referred pain from lumbar spine. Pt does not have significant joint space loss in affected hip; she's not had advanced imaging on R hip, and some testing could indicate labral involvement. Pt does not have paresthesias, sensory  changes, or other indicators of neural involvement. Pt has primary activity limitations with bending, reaching to ground, car transfers, and higher volume of stair negotiation. Associated impairments in R hip stiffness and motion loss, hip flexor/psoas tightness, decreased hip flexor strength and minimal hip extensor weakness, taut/tender distal iliopsoas, and gait changes (see above). Pt will continue to benefit from skilled PT services to address deficits and improve function.   OBJECTIVE IMPAIRMENTS: Abnormal gait, decreased mobility, difficulty walking, decreased ROM, decreased strength, hypomobility, impaired flexibility, and pain.   ACTIVITY LIMITATIONS: bending, sitting, squatting, sleeping, stairs, transfers, and bed mobility  PARTICIPATION LIMITATIONS: driving, community activity, and occupation  PERSONAL FACTORS: Past/current experiences, Time since onset of injury/illness/exacerbation, and 1-2 comorbidities: (HTN, depression) are also affecting patient's functional outcome.   REHAB POTENTIAL: Good  CLINICAL DECISION MAKING: Evolving/moderate complexity  EVALUATION COMPLEXITY: Moderate   GOALS: Goals reviewed with patient? Yes  SHORT TERM  GOALS: Target date: 09/01/2023  Pt will be independent with HEP in order to improve strength and decrease back pain to improve pain-free function at home and work. Baseline: 08/11/23: Baseline HEP initiated.  Goal status: INITIAL   LONG TERM GOALS: Target date: 09/24/2023  Pt will have R hip flexion to 120 deg or greater and ABD to 40 deg or greater without reproduction of groin pain as needed for bed mobility tasks, ability to complete bending lower to ground, and intermittently for self-care ADLs Baseline: 08/11/23: Motion loss with R hip flexion, ER, IR, ABD. Goal status: INITIAL  2.  Pt will decrease worst hip pain by at least 3 points on the NPRS in order to demonstrate clinically significant reduction in back pain. Baseline: 08/11/23: 7/10 at worst Goal status: INITIAL  3.  Pt will improve LEFS by 10 points or greater indicative of clinically meaningful change in pt function   Baseline: 08/11/23: 61/80 Goal status: INITIAL  4.  Patient will negotiate full flight of steps with reciprocal pattern and no significant buckling or movement deviations without reproduction of hip pain.  Baseline: 08/11/23: limited with higher volume of stair climbing.  Goal status: INITIAL  5.  Patient will tolerate full trunk flexion in standing without reproduction of groin pain as needed for reaching to ground and intermittently for self-care ADLs Baseline: 08/11/23: Limited trunk flexion with groin pain.  Goal status: INITIAL   PLAN: PT FREQUENCY: 1-2x/week  PT DURATION: 6 weeks  PLANNED INTERVENTIONS: Therapeutic exercises, Therapeutic activity, Neuromuscular re-education, Balance training, Gait training, Patient/Family education, Self Care, Joint mobilization, Joint manipulation, Vestibular training, Canalith repositioning, Orthotic/Fit training, DME instructions, Dry Needling, Electrical stimulation, Spinal manipulation, Spinal mobilization, Cryotherapy, Moist heat, Taping, Traction, Ultrasound,  Ionotophoresis 4mg /ml Dexamethasone , Manual therapy, and Re-evaluation.  PLAN FOR NEXT SESSION: Mulligan belt techniques to improve tolerance to flexion and ER/IR and for symptom modulation. Psoas stretching. Progressive hip flexor strengthening and hip stabilization.    Denese Finn, PT, DPT #[id number]  Aleatha Hunting, PT 08/13/2023, 10:25 AM

## 2023-08-13 ENCOUNTER — Ambulatory Visit: Admitting: Physical Therapy

## 2023-08-13 ENCOUNTER — Encounter: Payer: Self-pay | Admitting: Physical Therapy

## 2023-08-13 DIAGNOSIS — R262 Difficulty in walking, not elsewhere classified: Secondary | ICD-10-CM

## 2023-08-13 DIAGNOSIS — M25551 Pain in right hip: Secondary | ICD-10-CM

## 2023-08-13 DIAGNOSIS — M6281 Muscle weakness (generalized): Secondary | ICD-10-CM

## 2023-08-13 NOTE — Therapy (Signed)
 OUTPATIENT PHYSICAL THERAPY TREATMENT   Patient Name: Denise Bolton MRN: [id number] DOB:Jul 15, 1980, 43 y.o., female Today's Date: 08/13/2023  END OF SESSION:  PT End of Session - 08/13/23 1346     Visit Number 2    Number of Visits 13    Date for PT Re-Evaluation 09/24/23    PT Start Time 1349    PT Stop Time 1430    PT Time Calculation (min) 41 min    Activity Tolerance Patient limited by pain;Patient tolerated treatment well    Behavior During Therapy [facility] for tasks assessed/performed           Past Medical History:  Diagnosis Date   Anemia    prior to hysterectomy   BRCA negative 10/2014   MyRisk neg; IBIS=11.1%   Depression    Family history of ovarian cancer    MOTHER   Genetic testing of female 10/2014   IBIS=11.1%   GERD (gastroesophageal reflux disease)    Headache    migraines   Hypertension    h/o-came off meds per md instruction and changed lifestyle/diet and bp now controlled with no meds   Menorrhagia    Past Surgical History:  Procedure Laterality Date   ABDOMINAL HYSTERECTOMY     ABDOMINOPLASTY     ABDOMINOPLASTY  2007   FATTY TISSUE REMOVED   AUGMENTATION MAMMAPLASTY Bilateral 2007   BREAST BIOPSY Right 01/07/2022   stereo bx, ribbon marker, path pending   BREAST BIOPSY Right 01/07/2022   MM RT BREAST BX W LOC DEV 1ST LESION IMAGE BX SPEC STEREO GUIDE 01/07/2022 [facility]   BREAST BIOPSY Right 02/05/2022   MM RT RADIO FREQUENCY TAG LOC MAMMO GUIDE 02/05/2022 [facility]   BREAST LUMPECTOMY WITH RADIO FREQUENCY LOCALIZER Right 02/06/2022   Procedure: BREAST LUMPECTOMY WITH RADIO FREQUENCY LOCALIZER;  Surgeon: Alben Alma, MD;  Location: [facility];  Service: General;  Laterality: Right;   BREAST SURGERY     augmentation   CESAREAN SECTION     x 2   CYSTOSCOPY  01/11/2015   Procedure: CYSTOSCOPY;  Surgeon: Darl Edu, MD;  Location: [facility];  Service: Gynecology;;   LAPAROSCOPIC BILATERAL SALPINGECTOMY Bilateral 01/11/2015    Procedure: LAPAROSCOPIC BILATERAL SALPINGECTOMY;  Surgeon: Darl Edu, MD;  Location: [facility];  Service: Gynecology;  Laterality: Bilateral;   LAPAROSCOPIC HYSTERECTOMY N/A 01/11/2015   Procedure: HYSTERECTOMY TOTAL LAPAROSCOPIC;  Surgeon: Darl Edu, MD;  Location: [facility];  Service: Gynecology;  Laterality: N/A;   Patient Active Problem List   Diagnosis Date Noted   Family history of ovarian cancer 03/18/2022   Atypical lobular hyperplasia [facility]) of right breast 02/06/2022   Annual physical exam 04/08/2019   Recurrent major depressive disorder, in partial remission (HCC) 06/22/2017   S/P laparoscopic hysterectomy 01/11/2015   Iron deficiency anemia 08/22/2014   Palpitations 07/11/2013   Preeclampsia 07/11/2013    PCP: Clarise Crooks, MD (Inactive)  REFERRING PROVIDER: Bert Britain, PA-C  REFERRING DIAG: G95.621 (ICD-10-CM) - Pain of right hip   RATIONALE FOR EVALUATION AND TREATMENT: Rehabilitation  THERAPY DIAG: Pain in right hip  Difficulty in walking, not elsewhere classified  Muscle weakness (generalized)  ONSET DATE: Jan 2023  FOLLOW-UP APPT SCHEDULED WITH REFERRING PROVIDER: No f/u scheduled now   SUBJECTIVE:  SUBJECTIVE STATEMENT:  Pt is a 43 year old female with primary complaint of R hip pain; referring diagnosis of R hip OA.   PERTINENT HISTORY: Pt is a 43 year old female with primary complaint of R hip pain; referring diagnosis of R hip OA.   She had an ultrasound-guided cortisone injection in 2023 that only helped slightly. She has been doing activity modification and stretching. She is requesting an order for physical therapy for more stretching and strengthening involving her legs. She is concerned about compensating to the left side. She is still working and is  quite active. She stays away from high-impact activities.   Pt reports that pain began with change in work duties. Pt reports symptoms began with completion of butterfly stretch at night and waking up with notable pain affecting R side. Pt reports continuing with walking regimen - refraining from high impact activity.   History of popping in R hip historically; some remote history of catching/clicking. Pt reports relief of symptoms with Meloxicam and rest. Difficulty with car transfer into mid-size SUV.    PAIN:    Pain Intensity: Present: 0/10, Best: 0/10, Worst: 7/10 Pain location: Anterior hip/groin pain  Pain Quality: burning , sharp with bending toward affected side  Radiating: Yes; pain in R knee also when pushing too far  Numbness/Tingling: No History of buckling: LE can buckle to closed-chain flexion too far  Focal Weakness: No Aggravating factors: trunk/hip flexion, forward bending, bending toward affected hip, high volume of stairs, car transfer Relieving factors: Meloxicam, rest, walking, stretches, hip flexor/lower abdominal exercises 24-hour pain behavior: PM after sitting too long  How long can you sit: How long can you stand: History of prior back injury, pain, surgery, or therapy: Yes; hx of cortisone injection without benefit  Imaging: Yes (see below)  Xrays revealed the right hip with mild femoral head irregularity with inferior femoral neck osteophyte formation. There is no real degenerative joint space narrowing as compared to the left.   Red flags: Negative for bowel/bladder changes, saddle paresthesia, personal history of cancer, h/o spinal tumors, h/o compression fx, h/o abdominal aneurysm, abdominal pain, chills/fever, night sweats, nausea, vomiting, unrelenting pain, first onset of insidious LBP <20 y/o  PRECAUTIONS: None  WEIGHT BEARING RESTRICTIONS: No  FALLS: Has patient fallen in last 6 months? 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  Recent memory is intact.  Remote memory is intact.  Attention span and concentration are intact.  Expressive speech is intact.  Patient's fund of knowledge is within normal limits for educational level.    Gross Musculoskeletal Assessment Tremor: None Bulk: Normal Tone: Normal   GAIT: Distance walked: 30 ft Assistive device utilized: None Level of assistance: Complete Independence Comments: Mild pelvic drop R>L during stance phase, lower extremities externally rotated, increased cadence and decreased stride length   Posture: Lumbar lordosis: WNL Iliac crest height: Equal bilaterally Lumbar lateral shift: Negative  AROM AROM (Normal range in degrees) AROM  08/11/23  Lumbar   Flexion (65) 75% (pain with end-range trunk flexion)  Extension (30) 50% (pulling anterior hip)  Right  lateral flexion (25) WFL  Left lateral flexion (25) WFL  Right rotation (30) 75%  Left rotation (30) 75% (pain with end-range rotation to L)      Hip Right Left  Flexion (125) 90*   Extension (15)    Abduction (40) 30   Adduction     Internal Rotation (45) 32* 35  External Rotation (45) 45 (pain with return to neutral)* WNL      Knee    Flexion (135) WNL   Extension (0) WNL       (* = pain; Blank rows = not tested)  LE MMT: MMT (out of 5) Right 08/11/23 Left 08/11/23  Hip flexion 4-* 4  Hip extension 4+ 5-  Hip abduction (seated) 5 4+ sidelying 5  Hip adduction (seated) 5 5  Hip internal rotation    Hip external rotation    Knee flexion 5 5   Knee extension 5 5  Ankle dorsiflexion    Ankle plantarflexion    Ankle inversion    Ankle eversion    (* = pain; Blank rows = not tested)  Sensation Deferred  Reflexes Deferred  Muscle Length Thomas (hip flexors): R: Positive for mild 10 deg deficit  L: Not examined Ober: R: Not examined L: Not examined  Palpation TTP R distal illiopsoas 1+ (Blank rows = not tested) Graded on 0-4 scale (0 = no pain, 1 = pain, 2 = pain with wincing/grimacing/flinching, 3 = pain with withdrawal, 4 = unwilling to allow palpation)  Passive Accessory Intervertebral Motion Pt denies reproduction of back pain with CPA L1-L5 and UPA bilaterally L1-L5. Generally, hypomobile throughout  Special Tests Lumbar Radiculopathy and Discogenic: Centralization and Peripheralization (SN 92, -LR 0.12): Not examined Slump (SN 83, -LR 0.32): R: Negative L: Negative SLR (SN 92, -LR 0.29): R: Positive L:  Negative  Lumbar Foraminal Stenosis: Lumbar quadrant (SN 70): R: Negative L: Negative  Hip: FABER (SN 81): R: Positive L: Negative Hip scour (SN 50): R: Positive L: Negative  Labrum: Anterior Labral Tear Test: Positive     TODAY'S TREATMENT: DATE: 08/13/2023     SUBJECTIVE STATEMENT:   Patient reports notable R knee pain last night at the gym while she was walking on incline. Patient reports R anterior hip is doing okay this afternoon - nothing unusual. Patient reports no notable pain at rest at arrival. 5/10 pain after performance of lying isotonic drills.    Manual Therapy - for symptom modulation, soft tissue sensitivity and mobility, joint mobility, ROM   Mulligan belt hip mobilization:   -Lateral distraction; 3 x 30 sec bouts  -MWM, hip distraction with conjunct flexion and ER/IR within pt tolerance;    Limited tolerance of flexion, focused on ER/IR with distraction x 10 reps/direction STM/IASTM with Theraband roller along distal iliopsoas/hip flexor group;  x 5 minutes    Therapeutic  Exercise - for improved soft tissue flexibility and extensibility as needed for ROM, improved strength as needed to improve performance of CKC activities/functional movements  Quadruped rockback; x 5 with hip neutral, x 10 wit hip slightly externally rotated (< 30 deg); 5 sec hold  Clamshell; 2 x 10 Bridge; 1 x 10  Supine; hip flexion isotonic with Tband looped around feet; Red Tband; 2 x 10 alternating R/L  3-way kick; x8 ea dir on each LE  -standing adjacent to raised treatment table for light unilateral UE support   PATIENT EDUCATION: Updated and reviewed HEP. Discussed modification to incline walking to improve knee pain.   *  next visit* NuStep; hip flexion < 90 deg; x 5 minutes   PATIENT EDUCATION:  Education details: see above for patient education details Person educated: Patient Education method: Explanation, Demonstration, and Handouts Education comprehension: verbalized understanding and returned demonstration   HOME EXERCISE PROGRAM:  Access Code: [email or web address] URL: [email or web address] Date: 08/13/2023 Prepared by: Denese Finn  Exercises - Half Kneeling Hip Flexor Stretch  - 2 x daily - 7 x weekly - 3 sets - 30sec hold - Supine Bridge  - 2 x daily - 7 x weekly - 2 sets - 10 reps - Clamshell  - 2 x daily - 7 x weekly - 2 sets - 10 reps - Quadruped Rocking Backward  - 2 x daily - 7 x weekly - 2 sets - 10 reps - Supine Hip Flexion with Resistance Loop  - 1 x daily - 7 x weekly - 2 sets - 10 reps   ASSESSMENT:  CLINICAL IMPRESSION: Patient's hip flexion ROM is not notably improved with MWM. She does tolerate ER ROM up to 45 deg with use of MWM. IR is limited to 10-15 deg with or without conjunct distraction/mobilization. Patient has most difficulty with eccentric phase of bridges, clamshells, and open-chain 3-way hip exercise in standing. Pt has remaining deficits in: bending, reaching to ground, car transfers, higher volume of stair negotiation, R hip  stiffness and motion loss, hip flexor/psoas tightness, decreased hip flexor strength and minimal hip extensor weakness, taut/tender distal iliopsoas, and gait changes (see above). Pt will continue to benefit from skilled PT services to address deficits and improve function.   OBJECTIVE IMPAIRMENTS: Abnormal gait, decreased mobility, difficulty walking, decreased ROM, decreased strength, hypomobility, impaired flexibility, and pain.   ACTIVITY LIMITATIONS: bending, sitting, squatting, sleeping, stairs, transfers, and bed mobility  PARTICIPATION LIMITATIONS: driving, community activity, and occupation  PERSONAL FACTORS: Past/current experiences, Time since onset of injury/illness/exacerbation, and 1-2 comorbidities: (HTN, depression) are also affecting patient's functional outcome.   REHAB POTENTIAL: Good  CLINICAL DECISION MAKING: Evolving/moderate complexity  EVALUATION COMPLEXITY: Moderate   GOALS: Goals reviewed with patient? Yes  SHORT TERM GOALS: Target date: 09/01/2023  Pt will be independent with HEP in order to improve strength and decrease back pain to improve pain-free function at home and work. Baseline: 08/11/23: Baseline HEP initiated.  Goal status: INITIAL   LONG TERM GOALS: Target date: 09/24/2023  Pt will have R hip flexion to 120 deg or greater and ABD to 40 deg or greater without reproduction of groin pain as needed for bed mobility tasks, ability to complete bending lower to ground, and intermittently for self-care ADLs Baseline: 08/11/23: Motion loss with R hip flexion, ER, IR, ABD. Goal status: INITIAL  2.  Pt will decrease worst hip pain by at least 3 points on the NPRS in order to demonstrate clinically significant reduction in back pain. Baseline: 08/11/23: 7/10 at worst Goal status: INITIAL  3.  Pt will improve LEFS by 10 points or greater indicative of clinically meaningful change in pt function   Baseline: 08/11/23: 61/80 Goal status: INITIAL  4.  Patient  will negotiate full flight of steps with reciprocal pattern and no significant buckling or movement deviations without reproduction of hip pain.  Baseline: 08/11/23: limited with higher volume of stair climbing.  Goal status: INITIAL  5.  Patient will tolerate full trunk flexion in standing without reproduction of groin pain as needed for reaching to ground and intermittently for self-care ADLs Baseline: 08/11/23: Limited trunk flexion with groin pain.  Goal status:  INITIAL   PLAN: PT FREQUENCY: 1-2x/week  PT DURATION: 6 weeks  PLANNED INTERVENTIONS: Therapeutic exercises, Therapeutic activity, Neuromuscular re-education, Balance training, Gait training, Patient/Family education, Self Care, Joint mobilization, Joint manipulation, Vestibular training, Canalith repositioning, Orthotic/Fit training, DME instructions, Dry Needling, Electrical stimulation, Spinal manipulation, Spinal mobilization, Cryotherapy, Moist heat, Taping, Traction, Ultrasound, Ionotophoresis 4mg /ml Dexamethasone , Manual therapy, and Re-evaluation.  PLAN FOR NEXT SESSION: Mulligan belt techniques to improve tolerance to flexion and ER/IR and for symptom modulation. Psoas stretching. Progressive hip flexor strengthening and hip stabilization.    Denese Finn, PT, DPT #[id number]  Aleatha Hunting, PT 08/13/2023, 1:46 PM

## 2023-08-18 ENCOUNTER — Ambulatory Visit: Admitting: Physical Therapy

## 2023-08-18 DIAGNOSIS — R262 Difficulty in walking, not elsewhere classified: Secondary | ICD-10-CM | POA: Diagnosis not present

## 2023-08-18 DIAGNOSIS — M6281 Muscle weakness (generalized): Secondary | ICD-10-CM | POA: Diagnosis not present

## 2023-08-18 DIAGNOSIS — M25551 Pain in right hip: Secondary | ICD-10-CM | POA: Diagnosis not present

## 2023-08-18 NOTE — Therapy (Signed)
 OUTPATIENT PHYSICAL THERAPY TREATMENT   Patient Name: Denise Bolton MRN: [id number] DOB:[birth year], 43 y.o., female Today's Date: 08/18/2023  END OF SESSION:  PT End of Session - 08/18/23 1602     Visit Number 3    Number of Visits 13    Date for PT Re-Evaluation 09/24/23    PT Start Time 1603    PT Stop Time 1644    PT Time Calculation (min) 41 min    Activity Tolerance Patient limited by pain;Patient tolerated treatment well    Behavior During Therapy [facility] for tasks assessed/performed            Past Medical History:  Diagnosis Date   Anemia    prior to hysterectomy   BRCA negative 10/2014   MyRisk neg; IBIS=11.1%   Depression    Family history of ovarian cancer    MOTHER   Genetic testing of female 10/2014   IBIS=11.1%   GERD (gastroesophageal reflux disease)    Headache    migraines   Hypertension    h/o-came off meds per md instruction and changed lifestyle/diet and bp now controlled with no meds   Menorrhagia    Past Surgical History:  Procedure Laterality Date   ABDOMINAL HYSTERECTOMY     ABDOMINOPLASTY     ABDOMINOPLASTY  2007   FATTY TISSUE REMOVED   AUGMENTATION MAMMAPLASTY Bilateral 2007   BREAST BIOPSY Right 01/07/2022   stereo bx, ribbon marker, path pending   BREAST BIOPSY Right 01/07/2022   MM RT BREAST BX W LOC DEV 1ST LESION IMAGE BX SPEC STEREO GUIDE 01/07/2022 [facility]   BREAST BIOPSY Right 02/05/2022   MM RT RADIO FREQUENCY TAG LOC MAMMO GUIDE 02/05/2022 [facility]   BREAST LUMPECTOMY WITH RADIO FREQUENCY LOCALIZER Right 02/06/2022   Procedure: BREAST LUMPECTOMY WITH RADIO FREQUENCY LOCALIZER;  Surgeon: Alben Alma, MD;  Location: [facility];  Service: General;  Laterality: Right;   BREAST SURGERY     augmentation   CESAREAN SECTION     x 2   CYSTOSCOPY  01/11/2015   Procedure: CYSTOSCOPY;  Surgeon: Darl Edu, MD;  Location: [facility];  Service: Gynecology;;   LAPAROSCOPIC BILATERAL SALPINGECTOMY Bilateral  01/11/2015   Procedure: LAPAROSCOPIC BILATERAL SALPINGECTOMY;  Surgeon: Darl Edu, MD;  Location: [facility];  Service: Gynecology;  Laterality: Bilateral;   LAPAROSCOPIC HYSTERECTOMY N/A 01/11/2015   Procedure: HYSTERECTOMY TOTAL LAPAROSCOPIC;  Surgeon: Darl Edu, MD;  Location: [facility];  Service: Gynecology;  Laterality: N/A;   Patient Active Problem List   Diagnosis Date Noted   Family history of ovarian cancer 03/18/2022   Atypical lobular hyperplasia [facility]) of right breast 02/06/2022   Annual physical exam 04/08/2019   Recurrent major depressive disorder, in partial remission (HCC) 06/22/2017   S/P laparoscopic hysterectomy 01/11/2015   Iron deficiency anemia 08/22/2014   Palpitations 07/11/2013   Preeclampsia 07/11/2013    PCP: Clarise Crooks, MD (Inactive)  REFERRING PROVIDER: Bert Britain, PA-C  REFERRING DIAG: U44.034 (ICD-10-CM) - Pain of right hip   RATIONALE FOR EVALUATION AND TREATMENT: Rehabilitation  THERAPY DIAG: Pain in right hip  Difficulty in walking, not elsewhere classified  Muscle weakness (generalized)  ONSET DATE: Jan 2023  FOLLOW-UP APPT SCHEDULED WITH REFERRING PROVIDER: No f/u scheduled now   SUBJECTIVE:  SUBJECTIVE STATEMENT:  Pt is a 43 year old female with primary complaint of R hip pain; referring diagnosis of R hip OA.   PERTINENT HISTORY: Pt is a 43 year old female with primary complaint of R hip pain; referring diagnosis of R hip OA.   She had an ultrasound-guided cortisone injection in 2023 that only helped slightly. She has been doing activity modification and stretching. She is requesting an order for physical therapy for more stretching and strengthening involving her legs. She is concerned about compensating to the left side. She is still  working and is quite active. She stays away from high-impact activities.   Pt reports that pain began with change in work duties. Pt reports symptoms began with completion of butterfly stretch at night and waking up with notable pain affecting R side. Pt reports continuing with walking regimen - refraining from high impact activity.   History of popping in R hip historically; some remote history of catching/clicking. Pt reports relief of symptoms with Meloxicam and rest. Difficulty with car transfer into mid-size SUV.    PAIN:    Pain Intensity: Present: 0/10, Best: 0/10, Worst: 7/10 Pain location: Anterior hip/groin pain  Pain Quality: burning , sharp with bending toward affected side  Radiating: Yes; pain in R knee also when pushing too far  Numbness/Tingling: No History of buckling: LE can buckle to closed-chain flexion too far  Focal Weakness: No Aggravating factors: trunk/hip flexion, forward bending, bending toward affected hip, high volume of stairs, car transfer Relieving factors: Meloxicam, rest, walking, stretches, hip flexor/lower abdominal exercises 24-hour pain behavior: PM after sitting too long  How long can you sit: How long can you stand: History of prior back injury, pain, surgery, or therapy: Yes; hx of cortisone injection without benefit  Imaging: Yes (see below)  Xrays revealed the right hip with mild femoral head irregularity with inferior femoral neck osteophyte formation. There is no real degenerative joint space narrowing as compared to the left.   Red flags: Negative for bowel/bladder changes, saddle paresthesia, personal history of cancer, h/o spinal tumors, h/o compression fx, h/o abdominal aneurysm, abdominal pain, chills/fever, night sweats, nausea, vomiting, unrelenting pain, first onset of insidious LBP <20 y/o  PRECAUTIONS: None  WEIGHT BEARING RESTRICTIONS: No  FALLS: Has patient fallen in last 6 months? 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  Recent memory is intact.  Remote memory is intact.  Attention span and concentration are intact.  Expressive speech is intact.  Patient's fund of knowledge is within normal limits for educational level.    Gross Musculoskeletal Assessment Tremor: None Bulk: Normal Tone: Normal   GAIT: Distance walked: 30 ft Assistive device utilized: None Level of assistance: Complete Independence Comments: Mild pelvic drop R>L during stance phase, lower extremities externally rotated, increased cadence and decreased stride length   Posture: Lumbar lordosis: WNL Iliac crest height: Equal bilaterally Lumbar lateral shift: Negative  AROM AROM (Normal range in degrees) AROM  08/11/23  Lumbar   Flexion (65) 75% (pain with end-range trunk flexion)  Extension (30) 50% (pulling anterior hip)  Right  lateral flexion (25) WFL  Left lateral flexion (25) WFL  Right rotation (30) 75%  Left rotation (30) 75% (pain with end-range rotation to L)      Hip Right Left  Flexion (125) 90*   Extension (15)    Abduction (40) 30   Adduction     Internal Rotation (45) 32* 35  External Rotation (45) 45 (pain with return to neutral)* WNL      Knee    Flexion (135) WNL   Extension (0) WNL       (* = pain; Blank rows = not tested)  LE MMT: MMT (out of 5) Right 08/11/23 Left 08/11/23  Hip flexion 4-* 4  Hip extension 4+ 5-  Hip abduction (seated) 5 4+ sidelying 5  Hip adduction (seated) 5 5  Hip internal rotation    Hip external rotation    Knee  flexion 5 5  Knee extension 5 5  Ankle dorsiflexion    Ankle plantarflexion    Ankle inversion    Ankle eversion    (* = pain; Blank rows = not tested)  Sensation Deferred  Reflexes Deferred  Muscle Length Thomas (hip flexors): R: Positive for mild 10 deg deficit  L: Not examined Ober: R: Not examined L: Not examined  Palpation TTP R distal illiopsoas 1+ (Blank rows = not tested) Graded on 0-4 scale (0 = no pain, 1 = pain, 2 = pain with wincing/grimacing/flinching, 3 = pain with withdrawal, 4 = unwilling to allow palpation)  Passive Accessory Intervertebral Motion Pt denies reproduction of back pain with CPA L1-L5 and UPA bilaterally L1-L5. Generally, hypomobile throughout  Special Tests Lumbar Radiculopathy and Discogenic: Centralization and Peripheralization (SN 92, -LR 0.12): Not examined Slump (SN 83, -LR 0.32): R: Negative L: Negative SLR (SN 92, -LR 0.29): R: Positive L:  Negative  Lumbar Foraminal Stenosis: Lumbar quadrant (SN 70): R: Negative L: Negative  Hip: FABER (SN 81): R: Positive L: Negative Hip scour (SN 50): R: Positive L: Negative  Labrum: Anterior Labral Tear Test: Positive     TODAY'S TREATMENT: DATE: 08/18/2023     SUBJECTIVE STATEMENT:   Patient reports getting deeper into her squats Friday. She reports moving items out of attic and having to traverse stairs many times. Patient reports pain at baseline with shifting her weight onto R groin region.     Manual Therapy - for symptom modulation, soft tissue sensitivity and mobility, joint mobility, ROM   Mulligan belt hip mobilization:   -Lateral distraction; 2 x 30 sec bouts  -Inferolateral glide with conjunct flexion; x 10 reps Prone ER/IR PROM, alternating; x 15 reps  -normal ER exhibited with hip neutral, IR only to 15 deg STM along distal iliopsoas/hip flexor group;  x 5 minutes    Therapeutic Exercise - for improved soft tissue flexibility and extensibility as needed for ROM,  improved strength as needed to improve performance of CKC activities/functional movements  Ely's Test: R Positive, L Positive   Supine; hip flexion isotonic with Tband looped around feet; alternating R/L  -attempted Green Tband; 1 x 8 alt  -Red Tband; 1 x 10 alt  Prone Quad stretch; 3 x 30 sec, with blanket looped around foot  Bridge; 1 x 10  3-way kick; x8 ea dir on each LE  -standing adjacent to raised treatment table for light unilateral UE support  PATIENT EDUCATION: discussed use of prone quad stretch for positive Ely's test today.    *next visit* Clamshell; 2 x 10 NuStep; hip  flexion < 90 deg; x 5 minutes  *not today* Quadruped rockback; x 5 with hip neutral, x 10 wit hip slightly externally rotated (< 30 deg); 5 sec hold    PATIENT EDUCATION:  Education details: see above for patient education details Person educated: Patient Education method: Explanation, Demonstration, and Handouts Education comprehension: verbalized understanding and returned demonstration   HOME EXERCISE PROGRAM:  Access Code: [email or web address] URL: [email or web address] Date: 08/13/2023 Prepared by: Denese Finn  Exercises - Half Kneeling Hip Flexor Stretch  - 2 x daily - 7 x weekly - 3 sets - 30sec hold - Supine Bridge  - 2 x daily - 7 x weekly - 2 sets - 10 reps - Clamshell  - 2 x daily - 7 x weekly - 2 sets - 10 reps - Quadruped Rocking Backward  - 2 x daily - 7 x weekly - 2 sets - 10 reps - Supine Hip Flexion with Resistance Loop  - 1 x daily - 7 x weekly - 2 sets - 10 reps   ASSESSMENT:  CLINICAL IMPRESSION: Patient is able to better tolerate flexion and ER with mobilization technique today. She has decreased tolerance for closed-chain loading due to flare-up with activity over the weekend (going up/down stairs to clean attic). Pt has ongoing significant R hip stiffness and motion loss. Most significant deficit in R hip IR. Patient has positive Ely's test; HEP updated to  include quad stretching. Pt has remaining deficits in: bending, reaching to ground, car transfers, higher volume of stair negotiation, R hip stiffness and motion loss, hip flexor/psoas tightness, decreased hip flexor strength and minimal hip extensor weakness, taut/tender distal iliopsoas, and gait changes (see above). Pt will continue to benefit from skilled PT services to address deficits and improve function.   OBJECTIVE IMPAIRMENTS: Abnormal gait, decreased mobility, difficulty walking, decreased ROM, decreased strength, hypomobility, impaired flexibility, and pain.   ACTIVITY LIMITATIONS: bending, sitting, squatting, sleeping, stairs, transfers, and bed mobility  PARTICIPATION LIMITATIONS: driving, community activity, and occupation  PERSONAL FACTORS: Past/current experiences, Time since onset of injury/illness/exacerbation, and 1-2 comorbidities: (HTN, depression) are also affecting patient's functional outcome.   REHAB POTENTIAL: Good  CLINICAL DECISION MAKING: Evolving/moderate complexity  EVALUATION COMPLEXITY: Moderate   GOALS: Goals reviewed with patient? Yes  SHORT TERM GOALS: Target date: 09/01/2023  Pt will be independent with HEP in order to improve strength and decrease back pain to improve pain-free function at home and work. Baseline: 08/11/23: Baseline HEP initiated.  Goal status: INITIAL   LONG TERM GOALS: Target date: 09/24/2023  Pt will have R hip flexion to 120 deg or greater and ABD to 40 deg or greater without reproduction of groin pain as needed for bed mobility tasks, ability to complete bending lower to ground, and intermittently for self-care ADLs Baseline: 08/11/23: Motion loss with R hip flexion, ER, IR, ABD. Goal status: INITIAL  2.  Pt will decrease worst hip pain by at least 3 points on the NPRS in order to demonstrate clinically significant reduction in back pain. Baseline: 08/11/23: 7/10 at worst Goal status: INITIAL  3.  Pt will improve LEFS by 10  points or greater indicative of clinically meaningful change in pt function   Baseline: 08/11/23: 61/80 Goal status: INITIAL  4.  Patient will negotiate full flight of steps with reciprocal pattern and no significant buckling or movement deviations without reproduction of hip pain.  Baseline: 08/11/23: limited with higher volume of stair climbing.  Goal status: INITIAL  5.  Patient will tolerate full  trunk flexion in standing without reproduction of groin pain as needed for reaching to ground and intermittently for self-care ADLs Baseline: 08/11/23: Limited trunk flexion with groin pain.  Goal status: INITIAL   PLAN: PT FREQUENCY: 1-2x/week  PT DURATION: 6 weeks  PLANNED INTERVENTIONS: Therapeutic exercises, Therapeutic activity, Neuromuscular re-education, Balance training, Gait training, Patient/Family education, Self Care, Joint mobilization, Joint manipulation, Vestibular training, Canalith repositioning, Orthotic/Fit training, DME instructions, Dry Needling, Electrical stimulation, Spinal manipulation, Spinal mobilization, Cryotherapy, Moist heat, Taping, Traction, Ultrasound, Ionotophoresis 4mg /ml Dexamethasone , Manual therapy, and Re-evaluation.  PLAN FOR NEXT SESSION: Mulligan belt techniques to improve tolerance to flexion and ER/IR and for symptom modulation. Psoas stretching. Progressive hip flexor strengthening and hip stabilization.    Denese Finn, PT, DPT #[id number]  Aleatha Hunting, PT 08/18/2023, 4:03 PM

## 2023-08-19 ENCOUNTER — Encounter: Payer: Self-pay | Admitting: Physical Therapy

## 2023-08-20 ENCOUNTER — Encounter: Payer: Self-pay | Admitting: Physical Therapy

## 2023-08-20 ENCOUNTER — Ambulatory Visit

## 2023-08-20 DIAGNOSIS — M6281 Muscle weakness (generalized): Secondary | ICD-10-CM

## 2023-08-20 DIAGNOSIS — M25551 Pain in right hip: Secondary | ICD-10-CM

## 2023-08-20 DIAGNOSIS — R262 Difficulty in walking, not elsewhere classified: Secondary | ICD-10-CM | POA: Diagnosis not present

## 2023-08-20 NOTE — Therapy (Signed)
 OUTPATIENT PHYSICAL THERAPY TREATMENT   Patient Name: Denise Bolton MRN: [id number] DOB:1980-07-01, 43 y.o., female Today's Date: 08/20/2023  END OF SESSION:  PT End of Session - 08/20/23 1600     Visit Number 4    Number of Visits 13    Date for PT Re-Evaluation 09/24/23    PT Start Time 1601    PT Stop Time 1642    PT Time Calculation (min) 41 min    Activity Tolerance Patient limited by pain;Patient tolerated treatment well    Behavior During Therapy [facility] for tasks assessed/performed             Past Medical History:  Diagnosis Date   Anemia    prior to hysterectomy   BRCA negative 10/2014   MyRisk neg; IBIS=11.1%   Depression    Family history of ovarian cancer    MOTHER   Genetic testing of female 10/2014   IBIS=11.1%   GERD (gastroesophageal reflux disease)    Headache    migraines   Hypertension    h/o-came off meds per md instruction and changed lifestyle/diet and bp now controlled with no meds   Menorrhagia    Past Surgical History:  Procedure Laterality Date   ABDOMINAL HYSTERECTOMY     ABDOMINOPLASTY     ABDOMINOPLASTY  2007   FATTY TISSUE REMOVED   AUGMENTATION MAMMAPLASTY Bilateral 2007   BREAST BIOPSY Right 01/07/2022   stereo bx, ribbon marker, path pending   BREAST BIOPSY Right 01/07/2022   MM RT BREAST BX W LOC DEV 1ST LESION IMAGE BX SPEC STEREO GUIDE 01/07/2022 [facility]   BREAST BIOPSY Right 02/05/2022   MM RT RADIO FREQUENCY TAG LOC MAMMO GUIDE 02/05/2022 [facility]   BREAST LUMPECTOMY WITH RADIO FREQUENCY LOCALIZER Right 02/06/2022   Procedure: BREAST LUMPECTOMY WITH RADIO FREQUENCY LOCALIZER;  Surgeon: Alben Alma, MD;  Location: [facility];  Service: General;  Laterality: Right;   BREAST SURGERY     augmentation   CESAREAN SECTION     x 2   CYSTOSCOPY  01/11/2015   Procedure: CYSTOSCOPY;  Surgeon: Darl Edu, MD;  Location: [facility];  Service: Gynecology;;   LAPAROSCOPIC BILATERAL SALPINGECTOMY Bilateral  01/11/2015   Procedure: LAPAROSCOPIC BILATERAL SALPINGECTOMY;  Surgeon: Darl Edu, MD;  Location: [facility];  Service: Gynecology;  Laterality: Bilateral;   LAPAROSCOPIC HYSTERECTOMY N/A 01/11/2015   Procedure: HYSTERECTOMY TOTAL LAPAROSCOPIC;  Surgeon: Darl Edu, MD;  Location: [facility];  Service: Gynecology;  Laterality: N/A;   Patient Active Problem List   Diagnosis Date Noted   Family history of ovarian cancer 03/18/2022   Atypical lobular hyperplasia [facility]) of right breast 02/06/2022   Annual physical exam 04/08/2019   Recurrent major depressive disorder, in partial remission (HCC) 06/22/2017   S/P laparoscopic hysterectomy 01/11/2015   Iron deficiency anemia 08/22/2014   Palpitations 07/11/2013   Preeclampsia 07/11/2013    PCP: Clarise Crooks, MD (Inactive)  REFERRING PROVIDER: Bert Britain, PA-C  REFERRING DIAG: W09.811 (ICD-10-CM) - Pain of right hip   RATIONALE FOR EVALUATION AND TREATMENT: Rehabilitation  THERAPY DIAG: Pain in right hip  Difficulty in walking, not elsewhere classified  Muscle weakness (generalized)  ONSET DATE: Jan 2023  FOLLOW-UP APPT SCHEDULED WITH REFERRING PROVIDER: No f/u scheduled now   SUBJECTIVE:  SUBJECTIVE STATEMENT:  Pt is a 43 year old female with primary complaint of R hip pain; referring diagnosis of R hip OA.   PERTINENT HISTORY: Pt is a 43 year old female with primary complaint of R hip pain; referring diagnosis of R hip OA.   She had an ultrasound-guided cortisone injection in 2023 that only helped slightly. She has been doing activity modification and stretching. She is requesting an order for physical therapy for more stretching and strengthening involving her legs. She is concerned about compensating to the left side. She is still  working and is quite active. She stays away from high-impact activities.   Pt reports that pain began with change in work duties. Pt reports symptoms began with completion of butterfly stretch at night and waking up with notable pain affecting R side. Pt reports continuing with walking regimen - refraining from high impact activity.   History of popping in R hip historically; some remote history of catching/clicking. Pt reports relief of symptoms with Meloxicam and rest. Difficulty with car transfer into mid-size SUV.    PAIN:    Pain Intensity: Present: 0/10, Best: 0/10, Worst: 7/10 Pain location: Anterior hip/groin pain  Pain Quality: burning , sharp with bending toward affected side  Radiating: Yes; pain in R knee also when pushing too far  Numbness/Tingling: No History of buckling: LE can buckle to closed-chain flexion too far  Focal Weakness: No Aggravating factors: trunk/hip flexion, forward bending, bending toward affected hip, high volume of stairs, car transfer Relieving factors: Meloxicam, rest, walking, stretches, hip flexor/lower abdominal exercises 24-hour pain behavior: PM after sitting too long  How long can you sit: How long can you stand: History of prior back injury, pain, surgery, or therapy: Yes; hx of cortisone injection without benefit  Imaging: Yes (see below)  Xrays revealed the right hip with mild femoral head irregularity with inferior femoral neck osteophyte formation. There is no real degenerative joint space narrowing as compared to the left.   Red flags: Negative for bowel/bladder changes, saddle paresthesia, personal history of cancer, h/o spinal tumors, h/o compression fx, h/o abdominal aneurysm, abdominal pain, chills/fever, night sweats, nausea, vomiting, unrelenting pain, first onset of insidious LBP <20 y/o  PRECAUTIONS: None  WEIGHT BEARING RESTRICTIONS: No  FALLS: Has patient fallen in last 6 months? 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  Recent memory is intact.  Remote memory is intact.  Attention span and concentration are intact.  Expressive speech is intact.  Patient's fund of knowledge is within normal limits for educational level.    Gross Musculoskeletal Assessment Tremor: None Bulk: Normal Tone: Normal   GAIT: Distance walked: 30 ft Assistive device utilized: None Level of assistance: Complete Independence Comments: Mild pelvic drop R>L during stance phase, lower extremities externally rotated, increased cadence and decreased stride length   Posture: Lumbar lordosis: WNL Iliac crest height: Equal bilaterally Lumbar lateral shift: Negative  AROM AROM (Normal range in degrees) AROM  08/11/23  Lumbar   Flexion (65) 75% (pain with end-range trunk flexion)  Extension (30) 50% (pulling anterior hip)  Right  lateral flexion (25) WFL  Left lateral flexion (25) WFL  Right rotation (30) 75%  Left rotation (30) 75% (pain with end-range rotation to L)      Hip Right Left  Flexion (125) 90*   Extension (15)    Abduction (40) 30   Adduction     Internal Rotation (45) 32* 35  External Rotation (45) 45 (pain with return to neutral)* WNL      Knee    Flexion (135) WNL   Extension (0) WNL       (* = pain; Blank rows = not tested)  LE MMT: MMT (out of 5) Right 08/11/23 Left 08/11/23  Hip flexion 4-* 4  Hip extension 4+ 5-  Hip abduction (seated) 5 4+ sidelying 5  Hip adduction (seated) 5 5  Hip internal rotation    Hip external rotation    Knee  flexion 5 5  Knee extension 5 5  Ankle dorsiflexion    Ankle plantarflexion    Ankle inversion    Ankle eversion    (* = pain; Blank rows = not tested)  Sensation Deferred  Reflexes Deferred  Muscle Length Thomas (hip flexors): R: Positive for mild 10 deg deficit  L: Not examined Ober: R: Not examined L: Not examined  Palpation TTP R distal illiopsoas 1+ (Blank rows = not tested) Graded on 0-4 scale (0 = no pain, 1 = pain, 2 = pain with wincing/grimacing/flinching, 3 = pain with withdrawal, 4 = unwilling to allow palpation)  Passive Accessory Intervertebral Motion Pt denies reproduction of back pain with CPA L1-L5 and UPA bilaterally L1-L5. Generally, hypomobile throughout  Special Tests Lumbar Radiculopathy and Discogenic: Centralization and Peripheralization (SN 92, -LR 0.12): Not examined Slump (SN 83, -LR 0.32): R: Negative L: Negative SLR (SN 92, -LR 0.29): R: Positive L:  Negative  Lumbar Foraminal Stenosis: Lumbar quadrant (SN 70): R: Negative L: Negative  Hip: FABER (SN 81): R: Positive L: Negative Hip scour (SN 50): R: Positive L: Negative  Labrum: Anterior Labral Tear Test: Positive     TODAY'S TREATMENT: DATE: 08/20/2023   SUBJECTIVE STATEMENT:   Patient reports 2/10 pain this date. States she is able to get a little deeper in her squat.   Manual Therapy - for symptom modulation, soft tissue sensitivity and mobility, joint mobility, ROM   Mulligan belt hip mobilization:   -Lateral distraction   -Long axis distraction  Therapeutic Exercise - for improved soft tissue flexibility and extensibility as needed for ROM, improved strength as needed to improve performance of CKC activities/functional movements  Nustep level 3 x 7 minutes - BLE only -- patient reporting discomfort after minute 5 - seat #7  Modified Thomas stretch 2 x 30 seconds  Prone Quad stretch; 3 x 30 sec,   3-way kick; x8 ea dir on each LE  -standing adjacent to raised treatment  table for light unilateral UE support  TRX squats (to tolerance) 2 x 15   Knee flexion stretch on 2nd step 2 x 30 seconds on R   Kneeling hip flexor stretch 2 x 30 seconds on R   PATIENT EDUCATION: discussed use of prone quad stretch for positive Ely's test today.    *not today* Quadruped rockback; x 5 with hip neutral, x 10 wit hip slightly externally rotated (< 30 deg); 5 sec hold    PATIENT EDUCATION:  Education details: see above for patient education details Person educated: Patient Education method: Explanation, Demonstration, and Handouts Education comprehension: verbalized understanding and  returned demonstration   HOME EXERCISE PROGRAM:  Access Code: [email or web address] URL: [email or web address] Date: 08/13/2023 Prepared by: Denese Finn  Exercises - Half Kneeling Hip Flexor Stretch  - 2 x daily - 7 x weekly - 3 sets - 30sec hold - Supine Bridge  - 2 x daily - 7 x weekly - 2 sets - 10 reps - Clamshell  - 2 x daily - 7 x weekly - 2 sets - 10 reps - Quadruped Rocking Backward  - 2 x daily - 7 x weekly - 2 sets - 10 reps - Supine Hip Flexion with Resistance Loop  - 1 x daily - 7 x weekly - 2 sets - 10 reps   ASSESSMENT:  CLINICAL IMPRESSION:   Patient arrives to treatment session motivated to participate. Introduced use of Nustep within comfortable range with good tolerance. Session focused on stretching of hip flexors and squat depth with use of TRX straps. Tolerated session well with no increase in pain. Pt has remaining deficits in: bending, reaching to ground, car transfers, higher volume of stair negotiation, R hip stiffness and motion loss, hip flexor/psoas tightness, decreased hip flexor strength and minimal hip extensor weakness, taut/tender distal iliopsoas, and gait changes (see above). Pt will continue to benefit from skilled PT services to address deficits and improve function.   OBJECTIVE IMPAIRMENTS: Abnormal gait, decreased mobility, difficulty  walking, decreased ROM, decreased strength, hypomobility, impaired flexibility, and pain.   ACTIVITY LIMITATIONS: bending, sitting, squatting, sleeping, stairs, transfers, and bed mobility  PARTICIPATION LIMITATIONS: driving, community activity, and occupation  PERSONAL FACTORS: Past/current experiences, Time since onset of injury/illness/exacerbation, and 1-2 comorbidities: (HTN, depression) are also affecting patient's functional outcome.   REHAB POTENTIAL: Good  CLINICAL DECISION MAKING: Evolving/moderate complexity  EVALUATION COMPLEXITY: Moderate   GOALS: Goals reviewed with patient? Yes  SHORT TERM GOALS: Target date: 09/01/2023  Pt will be independent with HEP in order to improve strength and decrease back pain to improve pain-free function at home and work. Baseline: 08/11/23: Baseline HEP initiated.  Goal status: INITIAL   LONG TERM GOALS: Target date: 09/24/2023  Pt will have R hip flexion to 120 deg or greater and ABD to 40 deg or greater without reproduction of groin pain as needed for bed mobility tasks, ability to complete bending lower to ground, and intermittently for self-care ADLs Baseline: 08/11/23: Motion loss with R hip flexion, ER, IR, ABD. Goal status: INITIAL  2.  Pt will decrease worst hip pain by at least 3 points on the NPRS in order to demonstrate clinically significant reduction in back pain. Baseline: 08/11/23: 7/10 at worst Goal status: INITIAL  3.  Pt will improve LEFS by 10 points or greater indicative of clinically meaningful change in pt function   Baseline: 08/11/23: 61/80 Goal status: INITIAL  4.  Patient will negotiate full flight of steps with reciprocal pattern and no significant buckling or movement deviations without reproduction of hip pain.  Baseline: 08/11/23: limited with higher volume of stair climbing.  Goal status: INITIAL  5.  Patient will tolerate full trunk flexion in standing without reproduction of groin pain as needed for  reaching to ground and intermittently for self-care ADLs Baseline: 08/11/23: Limited trunk flexion with groin pain.  Goal status: INITIAL   PLAN: PT FREQUENCY: 1-2x/week  PT DURATION: 6 weeks  PLANNED INTERVENTIONS: Therapeutic exercises, Therapeutic activity, Neuromuscular re-education, Balance training, Gait training, Patient/Family education, Self Care, Joint mobilization, Joint manipulation, Vestibular training, Canalith repositioning, Orthotic/Fit training, DME instructions, Dry Needling, Electrical stimulation, Spinal  manipulation, Spinal mobilization, Cryotherapy, Moist heat, Taping, Traction, Ultrasound, Ionotophoresis 4mg /ml Dexamethasone , Manual therapy, and Re-evaluation.  PLAN FOR NEXT SESSION: Mulligan belt techniques to improve tolerance to flexion and ER/IR and for symptom modulation. Psoas stretching. Progressive hip flexor strengthening and hip stabilization.    Janine Melbourne, PT, DPT Physical Therapist - [facility] 08/20/2023, 4:03 PM

## 2023-08-25 ENCOUNTER — Ambulatory Visit: Admitting: Physical Therapy

## 2023-08-25 NOTE — Therapy (Deleted)
 OUTPATIENT PHYSICAL THERAPY TREATMENT   Patient Name: Denise Bolton MRN: [id number] DOB:Dec 15, 1980, 43 y.o., female Today's Date: 08/25/2023  END OF SESSION:       Past Medical History:  Diagnosis Date   Anemia    prior to hysterectomy   BRCA negative 10/2014   MyRisk neg; IBIS=11.1%   Depression    Family history of ovarian cancer    MOTHER   Genetic testing of female 10/2014   IBIS=11.1%   GERD (gastroesophageal reflux disease)    Headache    migraines   Hypertension    h/o-came off meds per md instruction and changed lifestyle/diet and bp now controlled with no meds   Menorrhagia    Past Surgical History:  Procedure Laterality Date   ABDOMINAL HYSTERECTOMY     ABDOMINOPLASTY     ABDOMINOPLASTY  2007   FATTY TISSUE REMOVED   AUGMENTATION MAMMAPLASTY Bilateral 2007   BREAST BIOPSY Right 01/07/2022   stereo bx, ribbon marker, path pending   BREAST BIOPSY Right 01/07/2022   MM RT BREAST BX W LOC DEV 1ST LESION IMAGE BX SPEC STEREO GUIDE 01/07/2022 [facility]   BREAST BIOPSY Right 02/05/2022   MM RT RADIO FREQUENCY TAG LOC MAMMO GUIDE 02/05/2022 [facility]   BREAST LUMPECTOMY WITH RADIO FREQUENCY LOCALIZER Right 02/06/2022   Procedure: BREAST LUMPECTOMY WITH RADIO FREQUENCY LOCALIZER;  Surgeon: Jordis Laneta FALCON, MD;  Location: [facility];  Service: General;  Laterality: Right;   BREAST SURGERY     augmentation   CESAREAN SECTION     x 2   CYSTOSCOPY  01/11/2015   Procedure: CYSTOSCOPY;  Surgeon: Glory High, MD;  Location: [facility];  Service: Gynecology;;   LAPAROSCOPIC BILATERAL SALPINGECTOMY Bilateral 01/11/2015   Procedure: LAPAROSCOPIC BILATERAL SALPINGECTOMY;  Surgeon: Glory High, MD;  Location: [facility];  Service: Gynecology;  Laterality: Bilateral;   LAPAROSCOPIC HYSTERECTOMY N/A 01/11/2015   Procedure: HYSTERECTOMY TOTAL LAPAROSCOPIC;  Surgeon: Glory High, MD;  Location: [facility];  Service: Gynecology;  Laterality: N/A;   Patient  Active Problem List   Diagnosis Date Noted   Family history of ovarian cancer 03/18/2022   Atypical lobular hyperplasia [facility]) of right breast 02/06/2022   Annual physical exam 04/08/2019   Recurrent major depressive disorder, in partial remission (HCC) 06/22/2017   S/P laparoscopic hysterectomy 01/11/2015   Iron deficiency anemia 08/22/2014   Palpitations 07/11/2013   Preeclampsia 07/11/2013    PCP: Joshua Cathryne BROCKS, MD (Inactive)  REFERRING PROVIDER: Verlinda Boas, PA-C  REFERRING DIAG: F74.448 (ICD-10-CM) - Pain of right hip   RATIONALE FOR EVALUATION AND TREATMENT: Rehabilitation  THERAPY DIAG: Pain in right hip  Difficulty in walking, not elsewhere classified  Muscle weakness (generalized)  ONSET DATE: Jan 2023  FOLLOW-UP APPT SCHEDULED WITH REFERRING PROVIDER: No f/u scheduled now   SUBJECTIVE:  SUBJECTIVE STATEMENT:  Pt is a 43 year old female with primary complaint of R hip pain; referring diagnosis of R hip OA.   PERTINENT HISTORY: Pt is a 43 year old female with primary complaint of R hip pain; referring diagnosis of R hip OA.   She had an ultrasound-guided cortisone injection in 2023 that only helped slightly. She has been doing activity modification and stretching. She is requesting an order for physical therapy for more stretching and strengthening involving her legs. She is concerned about compensating to the left side. She is still working and is quite active. She stays away from high-impact activities.   Pt reports that pain began with change in work duties. Pt reports symptoms began with completion of butterfly stretch at night and waking up with notable pain affecting R side. Pt reports continuing with walking regimen - refraining from high impact activity.   History of popping in R  hip historically; some remote history of catching/clicking. Pt reports relief of symptoms with Meloxicam and rest. Difficulty with car transfer into mid-size SUV.    PAIN:    Pain Intensity: Present: 0/10, Best: 0/10, Worst: 7/10 Pain location: Anterior hip/groin pain  Pain Quality: burning , sharp with bending toward affected side  Radiating: Yes; pain in R knee also when pushing too far  Numbness/Tingling: No History of buckling: LE can buckle to closed-chain flexion too far  Focal Weakness: No Aggravating factors: trunk/hip flexion, forward bending, bending toward affected hip, high volume of stairs, car transfer Relieving factors: Meloxicam, rest, walking, stretches, hip flexor/lower abdominal exercises 24-hour pain behavior: PM after sitting too long  How long can you sit: How long can you stand: History of prior back injury, pain, surgery, or therapy: Yes; hx of cortisone injection without benefit  Imaging: Yes (see below)  Xrays revealed the right hip with mild femoral head irregularity with inferior femoral neck osteophyte formation. There is no real degenerative joint space narrowing as compared to the left.   Red flags: Negative for bowel/bladder changes, saddle paresthesia, personal history of cancer, h/o spinal tumors, h/o compression fx, h/o abdominal aneurysm, abdominal pain, chills/fever, night sweats, nausea, vomiting, unrelenting pain, first onset of insidious LBP <20 y/o  PRECAUTIONS: None  WEIGHT BEARING RESTRICTIONS: No  FALLS: Has patient fallen in last 6 months? 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  Recent memory is intact.  Remote memory is intact.  Attention span and concentration are intact.  Expressive speech is intact.  Patient's fund of knowledge is within normal limits for educational level.    Gross Musculoskeletal Assessment Tremor: None Bulk: Normal Tone: Normal   GAIT: Distance walked: 30 ft Assistive device utilized: None Level of assistance: Complete Independence Comments: Mild pelvic drop R>L during stance phase, lower extremities externally rotated, increased cadence and decreased stride length   Posture: Lumbar lordosis: WNL Iliac crest height: Equal bilaterally Lumbar lateral shift: Negative  AROM AROM (Normal range in degrees) AROM  08/11/23  Lumbar   Flexion (65) 75% (pain with end-range trunk flexion)  Extension (30) 50% (pulling anterior hip)  Right  lateral flexion (25) WFL  Left lateral flexion (25) WFL  Right rotation (30) 75%  Left rotation (30) 75% (pain with end-range rotation to L)      Hip Right Left  Flexion (125) 90*   Extension (15)    Abduction (40) 30   Adduction     Internal Rotation (45) 32* 35  External Rotation (45) 45 (pain with return to neutral)* WNL      Knee    Flexion (135) WNL   Extension (0) WNL       (* = pain; Blank rows = not tested)  LE MMT: MMT (out of 5) Right 08/11/23 Left 08/11/23  Hip flexion 4-* 4  Hip extension 4+ 5-  Hip abduction (seated) 5 4+ sidelying 5  Hip adduction (seated) 5 5  Hip internal rotation    Hip external rotation    Knee flexion 5 5  Knee extension 5 5  Ankle dorsiflexion    Ankle plantarflexion    Ankle inversion    Ankle eversion    (* = pain; Blank rows = not tested)  Sensation Deferred  Reflexes Deferred  Muscle Length Thomas (hip flexors): R: Positive for mild 10 deg deficit  L: Not examined Ober: R: Not examined L: Not  examined  Palpation TTP R distal illiopsoas 1+ (Blank rows = not tested) Graded on 0-4 scale (0 = no pain, 1 = pain, 2 = pain with wincing/grimacing/flinching, 3 = pain with withdrawal, 4 = unwilling to allow palpation)  Passive Accessory Intervertebral Motion Pt denies reproduction of back pain with CPA L1-L5 and UPA bilaterally L1-L5. Generally, hypomobile throughout  Special Tests Lumbar Radiculopathy and Discogenic: Centralization and Peripheralization (SN 92, -LR 0.12): Not examined Slump (SN 83, -LR 0.32): R: Negative L: Negative SLR (SN 92, -LR 0.29): R: Positive L:  Negative  Lumbar Foraminal Stenosis: Lumbar quadrant (SN 70): R: Negative L: Negative  Hip: FABER (SN 81): R: Positive L: Negative Hip scour (SN 50): R: Positive L: Negative  Labrum: Anterior Labral Tear Test: Positive     TODAY'S TREATMENT: DATE: 08/25/2023   SUBJECTIVE STATEMENT:   Patient reports 2/10 pain this date. States she is able to get a little deeper in her squat.   Manual Therapy - for symptom modulation, soft tissue sensitivity and mobility, joint mobility, ROM   Mulligan belt hip mobilization:   -Lateral distraction   -Long axis distraction  Therapeutic Exercise - for improved soft tissue flexibility and extensibility as needed for ROM, improved strength as needed to improve performance of CKC activities/functional movements  Nustep level 3 x 7 minutes - BLE only -- patient reporting discomfort after minute 5 - seat #7  Modified Thomas stretch 2 x 30 seconds  Prone Quad stretch; 3 x 30 sec,   3-way kick; x8 ea dir on each LE  -standing adjacent to raised treatment table for light unilateral UE support  TRX squats (to tolerance) 2 x 15   Knee flexion stretch on 2nd step 2 x 30 seconds on R   Kneeling hip flexor stretch 2 x 30 seconds on R   PATIENT EDUCATION: discussed use of prone quad stretch for positive Ely's test today.    *not today* Quadruped rockback; x 5 with hip  neutral, x 10 wit hip slightly externally rotated (< 30 deg); 5 sec hold    PATIENT EDUCATION:  Education details: see above for patient education details Person educated: Patient Education method: Explanation, Demonstration, and Handouts Education comprehension: verbalized understanding and  returned demonstration   HOME EXERCISE PROGRAM:  Access Code: [email or web address] URL: [email or web address] Date: 08/13/2023 Prepared by: Venetia Endo  Exercises - Half Kneeling Hip Flexor Stretch  - 2 x daily - 7 x weekly - 3 sets - 30sec hold - Supine Bridge  - 2 x daily - 7 x weekly - 2 sets - 10 reps - Clamshell  - 2 x daily - 7 x weekly - 2 sets - 10 reps - Quadruped Rocking Backward  - 2 x daily - 7 x weekly - 2 sets - 10 reps - Supine Hip Flexion with Resistance Loop  - 1 x daily - 7 x weekly - 2 sets - 10 reps   ASSESSMENT:  CLINICAL IMPRESSION:   Patient arrives to treatment session motivated to participate. Introduced use of Nustep within comfortable range with good tolerance. Session focused on stretching of hip flexors and squat depth with use of TRX straps. Tolerated session well with no increase in pain. Pt has remaining deficits in: bending, reaching to ground, car transfers, higher volume of stair negotiation, R hip stiffness and motion loss, hip flexor/psoas tightness, decreased hip flexor strength and minimal hip extensor weakness, taut/tender distal iliopsoas, and gait changes (see above). Pt will continue to benefit from skilled PT services to address deficits and improve function.   OBJECTIVE IMPAIRMENTS: Abnormal gait, decreased mobility, difficulty walking, decreased ROM, decreased strength, hypomobility, impaired flexibility, and pain.   ACTIVITY LIMITATIONS: bending, sitting, squatting, sleeping, stairs, transfers, and bed mobility  PARTICIPATION LIMITATIONS: driving, community activity, and occupation  PERSONAL FACTORS: Past/current experiences, Time since  onset of injury/illness/exacerbation, and 1-2 comorbidities: (HTN, depression) are also affecting patient's functional outcome.   REHAB POTENTIAL: Good  CLINICAL DECISION MAKING: Evolving/moderate complexity  EVALUATION COMPLEXITY: Moderate   GOALS: Goals reviewed with patient? Yes  SHORT TERM GOALS: Target date: 09/01/2023  Pt will be independent with HEP in order to improve strength and decrease back pain to improve pain-free function at home and work. Baseline: 08/11/23: Baseline HEP initiated.  Goal status: INITIAL   LONG TERM GOALS: Target date: 09/24/2023  Pt will have R hip flexion to 120 deg or greater and ABD to 40 deg or greater without reproduction of groin pain as needed for bed mobility tasks, ability to complete bending lower to ground, and intermittently for self-care ADLs Baseline: 08/11/23: Motion loss with R hip flexion, ER, IR, ABD. Goal status: INITIAL  2.  Pt will decrease worst hip pain by at least 3 points on the NPRS in order to demonstrate clinically significant reduction in back pain. Baseline: 08/11/23: 7/10 at worst Goal status: INITIAL  3.  Pt will improve LEFS by 10 points or greater indicative of clinically meaningful change in pt function   Baseline: 08/11/23: 61/80 Goal status: INITIAL  4.  Patient will negotiate full flight of steps with reciprocal pattern and no significant buckling or movement deviations without reproduction of hip pain.  Baseline: 08/11/23: limited with higher volume of stair climbing.  Goal status: INITIAL  5.  Patient will tolerate full trunk flexion in standing without reproduction of groin pain as needed for reaching to ground and intermittently for self-care ADLs Baseline: 08/11/23: Limited trunk flexion with groin pain.  Goal status: INITIAL   PLAN: PT FREQUENCY: 1-2x/week  PT DURATION: 6 weeks  PLANNED INTERVENTIONS: Therapeutic exercises, Therapeutic activity, Neuromuscular re-education, Balance training, Gait  training, Patient/Family education, Self Care, Joint mobilization, Joint manipulation, Vestibular training, Canalith repositioning, Orthotic/Fit training, DME instructions, Dry Needling, Electrical stimulation, Spinal  manipulation, Spinal mobilization, Cryotherapy, Moist heat, Taping, Traction, Ultrasound, Ionotophoresis 4mg /ml Dexamethasone , Manual therapy, and Re-evaluation.  PLAN FOR NEXT SESSION: Mulligan belt techniques to improve tolerance to flexion and ER/IR and for symptom modulation. Psoas stretching. Progressive hip flexor strengthening and hip stabilization.    Venetia Endo, PT, DPT [phone]  Physical Therapist - [facility] 08/25/2023, 8:08 AM

## 2023-08-27 ENCOUNTER — Ambulatory Visit: Admitting: Physical Therapy

## 2023-08-27 DIAGNOSIS — M6281 Muscle weakness (generalized): Secondary | ICD-10-CM | POA: Diagnosis not present

## 2023-08-27 DIAGNOSIS — R262 Difficulty in walking, not elsewhere classified: Secondary | ICD-10-CM

## 2023-08-27 DIAGNOSIS — M25551 Pain in right hip: Secondary | ICD-10-CM

## 2023-08-27 NOTE — Therapy (Signed)
 OUTPATIENT PHYSICAL THERAPY TREATMENT   Patient Name: Denise Bolton MRN: [id number] DOB:September 09, 1980, 43 y.o., female Today's Date: 08/27/2023  END OF SESSION:  PT End of Session - 08/27/23 1601     Visit Number 5    Number of Visits 13    Date for PT Re-Evaluation 09/24/23    PT Start Time 1601    PT Stop Time 1642    PT Time Calculation (min) 41 min    Activity Tolerance Patient limited by pain;Patient tolerated treatment well    Behavior During Therapy [facility] for tasks assessed/performed              Past Medical History:  Diagnosis Date   Anemia    prior to hysterectomy   BRCA negative 10/2014   MyRisk neg; IBIS=11.1%   Depression    Family history of ovarian cancer    MOTHER   Genetic testing of female 10/2014   IBIS=11.1%   GERD (gastroesophageal reflux disease)    Headache    migraines   Hypertension    h/o-came off meds per md instruction and changed lifestyle/diet and bp now controlled with no meds   Menorrhagia    Past Surgical History:  Procedure Laterality Date   ABDOMINAL HYSTERECTOMY     ABDOMINOPLASTY     ABDOMINOPLASTY  2007   FATTY TISSUE REMOVED   AUGMENTATION MAMMAPLASTY Bilateral 2007   BREAST BIOPSY Right 01/07/2022   stereo bx, ribbon marker, path pending   BREAST BIOPSY Right 01/07/2022   MM RT BREAST BX W LOC DEV 1ST LESION IMAGE BX SPEC STEREO GUIDE 01/07/2022 [facility]   BREAST BIOPSY Right 02/05/2022   MM RT RADIO FREQUENCY TAG LOC MAMMO GUIDE 02/05/2022 [facility]   BREAST LUMPECTOMY WITH RADIO FREQUENCY LOCALIZER Right 02/06/2022   Procedure: BREAST LUMPECTOMY WITH RADIO FREQUENCY LOCALIZER;  Surgeon: Jordis Laneta FALCON, MD;  Location: [facility];  Service: General;  Laterality: Right;   BREAST SURGERY     augmentation   CESAREAN SECTION     x 2   CYSTOSCOPY  01/11/2015   Procedure: CYSTOSCOPY;  Surgeon: Glory High, MD;  Location: [facility];  Service: Gynecology;;   LAPAROSCOPIC BILATERAL SALPINGECTOMY Bilateral  01/11/2015   Procedure: LAPAROSCOPIC BILATERAL SALPINGECTOMY;  Surgeon: Glory High, MD;  Location: [facility];  Service: Gynecology;  Laterality: Bilateral;   LAPAROSCOPIC HYSTERECTOMY N/A 01/11/2015   Procedure: HYSTERECTOMY TOTAL LAPAROSCOPIC;  Surgeon: Glory High, MD;  Location: [facility];  Service: Gynecology;  Laterality: N/A;   Patient Active Problem List   Diagnosis Date Noted   Family history of ovarian cancer 03/18/2022   Atypical lobular hyperplasia [facility]) of right breast 02/06/2022   Annual physical exam 04/08/2019   Recurrent major depressive disorder, in partial remission (HCC) 06/22/2017   S/P laparoscopic hysterectomy 01/11/2015   Iron deficiency anemia 08/22/2014   Palpitations 07/11/2013   Preeclampsia 07/11/2013    PCP: Joshua Cathryne BROCKS, MD (Inactive)  REFERRING PROVIDER: Verlinda Boas, PA-C  REFERRING DIAG: F74.448 (ICD-10-CM) - Pain of right hip   RATIONALE FOR EVALUATION AND TREATMENT: Rehabilitation  THERAPY DIAG: Pain in right hip  Difficulty in walking, not elsewhere classified  Muscle weakness (generalized)  ONSET DATE: Jan 2023  FOLLOW-UP APPT SCHEDULED WITH REFERRING PROVIDER: No f/u scheduled now   SUBJECTIVE:  SUBJECTIVE STATEMENT:  Pt is a 43 year old female with primary complaint of R hip pain; referring diagnosis of R hip OA.   PERTINENT HISTORY: Pt is a 43 year old female with primary complaint of R hip pain; referring diagnosis of R hip OA.   She had an ultrasound-guided cortisone injection in 2023 that only helped slightly. She has been doing activity modification and stretching. She is requesting an order for physical therapy for more stretching and strengthening involving her legs. She is concerned about compensating to the left side. She is still  working and is quite active. She stays away from high-impact activities.   Pt reports that pain began with change in work duties. Pt reports symptoms began with completion of butterfly stretch at night and waking up with notable pain affecting R side. Pt reports continuing with walking regimen - refraining from high impact activity.   History of popping in R hip historically; some remote history of catching/clicking. Pt reports relief of symptoms with Meloxicam and rest. Difficulty with car transfer into mid-size SUV.    PAIN:    Pain Intensity: Present: 0/10, Best: 0/10, Worst: 7/10 Pain location: Anterior hip/groin pain  Pain Quality: burning , sharp with bending toward affected side  Radiating: Yes; pain in R knee also when pushing too far  Numbness/Tingling: No History of buckling: LE can buckle to closed-chain flexion too far  Focal Weakness: No Aggravating factors: trunk/hip flexion, forward bending, bending toward affected hip, high volume of stairs, car transfer Relieving factors: Meloxicam, rest, walking, stretches, hip flexor/lower abdominal exercises 24-hour pain behavior: PM after sitting too long  How long can you sit: How long can you stand: History of prior back injury, pain, surgery, or therapy: Yes; hx of cortisone injection without benefit  Imaging: Yes (see below)  Xrays revealed the right hip with mild femoral head irregularity with inferior femoral neck osteophyte formation. There is no real degenerative joint space narrowing as compared to the left.   Red flags: Negative for bowel/bladder changes, saddle paresthesia, personal history of cancer, h/o spinal tumors, h/o compression fx, h/o abdominal aneurysm, abdominal pain, chills/fever, night sweats, nausea, vomiting, unrelenting pain, first onset of insidious LBP <20 y/o  PRECAUTIONS: None  WEIGHT BEARING RESTRICTIONS: No  FALLS: Has patient fallen in last 6 months? 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  Recent memory is intact.  Remote memory is intact.  Attention span and concentration are intact.  Expressive speech is intact.  Patient's fund of knowledge is within normal limits for educational level.    Gross Musculoskeletal Assessment Tremor: None Bulk: Normal Tone: Normal   GAIT: Distance walked: 30 ft Assistive device utilized: None Level of assistance: Complete Independence Comments: Mild pelvic drop R>L during stance phase, lower extremities externally rotated, increased cadence and decreased stride length   Posture: Lumbar lordosis: WNL Iliac crest height: Equal bilaterally Lumbar lateral shift: Negative  AROM AROM (Normal range in degrees) AROM  08/11/23  Lumbar   Flexion (65) 75% (pain with end-range trunk flexion)  Extension (30) 50% (pulling anterior hip)  Right  lateral flexion (25) WFL  Left lateral flexion (25) WFL  Right rotation (30) 75%  Left rotation (30) 75% (pain with end-range rotation to L)      Hip Right Left  Flexion (125) 90*   Extension (15)    Abduction (40) 30   Adduction     Internal Rotation (45) 32* 35  External Rotation (45) 45 (pain with return to neutral)* WNL      Knee    Flexion (135) WNL   Extension (0) WNL       (* = pain; Blank rows = not tested)  LE MMT: MMT (out of 5) Right 08/11/23 Left 08/11/23  Hip flexion 4-* 4  Hip extension 4+ 5-  Hip abduction (seated) 5 4+ sidelying 5  Hip adduction (seated) 5 5  Hip internal rotation    Hip external rotation    Knee  flexion 5 5  Knee extension 5 5  Ankle dorsiflexion    Ankle plantarflexion    Ankle inversion    Ankle eversion    (* = pain; Blank rows = not tested)  Sensation Deferred  Reflexes Deferred  Muscle Length Thomas (hip flexors): R: Positive for mild 10 deg deficit  L: Not examined Ober: R: Not examined L: Not examined  Palpation TTP R distal illiopsoas 1+ (Blank rows = not tested) Graded on 0-4 scale (0 = no pain, 1 = pain, 2 = pain with wincing/grimacing/flinching, 3 = pain with withdrawal, 4 = unwilling to allow palpation)  Passive Accessory Intervertebral Motion Pt denies reproduction of back pain with CPA L1-L5 and UPA bilaterally L1-L5. Generally, hypomobile throughout  Special Tests Lumbar Radiculopathy and Discogenic: Centralization and Peripheralization (SN 92, -LR 0.12): Not examined Slump (SN 83, -LR 0.32): R: Negative L: Negative SLR (SN 92, -LR 0.29): R: Positive L:  Negative  Lumbar Foraminal Stenosis: Lumbar quadrant (SN 70): R: Negative L: Negative  Hip: FABER (SN 81): R: Positive L: Negative Hip scour (SN 50): R: Positive L: Negative  Labrum: Anterior Labral Tear Test: Positive     TODAY'S TREATMENT: DATE: 08/27/2023   SUBJECTIVE STATEMENT:   Patient reports pain with prone quad stretching last visit, but she states that it helped notably after the fact. Pt repots tolerating clamshells better with pillow in between knees to limit IR. Patient reports minimal pain at arrival to PT; pt did not go to gym today. Pt reports she does feel notable R anterior hip pain after prolonged walking.    Manual Therapy - for symptom modulation, soft tissue sensitivity and mobility, joint mobility, ROM   Mulligan belt hip mobilization:   -Lateral distraction   -Long axis distraction L hip PROM within pt tolerance; x 1 min  Therapeutic Exercise - for improved soft tissue flexibility and extensibility as needed for ROM, improved strength as needed to improve  performance of CKC activities/functional movements   Modified Thomas stretch 2 x 30 seconds  Prone Quad stretch, manual; x 60 sec  Kneeling hip flexor stretch; 2 x 30 seconds on R   3-way kick; x8 ea dir on each LE  -standing adjacent to raised treatment table for light unilateral UE support  TRX squats (to tolerance) 2 x 15   TRX alternating lateral lunge (depth to tolerance); 2 x 10 alternating R/L     PATIENT EDUCATION:  HEP review.   *not today* Nustep level 3 x 7 minutes - BLE only -- patient reporting discomfort after minute 5 - seat #7 Knee flexion stretch on  2nd step;  3 x 30 seconds on R  Quadruped rockback; x 5 with hip neutral, x 10 wit hip slightly externally rotated (< 30 deg); 5 sec hold    PATIENT EDUCATION:  Education details: see above for patient education details Person educated: Patient Education method: Explanation, Demonstration, and Handouts Education comprehension: verbalized understanding and returned demonstration   HOME EXERCISE PROGRAM:  Access Code: [email or web address] URL: [email or web address] Date: 08/13/2023 Prepared by: Venetia Endo  Exercises - Half Kneeling Hip Flexor Stretch  - 2 x daily - 7 x weekly - 3 sets - 30sec hold - Supine Bridge  - 2 x daily - 7 x weekly - 2 sets - 10 reps - Clamshell  - 2 x daily - 7 x weekly - 2 sets - 10 reps - Quadruped Rocking Backward  - 2 x daily - 7 x weekly - 2 sets - 10 reps - Supine Hip Flexion with Resistance Loop  - 1 x daily - 7 x weekly - 2 sets - 10 reps   ASSESSMENT:  CLINICAL IMPRESSION:   Patient arrives to treatment session motivated to participate. Introduced use of Nustep within comfortable range with good tolerance. Session focused on stretching of hip flexors and squat depth with use of TRX straps. Tolerated session well with no increase in pain. Pt has remaining deficits in: bending, reaching to ground, car transfers, higher volume of stair negotiation, R hip stiffness  and motion loss, hip flexor/psoas tightness, decreased hip flexor strength and minimal hip extensor weakness, taut/tender distal iliopsoas, and gait changes (see above). Pt will continue to benefit from skilled PT services to address deficits and improve function.   OBJECTIVE IMPAIRMENTS: Abnormal gait, decreased mobility, difficulty walking, decreased ROM, decreased strength, hypomobility, impaired flexibility, and pain.   ACTIVITY LIMITATIONS: bending, sitting, squatting, sleeping, stairs, transfers, and bed mobility  PARTICIPATION LIMITATIONS: driving, community activity, and occupation  PERSONAL FACTORS: Past/current experiences, Time since onset of injury/illness/exacerbation, and 1-2 comorbidities: (HTN, depression) are also affecting patient's functional outcome.   REHAB POTENTIAL: Good  CLINICAL DECISION MAKING: Evolving/moderate complexity  EVALUATION COMPLEXITY: Moderate   GOALS: Goals reviewed with patient? Yes  SHORT TERM GOALS: Target date: 09/01/2023  Pt will be independent with HEP in order to improve strength and decrease back pain to improve pain-free function at home and work. Baseline: 08/11/23: Baseline HEP initiated.  Goal status: INITIAL   LONG TERM GOALS: Target date: 09/24/2023  Pt will have R hip flexion to 120 deg or greater and ABD to 40 deg or greater without reproduction of groin pain as needed for bed mobility tasks, ability to complete bending lower to ground, and intermittently for self-care ADLs Baseline: 08/11/23: Motion loss with R hip flexion, ER, IR, ABD. Goal status: INITIAL  2.  Pt will decrease worst hip pain by at least 3 points on the NPRS in order to demonstrate clinically significant reduction in back pain. Baseline: 08/11/23: 7/10 at worst Goal status: INITIAL  3.  Pt will improve LEFS by 10 points or greater indicative of clinically meaningful change in pt function   Baseline: 08/11/23: 61/80 Goal status: INITIAL  4.  Patient will  negotiate full flight of steps with reciprocal pattern and no significant buckling or movement deviations without reproduction of hip pain.  Baseline: 08/11/23: limited with higher volume of stair climbing.  Goal status: INITIAL  5.  Patient will tolerate full trunk flexion in standing without reproduction of groin pain as needed for reaching to ground and intermittently for self-care  ADLs Baseline: 08/11/23: Limited trunk flexion with groin pain.  Goal status: INITIAL   PLAN: PT FREQUENCY: 1-2x/week  PT DURATION: 6 weeks  PLANNED INTERVENTIONS: Therapeutic exercises, Therapeutic activity, Neuromuscular re-education, Balance training, Gait training, Patient/Family education, Self Care, Joint mobilization, Joint manipulation, Vestibular training, Canalith repositioning, Orthotic/Fit training, DME instructions, Dry Needling, Electrical stimulation, Spinal manipulation, Spinal mobilization, Cryotherapy, Moist heat, Taping, Traction, Ultrasound, Ionotophoresis 4mg /ml Dexamethasone , Manual therapy, and Re-evaluation.  PLAN FOR NEXT SESSION: Mulligan belt techniques to improve tolerance to flexion and ER/IR and for symptom modulation. Psoas stretching. Progressive hip flexor strengthening and hip stabilization.    Venetia Endo, PT, DPT [phone]  Physical Therapist - [facility] 08/27/2023, 4:02 PM

## 2023-08-31 ENCOUNTER — Encounter: Payer: Self-pay | Admitting: Physical Therapy

## 2023-09-01 ENCOUNTER — Ambulatory Visit: Attending: Orthopedic Surgery | Admitting: Physical Therapy

## 2023-09-01 DIAGNOSIS — M25551 Pain in right hip: Secondary | ICD-10-CM | POA: Insufficient documentation

## 2023-09-01 DIAGNOSIS — M6281 Muscle weakness (generalized): Secondary | ICD-10-CM | POA: Diagnosis not present

## 2023-09-01 DIAGNOSIS — R262 Difficulty in walking, not elsewhere classified: Secondary | ICD-10-CM | POA: Diagnosis not present

## 2023-09-01 NOTE — Therapy (Unsigned)
 OUTPATIENT PHYSICAL THERAPY TREATMENT   Patient Name: Denise Bolton MRN: [id number] DOB:02-12-1981, 43 y.o., female Today's Date: 09/01/2023  END OF SESSION:  PT End of Session - 09/01/23 1604     Visit Number 6    Number of Visits 13    Date for PT Re-Evaluation 09/24/23    PT Start Time 1604    PT Stop Time 1645    PT Time Calculation (min) 41 min    Activity Tolerance Patient limited by pain;Patient tolerated treatment well    Behavior During Therapy [facility] for tasks assessed/performed               Past Medical History:  Diagnosis Date   Anemia    prior to hysterectomy   BRCA negative 10/2014   MyRisk neg; IBIS=11.1%   Depression    Family history of ovarian cancer    MOTHER   Genetic testing of female 10/2014   IBIS=11.1%   GERD (gastroesophageal reflux disease)    Headache    migraines   Hypertension    h/o-came off meds per md instruction and changed lifestyle/diet and bp now controlled with no meds   Menorrhagia    Past Surgical History:  Procedure Laterality Date   ABDOMINAL HYSTERECTOMY     ABDOMINOPLASTY     ABDOMINOPLASTY  2007   FATTY TISSUE REMOVED   AUGMENTATION MAMMAPLASTY Bilateral 2007   BREAST BIOPSY Right 01/07/2022   stereo bx, ribbon marker, path pending   BREAST BIOPSY Right 01/07/2022   MM RT BREAST BX W LOC DEV 1ST LESION IMAGE BX SPEC STEREO GUIDE 01/07/2022 [facility]   BREAST BIOPSY Right 02/05/2022   MM RT RADIO FREQUENCY TAG LOC MAMMO GUIDE 02/05/2022 [facility]   BREAST LUMPECTOMY WITH RADIO FREQUENCY LOCALIZER Right 02/06/2022   Procedure: BREAST LUMPECTOMY WITH RADIO FREQUENCY LOCALIZER;  Surgeon: Jordis Laneta FALCON, MD;  Location: [facility];  Service: General;  Laterality: Right;   BREAST SURGERY     augmentation   CESAREAN SECTION     x 2   CYSTOSCOPY  01/11/2015   Procedure: CYSTOSCOPY;  Surgeon: Glory High, MD;  Location: [facility];  Service: Gynecology;;   LAPAROSCOPIC BILATERAL SALPINGECTOMY Bilateral  01/11/2015   Procedure: LAPAROSCOPIC BILATERAL SALPINGECTOMY;  Surgeon: Glory High, MD;  Location: [facility];  Service: Gynecology;  Laterality: Bilateral;   LAPAROSCOPIC HYSTERECTOMY N/A 01/11/2015   Procedure: HYSTERECTOMY TOTAL LAPAROSCOPIC;  Surgeon: Glory High, MD;  Location: [facility];  Service: Gynecology;  Laterality: N/A;   Patient Active Problem List   Diagnosis Date Noted   Family history of ovarian cancer 03/18/2022   Atypical lobular hyperplasia [facility]) of right breast 02/06/2022   Annual physical exam 04/08/2019   Recurrent major depressive disorder, in partial remission (HCC) 06/22/2017   S/P laparoscopic hysterectomy 01/11/2015   Iron deficiency anemia 08/22/2014   Palpitations 07/11/2013   Preeclampsia 07/11/2013    PCP: Joshua Cathryne BROCKS, MD (Inactive)  REFERRING PROVIDER: Verlinda Boas, PA-C  REFERRING DIAG: F74.448 (ICD-10-CM) - Pain of right hip   RATIONALE FOR EVALUATION AND TREATMENT: Rehabilitation  THERAPY DIAG: Pain in right hip  Difficulty in walking, not elsewhere classified  Muscle weakness (generalized)  ONSET DATE: Jan 2023  FOLLOW-UP APPT SCHEDULED WITH REFERRING PROVIDER: No f/u scheduled now   SUBJECTIVE:  SUBJECTIVE STATEMENT:  Pt is a 43 year old female with primary complaint of R hip pain; referring diagnosis of R hip OA.   PERTINENT HISTORY: Pt is a 43 year old female with primary complaint of R hip pain; referring diagnosis of R hip OA.   She had an ultrasound-guided cortisone injection in 2023 that only helped slightly. She has been doing activity modification and stretching. She is requesting an order for physical therapy for more stretching and strengthening involving her legs. She is concerned about compensating to the left side. She is still  working and is quite active. She stays away from high-impact activities.   Pt reports that pain began with change in work duties. Pt reports symptoms began with completion of butterfly stretch at night and waking up with notable pain affecting R side. Pt reports continuing with walking regimen - refraining from high impact activity.   History of popping in R hip historically; some remote history of catching/clicking. Pt reports relief of symptoms with Meloxicam and rest. Difficulty with car transfer into mid-size SUV.    PAIN:    Pain Intensity: Present: 0/10, Best: 0/10, Worst: 7/10 Pain location: Anterior hip/groin pain  Pain Quality: burning , sharp with bending toward affected side  Radiating: Yes; pain in R knee also when pushing too far  Numbness/Tingling: No History of buckling: LE can buckle to closed-chain flexion too far  Focal Weakness: No Aggravating factors: trunk/hip flexion, forward bending, bending toward affected hip, high volume of stairs, car transfer Relieving factors: Meloxicam, rest, walking, stretches, hip flexor/lower abdominal exercises 24-hour pain behavior: PM after sitting too long  How long can you sit: How long can you stand: History of prior back injury, pain, surgery, or therapy: Yes; hx of cortisone injection without benefit  Imaging: Yes (see below)  Xrays revealed the right hip with mild femoral head irregularity with inferior femoral neck osteophyte formation. There is no real degenerative joint space narrowing as compared to the left.   Red flags: Negative for bowel/bladder changes, saddle paresthesia, personal history of cancer, h/o spinal tumors, h/o compression fx, h/o abdominal aneurysm, abdominal pain, chills/fever, night sweats, nausea, vomiting, unrelenting pain, first onset of insidious LBP <20 y/o  PRECAUTIONS: None  WEIGHT BEARING RESTRICTIONS: No  FALLS: Has patient fallen in last 6 months? 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  Recent memory is intact.  Remote memory is intact.  Attention span and concentration are intact.  Expressive speech is intact.  Patient's fund of knowledge is within normal limits for educational level.    Gross Musculoskeletal Assessment Tremor: None Bulk: Normal Tone: Normal   GAIT: Distance walked: 30 ft Assistive device utilized: None Level of assistance: Complete Independence Comments: Mild pelvic drop R>L during stance phase, lower extremities externally rotated, increased cadence and decreased stride length   Posture: Lumbar lordosis: WNL Iliac crest height: Equal bilaterally Lumbar lateral shift: Negative  AROM AROM (Normal range in degrees) AROM  08/11/23  Lumbar   Flexion (65) 75% (pain with end-range trunk flexion)  Extension (30) 50% (pulling anterior hip)  Right  lateral flexion (25) WFL  Left lateral flexion (25) WFL  Right rotation (30) 75%  Left rotation (30) 75% (pain with end-range rotation to L)      Hip Right Left  Flexion (125) 90*   Extension (15)    Abduction (40) 30   Adduction     Internal Rotation (45) 32* 35  External Rotation (45) 45 (pain with return to neutral)* WNL      Knee    Flexion (135) WNL   Extension (0) WNL       (* = pain; Blank rows = not tested)  LE MMT: MMT (out of 5) Right 08/11/23 Left 08/11/23  Hip flexion 4-* 4  Hip extension 4+ 5-  Hip abduction (seated) 5 4+ sidelying 5  Hip adduction (seated) 5 5  Hip internal rotation    Hip external rotation    Knee  flexion 5 5  Knee extension 5 5  Ankle dorsiflexion    Ankle plantarflexion    Ankle inversion    Ankle eversion    (* = pain; Blank rows = not tested)  Sensation Deferred  Reflexes Deferred  Muscle Length Thomas (hip flexors): R: Positive for mild 10 deg deficit  L: Not examined Ober: R: Not examined L: Not examined  Palpation TTP R distal illiopsoas 1+ (Blank rows = not tested) Graded on 0-4 scale (0 = no pain, 1 = pain, 2 = pain with wincing/grimacing/flinching, 3 = pain with withdrawal, 4 = unwilling to allow palpation)  Passive Accessory Intervertebral Motion Pt denies reproduction of back pain with CPA L1-L5 and UPA bilaterally L1-L5. Generally, hypomobile throughout  Special Tests Lumbar Radiculopathy and Discogenic: Centralization and Peripheralization (SN 92, -LR 0.12): Not examined Slump (SN 83, -LR 0.32): R: Negative L: Negative SLR (SN 92, -LR 0.29): R: Positive L:  Negative  Lumbar Foraminal Stenosis: Lumbar quadrant (SN 70): R: Negative L: Negative  Hip: FABER (SN 81): R: Positive L: Negative Hip scour (SN 50): R: Positive L: Negative  Labrum: Anterior Labral Tear Test: Positive     TODAY'S TREATMENT: DATE: 09/01/2023   SUBJECTIVE STATEMENT:   Patient reports pain with prone quad stretching last visit, but she states that it helped notably after the fact. Pt reports tolerating clamshells better with pillow in between knees to limit IR. Patient reports minimal pain at arrival to PT; pt did not go to gym today. Pt reports she does feel notable R anterior hip pain after prolonged walking.    Manual Therapy - for symptom modulation, soft tissue sensitivity and mobility, joint mobility, ROM   Mulligan belt hip mobilization:   -Lateral distraction   -Long axis distraction with hip flexion and IR as tolerated L hip PROM within pt tolerance; x 1 min  Therapeutic Exercise - for improved soft tissue flexibility and extensibility as needed for ROM, improved  strength as needed to improve performance of CKC activities/functional movements   Modified Thomas stretch 2 x 30 seconds  Prone Quad stretch, manual; x 60 sec  Kneeling hip flexor stretch; 2 x 30 seconds on R   3-way kick; x8 ea dir on each LE  -standing adjacent to raised treatment table for light unilateral UE support  TRX squats (to tolerance) 2 x 15   TRX alternating lateral lunge (depth to tolerance); 2 x 10 alternating R/L  Single limb stance on Airex; 2 x 20 sec   PATIENT EDUCATION:  HEP review.   *not today* Nustep level 3 x 7 minutes - BLE  only -- patient reporting discomfort after minute 5 - seat #7 Knee flexion stretch on 2nd step;  3 x 30 seconds on R  Quadruped rockback; x 5 with hip neutral, x 10 wit hip slightly externally rotated (< 30 deg); 5 sec hold    PATIENT EDUCATION:  Education details: see above for patient education details Person educated: Patient Education method: Explanation, Demonstration, and Handouts Education comprehension: verbalized understanding and returned demonstration   HOME EXERCISE PROGRAM:  Access Code: [email or web address] URL: [email or web address] Date: 08/13/2023 Prepared by: Venetia Endo  Exercises - Half Kneeling Hip Flexor Stretch  - 2 x daily - 7 x weekly - 3 sets - 30sec hold - Supine Bridge  - 2 x daily - 7 x weekly - 2 sets - 10 reps - Clamshell  - 2 x daily - 7 x weekly - 2 sets - 10 reps - Quadruped Rocking Backward  - 2 x daily - 7 x weekly - 2 sets - 10 reps - Supine Hip Flexion with Resistance Loop  - 1 x daily - 7 x weekly - 2 sets - 10 reps   ASSESSMENT:  CLINICAL IMPRESSION:   Patient fortunately feels that quad stretching/recent intervention with prn therapist did help with anterior thigh and knee pain as well as anterior hip pain. We are able to gradually progress CKC loading within limited range for closed-chain hip flexion. Pt feels that she has made progress to date with PT, but she feels that  she may have continued chronic hip stiffness/pain that she has to manage. Pt has remaining deficits in: bending, reaching to ground, car transfers, higher volume of stair negotiation, R hip stiffness and motion loss, hip flexor/psoas tightness, decreased hip flexor strength and minimal hip extensor weakness, taut/tender distal iliopsoas, and gait changes (see above). Pt will continue to benefit from skilled PT services to address deficits and improve function.   OBJECTIVE IMPAIRMENTS: Abnormal gait, decreased mobility, difficulty walking, decreased ROM, decreased strength, hypomobility, impaired flexibility, and pain.   ACTIVITY LIMITATIONS: bending, sitting, squatting, sleeping, stairs, transfers, and bed mobility  PARTICIPATION LIMITATIONS: driving, community activity, and occupation  PERSONAL FACTORS: Past/current experiences, Time since onset of injury/illness/exacerbation, and 1-2 comorbidities: (HTN, depression) are also affecting patient's functional outcome.   REHAB POTENTIAL: Good  CLINICAL DECISION MAKING: Evolving/moderate complexity  EVALUATION COMPLEXITY: Moderate   GOALS: Goals reviewed with patient? Yes  SHORT TERM GOALS: Target date: 09/01/2023  Pt will be independent with HEP in order to improve strength and decrease back pain to improve pain-free function at home and work. Baseline: 08/11/23: Baseline HEP initiated.  Goal status: INITIAL   LONG TERM GOALS: Target date: 09/24/2023  Pt will have R hip flexion to 120 deg or greater and ABD to 40 deg or greater without reproduction of groin pain as needed for bed mobility tasks, ability to complete bending lower to ground, and intermittently for self-care ADLs Baseline: 08/11/23: Motion loss with R hip flexion, ER, IR, ABD. Goal status: INITIAL  2.  Pt will decrease worst hip pain by at least 3 points on the NPRS in order to demonstrate clinically significant reduction in back pain. Baseline: 08/11/23: 7/10 at worst Goal  status: INITIAL  3.  Pt will improve LEFS by 10 points or greater indicative of clinically meaningful change in pt function   Baseline: 08/11/23: 61/80 Goal status: INITIAL  4.  Patient will negotiate full flight of steps with reciprocal pattern and no significant buckling or movement deviations without reproduction of hip pain.  Baseline: 08/11/23: limited with higher volume of stair climbing.  Goal status: INITIAL  5.  Patient will tolerate full trunk flexion in standing without reproduction of groin pain as needed for reaching to ground and intermittently for self-care ADLs Baseline: 08/11/23: Limited trunk flexion with groin pain.  Goal status: INITIAL   PLAN: PT FREQUENCY: 1-2x/week  PT DURATION: 6 weeks  PLANNED INTERVENTIONS: Therapeutic exercises, Therapeutic activity, Neuromuscular re-education, Balance training, Gait training, Patient/Family education, Self Care, Joint mobilization, Joint manipulation, Vestibular training, Canalith repositioning, Orthotic/Fit training, DME instructions, Dry Needling, Electrical stimulation, Spinal manipulation, Spinal mobilization, Cryotherapy, Moist heat, Taping, Traction, Ultrasound, Ionotophoresis 4mg /ml Dexamethasone , Manual therapy, and Re-evaluation.  PLAN FOR NEXT SESSION: Mulligan belt techniques to improve tolerance to flexion and ER/IR and for symptom modulation. Psoas stretching. Progressive hip flexor strengthening and hip stabilization.    Venetia Endo, PT, DPT [phone]  Physical Therapist - [facility] 09/01/2023, 4:04 PM

## 2023-09-03 ENCOUNTER — Encounter: Admitting: Physical Therapy

## 2023-09-03 ENCOUNTER — Encounter: Payer: Self-pay | Admitting: Physical Therapy

## 2023-09-08 ENCOUNTER — Ambulatory Visit: Admitting: Physical Therapy

## 2023-09-08 DIAGNOSIS — M6281 Muscle weakness (generalized): Secondary | ICD-10-CM

## 2023-09-08 DIAGNOSIS — R262 Difficulty in walking, not elsewhere classified: Secondary | ICD-10-CM | POA: Diagnosis not present

## 2023-09-08 DIAGNOSIS — M25551 Pain in right hip: Secondary | ICD-10-CM | POA: Diagnosis not present

## 2023-09-08 NOTE — Therapy (Unsigned)
 OUTPATIENT PHYSICAL THERAPY TREATMENT   Patient Name: Denise Bolton MRN: [id number] DOB:1980/04/09, 43 y.o., female Today's Date: 09/08/2023  END OF SESSION:  PT End of Session - 09/08/23 1601     Visit Number 7    Number of Visits 13    Date for PT Re-Evaluation 09/24/23    PT Start Time 1601    PT Stop Time 1643    PT Time Calculation (min) 42 min    Activity Tolerance Patient limited by pain;Patient tolerated treatment well    Behavior During Therapy [facility] for tasks assessed/performed                Past Medical History:  Diagnosis Date   Anemia    prior to hysterectomy   BRCA negative 10/2014   MyRisk neg; IBIS=11.1%   Depression    Family history of ovarian cancer    MOTHER   Genetic testing of female 10/2014   IBIS=11.1%   GERD (gastroesophageal reflux disease)    Headache    migraines   Hypertension    h/o-came off meds per md instruction and changed lifestyle/diet and bp now controlled with no meds   Menorrhagia    Past Surgical History:  Procedure Laterality Date   ABDOMINAL HYSTERECTOMY     ABDOMINOPLASTY     ABDOMINOPLASTY  2007   FATTY TISSUE REMOVED   AUGMENTATION MAMMAPLASTY Bilateral 2007   BREAST BIOPSY Right 01/07/2022   stereo bx, ribbon marker, path pending   BREAST BIOPSY Right 01/07/2022   MM RT BREAST BX W LOC DEV 1ST LESION IMAGE BX SPEC STEREO GUIDE 01/07/2022 [facility]   BREAST BIOPSY Right 02/05/2022   MM RT RADIO FREQUENCY TAG LOC MAMMO GUIDE 02/05/2022 [facility]   BREAST LUMPECTOMY WITH RADIO FREQUENCY LOCALIZER Right 02/06/2022   Procedure: BREAST LUMPECTOMY WITH RADIO FREQUENCY LOCALIZER;  Surgeon: Jordis Laneta FALCON, MD;  Location: [facility];  Service: General;  Laterality: Right;   BREAST SURGERY     augmentation   CESAREAN SECTION     x 2   CYSTOSCOPY  01/11/2015   Procedure: CYSTOSCOPY;  Surgeon: Glory High, MD;  Location: [facility];  Service: Gynecology;;   LAPAROSCOPIC BILATERAL SALPINGECTOMY Bilateral  01/11/2015   Procedure: LAPAROSCOPIC BILATERAL SALPINGECTOMY;  Surgeon: Glory High, MD;  Location: [facility];  Service: Gynecology;  Laterality: Bilateral;   LAPAROSCOPIC HYSTERECTOMY N/A 01/11/2015   Procedure: HYSTERECTOMY TOTAL LAPAROSCOPIC;  Surgeon: Glory High, MD;  Location: [facility];  Service: Gynecology;  Laterality: N/A;   Patient Active Problem List   Diagnosis Date Noted   Family history of ovarian cancer 03/18/2022   Atypical lobular hyperplasia [facility]) of right breast 02/06/2022   Annual physical exam 04/08/2019   Recurrent major depressive disorder, in partial remission (HCC) 06/22/2017   S/P laparoscopic hysterectomy 01/11/2015   Iron deficiency anemia 08/22/2014   Palpitations 07/11/2013   Preeclampsia 07/11/2013    PCP: Joshua Cathryne BROCKS, MD (Inactive)  REFERRING PROVIDER: Verlinda Boas, PA-C  REFERRING DIAG: F74.448 (ICD-10-CM) - Pain of right hip   RATIONALE FOR EVALUATION AND TREATMENT: Rehabilitation  THERAPY DIAG: Pain in right hip  Difficulty in walking, not elsewhere classified  Muscle weakness (generalized)  ONSET DATE: Jan 2023  FOLLOW-UP APPT SCHEDULED WITH REFERRING PROVIDER: No f/u scheduled now   SUBJECTIVE:  SUBJECTIVE STATEMENT:  Pt is a 43 year old female with primary complaint of R hip pain; referring diagnosis of R hip OA.   PERTINENT HISTORY: Pt is a 43 year old female with primary complaint of R hip pain; referring diagnosis of R hip OA.   She had an ultrasound-guided cortisone injection in 2023 that only helped slightly. She has been doing activity modification and stretching. She is requesting an order for physical therapy for more stretching and strengthening involving her legs. She is concerned about compensating to the left side. She is still  working and is quite active. She stays away from high-impact activities.   Pt reports that pain began with change in work duties. Pt reports symptoms began with completion of butterfly stretch at night and waking up with notable pain affecting R side. Pt reports continuing with walking regimen - refraining from high impact activity.   History of popping in R hip historically; some remote history of catching/clicking. Pt reports relief of symptoms with Meloxicam and rest. Difficulty with car transfer into mid-size SUV.    PAIN:    Pain Intensity: Present: 0/10, Best: 0/10, Worst: 7/10 Pain location: Anterior hip/groin pain  Pain Quality: burning , sharp with bending toward affected side  Radiating: Yes; pain in R knee also when pushing too far  Numbness/Tingling: No History of buckling: LE can buckle to closed-chain flexion too far  Focal Weakness: No Aggravating factors: trunk/hip flexion, forward bending, bending toward affected hip, high volume of stairs, car transfer Relieving factors: Meloxicam, rest, walking, stretches, hip flexor/lower abdominal exercises 24-hour pain behavior: PM after sitting too long  How long can you sit: How long can you stand: History of prior back injury, pain, surgery, or therapy: Yes; hx of cortisone injection without benefit  Imaging: Yes (see below)  Xrays revealed the right hip with mild femoral head irregularity with inferior femoral neck osteophyte formation. There is no real degenerative joint space narrowing as compared to the left.   Red flags: Negative for bowel/bladder changes, saddle paresthesia, personal history of cancer, h/o spinal tumors, h/o compression fx, h/o abdominal aneurysm, abdominal pain, chills/fever, night sweats, nausea, vomiting, unrelenting pain, first onset of insidious LBP <20 y/o  PRECAUTIONS: None  WEIGHT BEARING RESTRICTIONS: No  FALLS: Has patient fallen in last 6 months? 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  Recent memory is intact.  Remote memory is intact.  Attention span and concentration are intact.  Expressive speech is intact.  Patient's fund of knowledge is within normal limits for educational level.    Gross Musculoskeletal Assessment Tremor: None Bulk: Normal Tone: Normal   GAIT: Distance walked: 30 ft Assistive device utilized: None Level of assistance: Complete Independence Comments: Mild pelvic drop R>L during stance phase, lower extremities externally rotated, increased cadence and decreased stride length   Posture: Lumbar lordosis: WNL Iliac crest height: Equal bilaterally Lumbar lateral shift: Negative  AROM AROM (Normal range in degrees) AROM  08/11/23  Lumbar   Flexion (65) 75% (pain with end-range trunk flexion)  Extension (30) 50% (pulling anterior hip)  Right  lateral flexion (25) WFL  Left lateral flexion (25) WFL  Right rotation (30) 75%  Left rotation (30) 75% (pain with end-range rotation to L)      Hip Right Left  Flexion (125) 90*   Extension (15)    Abduction (40) 30   Adduction     Internal Rotation (45) 32* 35  External Rotation (45) 45 (pain with return to neutral)* WNL      Knee    Flexion (135) WNL   Extension (0) WNL       (* = pain; Blank rows = not tested)  LE MMT: MMT (out of 5) Right 08/11/23 Left 08/11/23  Hip flexion 4-* 4  Hip extension 4+ 5-  Hip abduction (seated) 5 4+ sidelying 5  Hip adduction (seated) 5 5  Hip internal rotation    Hip external rotation    Knee  flexion 5 5  Knee extension 5 5  Ankle dorsiflexion    Ankle plantarflexion    Ankle inversion    Ankle eversion    (* = pain; Blank rows = not tested)  Sensation Deferred  Reflexes Deferred  Muscle Length Thomas (hip flexors): R: Positive for mild 10 deg deficit  L: Not examined Ober: R: Not examined L: Not examined  Palpation TTP R distal illiopsoas 1+ (Blank rows = not tested) Graded on 0-4 scale (0 = no pain, 1 = pain, 2 = pain with wincing/grimacing/flinching, 3 = pain with withdrawal, 4 = unwilling to allow palpation)  Passive Accessory Intervertebral Motion Pt denies reproduction of back pain with CPA L1-L5 and UPA bilaterally L1-L5. Generally, hypomobile throughout  Special Tests Lumbar Radiculopathy and Discogenic: Centralization and Peripheralization (SN 92, -LR 0.12): Not examined Slump (SN 83, -LR 0.32): R: Negative L: Negative SLR (SN 92, -LR 0.29): R: Positive L:  Negative  Lumbar Foraminal Stenosis: Lumbar quadrant (SN 70): R: Negative L: Negative  Hip: FABER (SN 81): R: Positive L: Negative Hip scour (SN 50): R: Positive L: Negative  Labrum: Anterior Labral Tear Test: Positive     TODAY'S TREATMENT: DATE: 09/08/2023   SUBJECTIVE STATEMENT:   Patient reports no major changes since last visit. She reports notable pain with attempted flutter kicks/leg lift exercises in supine. Patient reports limitation with flexing her trunk to get down to foot/ankle. Patient reports no notable pain at arrival other than when trunk/hip is flexed.    Manual Therapy - for symptom modulation, soft tissue sensitivity and mobility, joint mobility, ROM   Mulligan belt hip mobilization:   -Lateral distraction; 3 x 30 sec bouts with pt in hooklying  -Lateral distraction with hip flexion and IR as tolerated; x 10 reps for each L hip PROM within pt tolerance; x 1 min  Therapeutic Exercise - for improved soft tissue flexibility and extensibility as needed for ROM, improved  strength as needed to improve performance of CKC activities/functional movements   Quadruped rockback; x 5 with hip neutral, x 10 with hip slightly externally rotated (< 30 deg); 5 sec hold  Bridge with alternating R/L knee extension; 2 x 6 alt Single-leg bridge on RLE, LLE extended; 1 x 10  Total Gym, double-limb squats, depth to tolerance; 2 x 10  TRX alternating reverse lunge; 2 x 10 alt R/L   Neuromuscular Re-education - exercises and drills to promote LE kinetic chain stability and improved proprioception/kinesthesia   Single limb stance on Airex; 2 x 30 sec  SLS with Rebounder ball toss; 6-lb Medball; x 20 tosses  PATIENT  EDUCATION:  HEP review.   *not today* TRX squats (to tolerance) 2 x 15  TRX alternating lateral lunge (depth to tolerance); 2 x 10 alternating R/L 3-way kick; x8 ea dir on each LE; Red Tband around ankles   -standing adjacent to // bars for light unilateral UE support Modified Thomas stretch 2 x 30 seconds Prone Quad stretch, manual; x 60 sec Kneeling hip flexor stretch; 2 x 30 seconds on R  Nustep level 3 x 7 minutes - BLE only -- patient reporting discomfort after minute 5 - seat #7 Knee flexion stretch on 2nd step;  3 x 30 seconds on R     PATIENT EDUCATION:  Education details: see above for patient education details Person educated: Patient Education method: Explanation, Demonstration, and Handouts Education comprehension: verbalized understanding and returned demonstration   HOME EXERCISE PROGRAM:  Access Code: [email or web address] URL: [email or web address] Date: 08/13/2023 Prepared by: Venetia Endo  Exercises - Half Kneeling Hip Flexor Stretch  - 2 x daily - 7 x weekly - 3 sets - 30sec hold - Supine Bridge  - 2 x daily - 7 x weekly - 2 sets - 10 reps - Clamshell  - 2 x daily - 7 x weekly - 2 sets - 10 reps - Quadruped Rocking Backward  - 2 x daily - 7 x weekly - 2 sets - 10 reps - Supine Hip Flexion with Resistance Loop  - 1 x  daily - 7 x weekly - 2 sets - 10 reps   ASSESSMENT:  CLINICAL IMPRESSION:   Patient fortunately feels that numerous activities and functional mobility tasks are improved; she has ongoing deficit with flexing R hip in open-chain position or with kneeling over/bending over toward R side (e.g. dressing/self-care). Pt tolerates new stabilization drills well without notable increase in pain. Pt has remaining deficits in: bending, reaching to ground, car transfers, higher volume of stair negotiation, R hip stiffness and motion loss, hip flexor/psoas tightness, decreased hip flexor strength and minimal hip extensor weakness, taut/tender distal iliopsoas, and gait changes (see above). Pt will continue to benefit from skilled PT services to address deficits and improve function.   OBJECTIVE IMPAIRMENTS: Abnormal gait, decreased mobility, difficulty walking, decreased ROM, decreased strength, hypomobility, impaired flexibility, and pain.   ACTIVITY LIMITATIONS: bending, sitting, squatting, sleeping, stairs, transfers, and bed mobility  PARTICIPATION LIMITATIONS: driving, community activity, and occupation  PERSONAL FACTORS: Past/current experiences, Time since onset of injury/illness/exacerbation, and 1-2 comorbidities: (HTN, depression) are also affecting patient's functional outcome.   REHAB POTENTIAL: Good  CLINICAL DECISION MAKING: Evolving/moderate complexity  EVALUATION COMPLEXITY: Moderate   GOALS: Goals reviewed with patient? Yes  SHORT TERM GOALS: Target date: 09/01/2023  Pt will be independent with HEP in order to improve strength and decrease back pain to improve pain-free function at home and work. Baseline: 08/11/23: Baseline HEP initiated.  Goal status: INITIAL   LONG TERM GOALS: Target date: 09/24/2023  Pt will have R hip flexion to 120 deg or greater and ABD to 40 deg or greater without reproduction of groin pain as needed for bed mobility tasks, ability to complete bending  lower to ground, and intermittently for self-care ADLs Baseline: 08/11/23: Motion loss with R hip flexion, ER, IR, ABD. Goal status: INITIAL  2.  Pt will decrease worst hip pain by at least 3 points on the NPRS in order to demonstrate clinically significant reduction in back pain. Baseline: 08/11/23: 7/10 at worst Goal status: INITIAL  3.  Pt will improve LEFS by 10 points  or greater indicative of clinically meaningful change in pt function   Baseline: 08/11/23: 61/80 Goal status: INITIAL  4.  Patient will negotiate full flight of steps with reciprocal pattern and no significant buckling or movement deviations without reproduction of hip pain.  Baseline: 08/11/23: limited with higher volume of stair climbing.  Goal status: INITIAL  5.  Patient will tolerate full trunk flexion in standing without reproduction of groin pain as needed for reaching to ground and intermittently for self-care ADLs Baseline: 08/11/23: Limited trunk flexion with groin pain.  Goal status: INITIAL   PLAN: PT FREQUENCY: 1-2x/week  PT DURATION: 6 weeks  PLANNED INTERVENTIONS: Therapeutic exercises, Therapeutic activity, Neuromuscular re-education, Balance training, Gait training, Patient/Family education, Self Care, Joint mobilization, Joint manipulation, Vestibular training, Canalith repositioning, Orthotic/Fit training, DME instructions, Dry Needling, Electrical stimulation, Spinal manipulation, Spinal mobilization, Cryotherapy, Moist heat, Taping, Traction, Ultrasound, Ionotophoresis 4mg /ml Dexamethasone , Manual therapy, and Re-evaluation.  PLAN FOR NEXT SESSION: Mulligan belt techniques to improve tolerance to flexion and ER/IR and for symptom modulation. Psoas stretching. Progressive hip flexor strengthening and hip stabilization.    Venetia Endo, PT, DPT [phone]  Physical Therapist - [facility] 09/08/2023, 4:01 PM

## 2023-09-10 ENCOUNTER — Ambulatory Visit: Admitting: Physical Therapy

## 2023-09-10 ENCOUNTER — Encounter: Payer: Self-pay | Admitting: Physical Therapy

## 2023-09-10 NOTE — Therapy (Deleted)
 OUTPATIENT PHYSICAL THERAPY TREATMENT   Patient Name: Denise Bolton MRN: [id number] DOB:11-17-1980, 43 y.o., female Today's Date: 09/10/2023  END OF SESSION:          Past Medical History:  Diagnosis Date   Anemia    prior to hysterectomy   BRCA negative 10/2014   MyRisk neg; IBIS=11.1%   Depression    Family history of ovarian cancer    MOTHER   Genetic testing of female 10/2014   IBIS=11.1%   GERD (gastroesophageal reflux disease)    Headache    migraines   Hypertension    h/o-came off meds per md instruction and changed lifestyle/diet and bp now controlled with no meds   Menorrhagia    Past Surgical History:  Procedure Laterality Date   ABDOMINAL HYSTERECTOMY     ABDOMINOPLASTY     ABDOMINOPLASTY  2007   FATTY TISSUE REMOVED   AUGMENTATION MAMMAPLASTY Bilateral 2007   BREAST BIOPSY Right 01/07/2022   stereo bx, ribbon marker, path pending   BREAST BIOPSY Right 01/07/2022   MM RT BREAST BX W LOC DEV 1ST LESION IMAGE BX SPEC STEREO GUIDE 01/07/2022 [facility]   BREAST BIOPSY Right 02/05/2022   MM RT RADIO FREQUENCY TAG LOC MAMMO GUIDE 02/05/2022 [facility]   BREAST LUMPECTOMY WITH RADIO FREQUENCY LOCALIZER Right 02/06/2022   Procedure: BREAST LUMPECTOMY WITH RADIO FREQUENCY LOCALIZER;  Surgeon: Jordis Laneta FALCON, MD;  Location: [facility];  Service: General;  Laterality: Right;   BREAST SURGERY     augmentation   CESAREAN SECTION     x 2   CYSTOSCOPY  01/11/2015   Procedure: CYSTOSCOPY;  Surgeon: Glory High, MD;  Location: [facility];  Service: Gynecology;;   LAPAROSCOPIC BILATERAL SALPINGECTOMY Bilateral 01/11/2015   Procedure: LAPAROSCOPIC BILATERAL SALPINGECTOMY;  Surgeon: Glory High, MD;  Location: [facility];  Service: Gynecology;  Laterality: Bilateral;   LAPAROSCOPIC HYSTERECTOMY N/A 01/11/2015   Procedure: HYSTERECTOMY TOTAL LAPAROSCOPIC;  Surgeon: Glory High, MD;  Location: [facility];  Service: Gynecology;  Laterality: N/A;    Patient Active Problem List   Diagnosis Date Noted   Family history of ovarian cancer 03/18/2022   Atypical lobular hyperplasia [facility]) of right breast 02/06/2022   Annual physical exam 04/08/2019   Recurrent major depressive disorder, in partial remission (HCC) 06/22/2017   S/P laparoscopic hysterectomy 01/11/2015   Iron deficiency anemia 08/22/2014   Palpitations 07/11/2013   Preeclampsia 07/11/2013    PCP: Joshua Cathryne BROCKS, MD (Inactive)  REFERRING PROVIDER: Verlinda Boas, PA-C  REFERRING DIAG: F74.448 (ICD-10-CM) - Pain of right hip   RATIONALE FOR EVALUATION AND TREATMENT: Rehabilitation  THERAPY DIAG: Pain in right hip  Difficulty in walking, not elsewhere classified  Muscle weakness (generalized)  ONSET DATE: Jan 2023  FOLLOW-UP APPT SCHEDULED WITH REFERRING PROVIDER: No f/u scheduled now   SUBJECTIVE:  SUBJECTIVE STATEMENT:  Pt is a 43 year old female with primary complaint of R hip pain; referring diagnosis of R hip OA.   PERTINENT HISTORY: Pt is a 43 year old female with primary complaint of R hip pain; referring diagnosis of R hip OA.   She had an ultrasound-guided cortisone injection in 2023 that only helped slightly. She has been doing activity modification and stretching. She is requesting an order for physical therapy for more stretching and strengthening involving her legs. She is concerned about compensating to the left side. She is still working and is quite active. She stays away from high-impact activities.   Pt reports that pain began with change in work duties. Pt reports symptoms began with completion of butterfly stretch at night and waking up with notable pain affecting R side. Pt reports continuing with walking regimen - refraining from high impact activity.   History of  popping in R hip historically; some remote history of catching/clicking. Pt reports relief of symptoms with Meloxicam and rest. Difficulty with car transfer into mid-size SUV.    PAIN:    Pain Intensity: Present: 0/10, Best: 0/10, Worst: 7/10 Pain location: Anterior hip/groin pain  Pain Quality: burning , sharp with bending toward affected side  Radiating: Yes; pain in R knee also when pushing too far  Numbness/Tingling: No History of buckling: LE can buckle to closed-chain flexion too far  Focal Weakness: No Aggravating factors: trunk/hip flexion, forward bending, bending toward affected hip, high volume of stairs, car transfer Relieving factors: Meloxicam, rest, walking, stretches, hip flexor/lower abdominal exercises 24-hour pain behavior: PM after sitting too long  How long can you sit: How long can you stand: History of prior back injury, pain, surgery, or therapy: Yes; hx of cortisone injection without benefit  Imaging: Yes (see below)  Xrays revealed the right hip with mild femoral head irregularity with inferior femoral neck osteophyte formation. There is no real degenerative joint space narrowing as compared to the left.   Red flags: Negative for bowel/bladder changes, saddle paresthesia, personal history of cancer, h/o spinal tumors, h/o compression fx, h/o abdominal aneurysm, abdominal pain, chills/fever, night sweats, nausea, vomiting, unrelenting pain, first onset of insidious LBP <20 y/o  PRECAUTIONS: None  WEIGHT BEARING RESTRICTIONS: No  FALLS: Has patient fallen in last 6 months? 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  Recent memory is intact.  Remote memory is intact.  Attention span and concentration are intact.  Expressive speech is intact.  Patient's fund of knowledge is within normal limits for educational level.    Gross Musculoskeletal Assessment Tremor: None Bulk: Normal Tone: Normal   GAIT: Distance walked: 30 ft Assistive device utilized: None Level of assistance: Complete Independence Comments: Mild pelvic drop R>L during stance phase, lower extremities externally rotated, increased cadence and decreased stride length   Posture: Lumbar lordosis: WNL Iliac crest height: Equal bilaterally Lumbar lateral shift: Negative  AROM AROM (Normal range in degrees) AROM  08/11/23  Lumbar   Flexion (65) 75% (pain with end-range trunk flexion)  Extension (30) 50% (pulling anterior hip)  Right  lateral flexion (25) WFL  Left lateral flexion (25) WFL  Right rotation (30) 75%  Left rotation (30) 75% (pain with end-range rotation to L)      Hip Right Left  Flexion (125) 90*   Extension (15)    Abduction (40) 30   Adduction     Internal Rotation (45) 32* 35  External Rotation (45) 45 (pain with return to neutral)* WNL      Knee    Flexion (135) WNL   Extension (0) WNL       (* = pain; Blank rows = not tested)  LE MMT: MMT (out of 5) Right 08/11/23 Left 08/11/23  Hip flexion 4-* 4  Hip extension 4+ 5-  Hip abduction (seated) 5 4+ sidelying 5  Hip adduction (seated) 5 5  Hip internal rotation    Hip external rotation    Knee flexion 5 5  Knee extension 5 5  Ankle dorsiflexion    Ankle plantarflexion    Ankle inversion    Ankle eversion    (* = pain; Blank rows = not tested)  Sensation Deferred  Reflexes Deferred  Muscle Length Thomas (hip flexors): R: Positive for mild 10 deg deficit  L: Not examined Ober: R: Not examined L: Not  examined  Palpation TTP R distal illiopsoas 1+ (Blank rows = not tested) Graded on 0-4 scale (0 = no pain, 1 = pain, 2 = pain with wincing/grimacing/flinching, 3 = pain with withdrawal, 4 = unwilling to allow palpation)  Passive Accessory Intervertebral Motion Pt denies reproduction of back pain with CPA L1-L5 and UPA bilaterally L1-L5. Generally, hypomobile throughout  Special Tests Lumbar Radiculopathy and Discogenic: Centralization and Peripheralization (SN 92, -LR 0.12): Not examined Slump (SN 83, -LR 0.32): R: Negative L: Negative SLR (SN 92, -LR 0.29): R: Positive L:  Negative  Lumbar Foraminal Stenosis: Lumbar quadrant (SN 70): R: Negative L: Negative  Hip: FABER (SN 81): R: Positive L: Negative Hip scour (SN 50): R: Positive L: Negative  Labrum: Anterior Labral Tear Test: Positive     TODAY'S TREATMENT: DATE: 09/10/2023   SUBJECTIVE STATEMENT:   Patient reports no major changes since last visit. She reports notable pain with attempted flutter kicks/leg lift exercises in supine. Patient reports limitation with flexing her trunk to get down to foot/ankle. Patient reports no notable pain at arrival other than when trunk/hip is flexed.    Manual Therapy - for symptom modulation, soft tissue sensitivity and mobility, joint mobility, ROM   Mulligan belt hip mobilization:   -Lateral distraction; 3 x 30 sec bouts with pt in hooklying  -Lateral distraction with hip flexion and IR as tolerated; x 10 reps for each L hip PROM within pt tolerance; x 1 min  Therapeutic Exercise - for improved soft tissue flexibility and extensibility as needed for ROM, improved strength as needed to improve performance of CKC activities/functional movements   Quadruped rockback; x 5 with hip neutral, x 10 with hip slightly externally rotated (< 30 deg); 5 sec hold  Bridge with alternating R/L knee extension; 2 x 6 alt Single-leg bridge on RLE, LLE extended; 1 x 10  Total Gym, double-limb  squats, depth to tolerance; 2 x 10  TRX alternating reverse lunge; 2 x 10 alt R/L   Neuromuscular Re-education - exercises and drills to promote LE kinetic chain stability and improved proprioception/kinesthesia   Single limb stance on Airex; 2 x 30 sec  SLS with Rebounder ball toss; 6-lb Medball; x 20 tosses  PATIENT  EDUCATION:  HEP review.   *not today* TRX squats (to tolerance) 2 x 15  TRX alternating lateral lunge (depth to tolerance); 2 x 10 alternating R/L 3-way kick; x8 ea dir on each LE; Red Tband around ankles   -standing adjacent to // bars for light unilateral UE support Modified Thomas stretch 2 x 30 seconds Prone Quad stretch, manual; x 60 sec Kneeling hip flexor stretch; 2 x 30 seconds on R  Nustep level 3 x 7 minutes - BLE only -- patient reporting discomfort after minute 5 - seat #7 Knee flexion stretch on 2nd step;  3 x 30 seconds on R     PATIENT EDUCATION:  Education details: see above for patient education details Person educated: Patient Education method: Explanation, Demonstration, and Handouts Education comprehension: verbalized understanding and returned demonstration   HOME EXERCISE PROGRAM:  Access Code: [email or web address] URL: [email or web address] Date: 08/13/2023 Prepared by: Venetia Endo  Exercises - Half Kneeling Hip Flexor Stretch  - 2 x daily - 7 x weekly - 3 sets - 30sec hold - Supine Bridge  - 2 x daily - 7 x weekly - 2 sets - 10 reps - Clamshell  - 2 x daily - 7 x weekly - 2 sets - 10 reps - Quadruped Rocking Backward  - 2 x daily - 7 x weekly - 2 sets - 10 reps - Supine Hip Flexion with Resistance Loop  - 1 x daily - 7 x weekly - 2 sets - 10 reps   ASSESSMENT:  CLINICAL IMPRESSION:   Patient fortunately feels that numerous activities and functional mobility tasks are improved; she has ongoing deficit with flexing R hip in open-chain position or with kneeling over/bending over toward R side (e.g. dressing/self-care). Pt  tolerates new stabilization drills well without notable increase in pain. Pt has remaining deficits in: bending, reaching to ground, car transfers, higher volume of stair negotiation, R hip stiffness and motion loss, hip flexor/psoas tightness, decreased hip flexor strength and minimal hip extensor weakness, taut/tender distal iliopsoas, and gait changes (see above). Pt will continue to benefit from skilled PT services to address deficits and improve function.   OBJECTIVE IMPAIRMENTS: Abnormal gait, decreased mobility, difficulty walking, decreased ROM, decreased strength, hypomobility, impaired flexibility, and pain.   ACTIVITY LIMITATIONS: bending, sitting, squatting, sleeping, stairs, transfers, and bed mobility  PARTICIPATION LIMITATIONS: driving, community activity, and occupation  PERSONAL FACTORS: Past/current experiences, Time since onset of injury/illness/exacerbation, and 1-2 comorbidities: (HTN, depression) are also affecting patient's functional outcome.   REHAB POTENTIAL: Good  CLINICAL DECISION MAKING: Evolving/moderate complexity  EVALUATION COMPLEXITY: Moderate   GOALS: Goals reviewed with patient? Yes  SHORT TERM GOALS: Target date: 09/01/2023  Pt will be independent with HEP in order to improve strength and decrease back pain to improve pain-free function at home and work. Baseline: 08/11/23: Baseline HEP initiated.  Goal status: INITIAL   LONG TERM GOALS: Target date: 09/24/2023  Pt will have R hip flexion to 120 deg or greater and ABD to 40 deg or greater without reproduction of groin pain as needed for bed mobility tasks, ability to complete bending lower to ground, and intermittently for self-care ADLs Baseline: 08/11/23: Motion loss with R hip flexion, ER, IR, ABD. Goal status: INITIAL  2.  Pt will decrease worst hip pain by at least 3 points on the NPRS in order to demonstrate clinically significant reduction in back pain. Baseline: 08/11/23: 7/10 at worst Goal  status: INITIAL  3.  Pt will improve LEFS by 10 points  or greater indicative of clinically meaningful change in pt function   Baseline: 08/11/23: 61/80 Goal status: INITIAL  4.  Patient will negotiate full flight of steps with reciprocal pattern and no significant buckling or movement deviations without reproduction of hip pain.  Baseline: 08/11/23: limited with higher volume of stair climbing.  Goal status: INITIAL  5.  Patient will tolerate full trunk flexion in standing without reproduction of groin pain as needed for reaching to ground and intermittently for self-care ADLs Baseline: 08/11/23: Limited trunk flexion with groin pain.  Goal status: INITIAL   PLAN: PT FREQUENCY: 1-2x/week  PT DURATION: 6 weeks  PLANNED INTERVENTIONS: Therapeutic exercises, Therapeutic activity, Neuromuscular re-education, Balance training, Gait training, Patient/Family education, Self Care, Joint mobilization, Joint manipulation, Vestibular training, Canalith repositioning, Orthotic/Fit training, DME instructions, Dry Needling, Electrical stimulation, Spinal manipulation, Spinal mobilization, Cryotherapy, Moist heat, Taping, Traction, Ultrasound, Ionotophoresis 4mg /ml Dexamethasone , Manual therapy, and Re-evaluation.  PLAN FOR NEXT SESSION: Mulligan belt techniques to improve tolerance to flexion and ER/IR and for symptom modulation. Psoas stretching. Progressive hip flexor strengthening and hip stabilization.    Venetia Endo, PT, DPT [phone]  Physical Therapist - [facility] 09/10/2023, 12:08 PM

## 2023-09-15 ENCOUNTER — Ambulatory Visit: Admitting: Physical Therapy

## 2023-09-15 DIAGNOSIS — M25551 Pain in right hip: Secondary | ICD-10-CM | POA: Diagnosis not present

## 2023-09-15 DIAGNOSIS — R262 Difficulty in walking, not elsewhere classified: Secondary | ICD-10-CM | POA: Diagnosis not present

## 2023-09-15 DIAGNOSIS — M6281 Muscle weakness (generalized): Secondary | ICD-10-CM

## 2023-09-15 NOTE — Therapy (Unsigned)
 OUTPATIENT PHYSICAL THERAPY TREATMENT   Patient Name: Denise Bolton MRN: [id number] DOB:12/18/1980, 43 y.o., female Today's Date: 09/15/2023  END OF SESSION:  PT End of Session - 09/15/23 1549     Visit Number 8    Number of Visits 13    Date for PT Re-Evaluation 09/24/23    PT Start Time 1604    PT Stop Time 1644    PT Time Calculation (min) 40 min    Activity Tolerance Patient limited by pain;Patient tolerated treatment well    Behavior During Therapy [facility] for tasks assessed/performed           Past Medical History:  Diagnosis Date   Anemia    prior to hysterectomy   BRCA negative 10/2014   MyRisk neg; IBIS=11.1%   Depression    Family history of ovarian cancer    MOTHER   Genetic testing of female 10/2014   IBIS=11.1%   GERD (gastroesophageal reflux disease)    Headache    migraines   Hypertension    h/o-came off meds per md instruction and changed lifestyle/diet and bp now controlled with no meds   Menorrhagia    Past Surgical History:  Procedure Laterality Date   ABDOMINAL HYSTERECTOMY     ABDOMINOPLASTY     ABDOMINOPLASTY  2007   FATTY TISSUE REMOVED   AUGMENTATION MAMMAPLASTY Bilateral 2007   BREAST BIOPSY Right 01/07/2022   stereo bx, ribbon marker, path pending   BREAST BIOPSY Right 01/07/2022   MM RT BREAST BX W LOC DEV 1ST LESION IMAGE BX SPEC STEREO GUIDE 01/07/2022 [facility]   BREAST BIOPSY Right 02/05/2022   MM RT RADIO FREQUENCY TAG LOC MAMMO GUIDE 02/05/2022 [facility]   BREAST LUMPECTOMY WITH RADIO FREQUENCY LOCALIZER Right 02/06/2022   Procedure: BREAST LUMPECTOMY WITH RADIO FREQUENCY LOCALIZER;  Surgeon: Jordis Laneta FALCON, MD;  Location: [facility];  Service: General;  Laterality: Right;   BREAST SURGERY     augmentation   CESAREAN SECTION     x 2   CYSTOSCOPY  01/11/2015   Procedure: CYSTOSCOPY;  Surgeon: Glory High, MD;  Location: [facility];  Service: Gynecology;;   LAPAROSCOPIC BILATERAL SALPINGECTOMY Bilateral 01/11/2015    Procedure: LAPAROSCOPIC BILATERAL SALPINGECTOMY;  Surgeon: Glory High, MD;  Location: [facility];  Service: Gynecology;  Laterality: Bilateral;   LAPAROSCOPIC HYSTERECTOMY N/A 01/11/2015   Procedure: HYSTERECTOMY TOTAL LAPAROSCOPIC;  Surgeon: Glory High, MD;  Location: [facility];  Service: Gynecology;  Laterality: N/A;   Patient Active Problem List   Diagnosis Date Noted   Family history of ovarian cancer 03/18/2022   Atypical lobular hyperplasia [facility]) of right breast 02/06/2022   Annual physical exam 04/08/2019   Recurrent major depressive disorder, in partial remission (HCC) 06/22/2017   S/P laparoscopic hysterectomy 01/11/2015   Iron deficiency anemia 08/22/2014   Palpitations 07/11/2013   Preeclampsia 07/11/2013    PCP: Joshua Cathryne BROCKS, MD (Inactive)  REFERRING PROVIDER: Verlinda Boas, PA-C  REFERRING DIAG: F74.448 (ICD-10-CM) - Pain of right hip   RATIONALE FOR EVALUATION AND TREATMENT: Rehabilitation  THERAPY DIAG: Pain in right hip  Difficulty in walking, not elsewhere classified  Muscle weakness (generalized)  ONSET DATE: Jan 2023  FOLLOW-UP APPT SCHEDULED WITH REFERRING PROVIDER: No f/u scheduled now   SUBJECTIVE:  SUBJECTIVE STATEMENT:  Pt is a 43 year old female with primary complaint of R hip pain; referring diagnosis of R hip OA.   PERTINENT HISTORY: Pt is a 43 year old female with primary complaint of R hip pain; referring diagnosis of R hip OA.   She had an ultrasound-guided cortisone injection in 2023 that only helped slightly. She has been doing activity modification and stretching. She is requesting an order for physical therapy for more stretching and strengthening involving her legs. She is concerned about compensating to the left side. She is still working and is  quite active. She stays away from high-impact activities.   Pt reports that pain began with change in work duties. Pt reports symptoms began with completion of butterfly stretch at night and waking up with notable pain affecting R side. Pt reports continuing with walking regimen - refraining from high impact activity.   History of popping in R hip historically; some remote history of catching/clicking. Pt reports relief of symptoms with Meloxicam and rest. Difficulty with car transfer into mid-size SUV.    PAIN:    Pain Intensity: Present: 0/10, Best: 0/10, Worst: 7/10 Pain location: Anterior hip/groin pain  Pain Quality: burning , sharp with bending toward affected side  Radiating: Yes; pain in R knee also when pushing too far  Numbness/Tingling: No History of buckling: LE can buckle to closed-chain flexion too far  Focal Weakness: No Aggravating factors: trunk/hip flexion, forward bending, bending toward affected hip, high volume of stairs, car transfer Relieving factors: Meloxicam, rest, walking, stretches, hip flexor/lower abdominal exercises 24-hour pain behavior: PM after sitting too long  How long can you sit: How long can you stand: History of prior back injury, pain, surgery, or therapy: Yes; hx of cortisone injection without benefit  Imaging: Yes (see below)  Xrays revealed the right hip with mild femoral head irregularity with inferior femoral neck osteophyte formation. There is no real degenerative joint space narrowing as compared to the left.   Red flags: Negative for bowel/bladder changes, saddle paresthesia, personal history of cancer, h/o spinal tumors, h/o compression fx, h/o abdominal aneurysm, abdominal pain, chills/fever, night sweats, nausea, vomiting, unrelenting pain, first onset of insidious LBP <20 y/o  PRECAUTIONS: None  WEIGHT BEARING RESTRICTIONS: No  FALLS: Has patient fallen in last 6 months? 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  Recent memory is intact.  Remote memory is intact.  Attention span and concentration are intact.  Expressive speech is intact.  Patient's fund of knowledge is within normal limits for educational level.    Gross Musculoskeletal Assessment Tremor: None Bulk: Normal Tone: Normal   GAIT: Distance walked: 30 ft Assistive device utilized: None Level of assistance: Complete Independence Comments: Mild pelvic drop R>L during stance phase, lower extremities externally rotated, increased cadence and decreased stride length   Posture: Lumbar lordosis: WNL Iliac crest height: Equal bilaterally Lumbar lateral shift: Negative  AROM AROM (Normal range in degrees) AROM  08/11/23  Lumbar   Flexion (65) 75% (pain with end-range trunk flexion)  Extension (30) 50% (pulling anterior hip)  Right  lateral flexion (25) WFL  Left lateral flexion (25) WFL  Right rotation (30) 75%  Left rotation (30) 75% (pain with end-range rotation to L)      Hip Right Left  Flexion (125) 90*   Extension (15)    Abduction (40) 30   Adduction     Internal Rotation (45) 32* 35  External Rotation (45) 45 (pain with return to neutral)* WNL      Knee    Flexion (135) WNL   Extension (0) WNL       (* = pain; Blank rows = not tested)  LE MMT: MMT (out of 5) Right 08/11/23 Left 08/11/23  Hip flexion 4-* 4  Hip extension 4+ 5-  Hip abduction (seated) 5 4+ sidelying 5  Hip adduction (seated) 5 5  Hip internal rotation    Hip external rotation    Knee flexion 5 5   Knee extension 5 5  Ankle dorsiflexion    Ankle plantarflexion    Ankle inversion    Ankle eversion    (* = pain; Blank rows = not tested)  Sensation Deferred  Reflexes Deferred  Muscle Length Thomas (hip flexors): R: Positive for mild 10 deg deficit  L: Not examined Ober: R: Not examined L: Not examined  Palpation TTP R distal illiopsoas 1+ (Blank rows = not tested) Graded on 0-4 scale (0 = no pain, 1 = pain, 2 = pain with wincing/grimacing/flinching, 3 = pain with withdrawal, 4 = unwilling to allow palpation)  Passive Accessory Intervertebral Motion Pt denies reproduction of back pain with CPA L1-L5 and UPA bilaterally L1-L5. Generally, hypomobile throughout  Special Tests Lumbar Radiculopathy and Discogenic: Centralization and Peripheralization (SN 92, -LR 0.12): Not examined Slump (SN 83, -LR 0.32): R: Negative L: Negative SLR (SN 92, -LR 0.29): R: Positive L:  Negative  Lumbar Foraminal Stenosis: Lumbar quadrant (SN 70): R: Negative L: Negative  Hip: FABER (SN 81): R: Positive L: Negative Hip scour (SN 50): R: Positive L: Negative  Labrum: Anterior Labral Tear Test: Positive     TODAY'S TREATMENT: DATE: 09/15/2023   SUBJECTIVE STATEMENT:   Patient reports ongoing difficulty with hip flexion in open chain or closed chain to complete self-care tasks with feet/toes. Patient reports no significant baseline pain. Pt reports pain is largely along distal iliopsoas/pectineus region along anterior hip.   Manual Therapy - for symptom modulation, soft tissue sensitivity and mobility, joint mobility, ROM   Mulligan belt hip mobilization:   -Lateral distraction; 3 x 30 sec bouts with pt in hooklying  -Lateral distraction with hip flexion and IR as tolerated; x 10 reps for each L hip PROM within pt tolerance; x 1 min  Therapeutic Exercise - for improved soft tissue flexibility and extensibility as needed for ROM, improved strength as needed to improve performance of  [facility] activities/functional movements   Quadruped rockback; x 5 with hip neutral, x 10 with hip slightly externally rotated (< 30 deg); 5 sec hold Quadruped donkey kick; 2 x 10 with each LE   Single-leg bridge on RLE, LLE extended; 2 x 10   TRX alternating reverse lunge; 2 x 10 alt R/L  *not today* Bridge with alternating R/L knee extension; 2 x 6 alt Total Gym, double-limb squats, depth to tolerance; 2 x 10  Neuromuscular Re-education - exercises and drills to promote LE kinetic chain stability and improved proprioception/kinesthesia   Single limb stance on Airex; 2 x 30 sec  SLS with Rebounder ball toss; 6-lb Medball; x 20  tosses  PATIENT EDUCATION:  HEP review.   *not today* TRX squats (to tolerance) 2 x 15  TRX alternating lateral lunge (depth to tolerance); 2 x 10 alternating R/L 3-way kick; x8 ea dir on each LE; Red Tband around ankles   -standing adjacent to // bars for light unilateral UE support Modified Thomas stretch 2 x 30 seconds Prone Quad stretch, manual; x 60 sec Kneeling hip flexor stretch; 2 x 30 seconds on R  Nustep level 3 x 7 minutes - BLE only -- patient reporting discomfort after minute 5 - seat #7 Knee flexion stretch on 2nd step;  3 x 30 seconds on R     PATIENT EDUCATION:  Education details: see above for patient education details Person educated: Patient Education method: Explanation, Demonstration, and Handouts Education comprehension: verbalized understanding and returned demonstration   HOME EXERCISE PROGRAM:  Access Code: [email or web address] URL: [email or web address] Date: 08/13/2023 Prepared by: Venetia Endo  Exercises - Half Kneeling Hip Flexor Stretch  - 2 x daily - 7 x weekly - 3 sets - 30sec hold - Supine Bridge  - 2 x daily - 7 x weekly - 2 sets - 10 reps - Clamshell  - 2 x daily - 7 x weekly - 2 sets - 10 reps - Quadruped Rocking Backward  - 2 x daily - 7 x weekly - 2 sets - 10 reps - Supine Hip Flexion with Resistance  Loop  - 1 x daily - 7 x weekly - 2 sets - 10 reps   ASSESSMENT:  CLINICAL IMPRESSION:   Patient fortunately feels that numerous activities and functional mobility tasks are improved; she has ongoing deficit with flexing R hip in open-chain position or with kneeling over/bending over toward R side (e.g. dressing/self-care). Pt tolerates new stabilization drills well without notable increase in pain. Pt has remaining deficits in: bending, reaching to ground, car transfers, higher volume of stair negotiation, R hip stiffness and motion loss, hip flexor/psoas tightness, decreased hip flexor strength and minimal hip extensor weakness, taut/tender distal iliopsoas, and gait changes (see above). Pt will continue to benefit from skilled PT services to address deficits and improve function.   OBJECTIVE IMPAIRMENTS: Abnormal gait, decreased mobility, difficulty walking, decreased ROM, decreased strength, hypomobility, impaired flexibility, and pain.   ACTIVITY LIMITATIONS: bending, sitting, squatting, sleeping, stairs, transfers, and bed mobility  PARTICIPATION LIMITATIONS: driving, community activity, and occupation  PERSONAL FACTORS: Past/current experiences, Time since onset of injury/illness/exacerbation, and 1-2 comorbidities: (HTN, depression) are also affecting patient's functional outcome.   REHAB POTENTIAL: Good  CLINICAL DECISION MAKING: Evolving/moderate complexity  EVALUATION COMPLEXITY: Moderate   GOALS: Goals reviewed with patient? Yes  SHORT TERM GOALS: Target date: 09/01/2023  Pt will be independent with HEP in order to improve strength and decrease back pain to improve pain-free function at home and work. Baseline: 08/11/23: Baseline HEP initiated.  Goal status: INITIAL   LONG TERM GOALS: Target date: 09/24/2023  Pt will have R hip flexion to 120 deg or greater and ABD to 40 deg or greater without reproduction of groin pain as needed for bed mobility tasks, ability to complete  bending lower to ground, and intermittently for self-care ADLs Baseline: 08/11/23: Motion loss with R hip flexion, ER, IR, ABD. Goal status: INITIAL  2.  Pt will decrease worst hip pain by at least 3 points on the NPRS in order to demonstrate clinically significant reduction in back pain. Baseline: 08/11/23: 7/10 at worst Goal status: INITIAL  3.  Pt will improve LEFS  by 10 points or greater indicative of clinically meaningful change in pt function   Baseline: 08/11/23: 61/80 Goal status: INITIAL  4.  Patient will negotiate full flight of steps with reciprocal pattern and no significant buckling or movement deviations without reproduction of hip pain.  Baseline: 08/11/23: limited with higher volume of stair climbing.  Goal status: INITIAL  5.  Patient will tolerate full trunk flexion in standing without reproduction of groin pain as needed for reaching to ground and intermittently for self-care ADLs Baseline: 08/11/23: Limited trunk flexion with groin pain.  Goal status: INITIAL   PLAN: PT FREQUENCY: 1-2x/week  PT DURATION: 6 weeks  PLANNED INTERVENTIONS: Therapeutic exercises, Therapeutic activity, Neuromuscular re-education, Balance training, Gait training, Patient/Family education, Self Care, Joint mobilization, Joint manipulation, Vestibular training, Canalith repositioning, Orthotic/Fit training, DME instructions, Dry Needling, Electrical stimulation, Spinal manipulation, Spinal mobilization, Cryotherapy, Moist heat, Taping, Traction, Ultrasound, Ionotophoresis 4mg /ml Dexamethasone , Manual therapy, and Re-evaluation.  PLAN FOR NEXT SESSION: Mulligan belt techniques to improve tolerance to flexion and ER/IR and for symptom modulation. Psoas stretching. Progressive hip flexor strengthening and hip stabilization.    Venetia Endo, PT, DPT [phone]  Physical Therapist - [facility] 09/15/2023, 4:04 PM

## 2023-09-16 ENCOUNTER — Encounter: Payer: Self-pay | Admitting: Physical Therapy

## 2023-09-17 ENCOUNTER — Ambulatory Visit: Admitting: Physical Therapy

## 2023-09-17 ENCOUNTER — Encounter: Payer: Self-pay | Admitting: Physical Therapy

## 2023-09-17 DIAGNOSIS — M6281 Muscle weakness (generalized): Secondary | ICD-10-CM

## 2023-09-17 DIAGNOSIS — R262 Difficulty in walking, not elsewhere classified: Secondary | ICD-10-CM

## 2023-09-17 DIAGNOSIS — M25551 Pain in right hip: Secondary | ICD-10-CM | POA: Diagnosis not present

## 2023-09-17 NOTE — Therapy (Signed)
 OUTPATIENT PHYSICAL THERAPY TREATMENT   Patient Name: Denise Bolton MRN: [id number] DOB:02/21/1981, 43 y.o., female Today's Date: 09/17/2023  END OF SESSION:  PT End of Session - 09/17/23 1605     Visit Number 9    Number of Visits 13    Date for PT Re-Evaluation 09/24/23    PT Start Time 1604    PT Stop Time 1645    PT Time Calculation (min) 41 min    Activity Tolerance Patient limited by pain;Patient tolerated treatment well    Behavior During Therapy [facility] for tasks assessed/performed            Past Medical History:  Diagnosis Date   Anemia    prior to hysterectomy   BRCA negative 10/2014   MyRisk neg; IBIS=11.1%   Depression    Family history of ovarian cancer    MOTHER   Genetic testing of female 10/2014   IBIS=11.1%   GERD (gastroesophageal reflux disease)    Headache    migraines   Hypertension    h/o-came off meds per md instruction and changed lifestyle/diet and bp now controlled with no meds   Menorrhagia    Past Surgical History:  Procedure Laterality Date   ABDOMINAL HYSTERECTOMY     ABDOMINOPLASTY     ABDOMINOPLASTY  2007   FATTY TISSUE REMOVED   AUGMENTATION MAMMAPLASTY Bilateral 2007   BREAST BIOPSY Right 01/07/2022   stereo bx, ribbon marker, path pending   BREAST BIOPSY Right 01/07/2022   MM RT BREAST BX W LOC DEV 1ST LESION IMAGE BX SPEC STEREO GUIDE 01/07/2022 [facility]   BREAST BIOPSY Right 02/05/2022   MM RT RADIO FREQUENCY TAG LOC MAMMO GUIDE 02/05/2022 [facility]   BREAST LUMPECTOMY WITH RADIO FREQUENCY LOCALIZER Right 02/06/2022   Procedure: BREAST LUMPECTOMY WITH RADIO FREQUENCY LOCALIZER;  Surgeon: Jordis Laneta FALCON, MD;  Location: [facility];  Service: General;  Laterality: Right;   BREAST SURGERY     augmentation   CESAREAN SECTION     x 2   CYSTOSCOPY  01/11/2015   Procedure: CYSTOSCOPY;  Surgeon: Glory High, MD;  Location: [facility];  Service: Gynecology;;   LAPAROSCOPIC BILATERAL SALPINGECTOMY Bilateral  01/11/2015   Procedure: LAPAROSCOPIC BILATERAL SALPINGECTOMY;  Surgeon: Glory High, MD;  Location: [facility];  Service: Gynecology;  Laterality: Bilateral;   LAPAROSCOPIC HYSTERECTOMY N/A 01/11/2015   Procedure: HYSTERECTOMY TOTAL LAPAROSCOPIC;  Surgeon: Glory High, MD;  Location: [facility];  Service: Gynecology;  Laterality: N/A;   Patient Active Problem List   Diagnosis Date Noted   Family history of ovarian cancer 03/18/2022   Atypical lobular hyperplasia [facility]) of right breast 02/06/2022   Annual physical exam 04/08/2019   Recurrent major depressive disorder, in partial remission (HCC) 06/22/2017   S/P laparoscopic hysterectomy 01/11/2015   Iron deficiency anemia 08/22/2014   Palpitations 07/11/2013   Preeclampsia 07/11/2013    PCP: Joshua Cathryne BROCKS, MD (Inactive)  REFERRING PROVIDER: Verlinda Boas, PA-C  REFERRING DIAG: F74.448 (ICD-10-CM) - Pain of right hip   RATIONALE FOR EVALUATION AND TREATMENT: Rehabilitation  THERAPY DIAG: Pain in right hip  Difficulty in walking, not elsewhere classified  Muscle weakness (generalized)  ONSET DATE: Jan 2023  FOLLOW-UP APPT SCHEDULED WITH REFERRING PROVIDER: No f/u scheduled now   SUBJECTIVE:  SUBJECTIVE STATEMENT:  Pt is a 43 year old female with primary complaint of R hip pain; referring diagnosis of R hip OA.   PERTINENT HISTORY: Pt is a 43 year old female with primary complaint of R hip pain; referring diagnosis of R hip OA.   She had an ultrasound-guided cortisone injection in 2023 that only helped slightly. She has been doing activity modification and stretching. She is requesting an order for physical therapy for more stretching and strengthening involving her legs. She is concerned about compensating to the left side. She is still  working and is quite active. She stays away from high-impact activities.   Pt reports that pain began with change in work duties. Pt reports symptoms began with completion of butterfly stretch at night and waking up with notable pain affecting R side. Pt reports continuing with walking regimen - refraining from high impact activity.   History of popping in R hip historically; some remote history of catching/clicking. Pt reports relief of symptoms with Meloxicam and rest. Difficulty with car transfer into mid-size SUV.    PAIN:    Pain Intensity: Present: 0/10, Best: 0/10, Worst: 7/10 Pain location: Anterior hip/groin pain  Pain Quality: burning , sharp with bending toward affected side  Radiating: Yes; pain in R knee also when pushing too far  Numbness/Tingling: No History of buckling: LE can buckle to closed-chain flexion too far  Focal Weakness: No Aggravating factors: trunk/hip flexion, forward bending, bending toward affected hip, high volume of stairs, car transfer Relieving factors: Meloxicam, rest, walking, stretches, hip flexor/lower abdominal exercises 24-hour pain behavior: PM after sitting too long  How long can you sit: How long can you stand: History of prior back injury, pain, surgery, or therapy: Yes; hx of cortisone injection without benefit  Imaging: Yes (see below)  Xrays revealed the right hip with mild femoral head irregularity with inferior femoral neck osteophyte formation. There is no real degenerative joint space narrowing as compared to the left.   Red flags: Negative for bowel/bladder changes, saddle paresthesia, personal history of cancer, h/o spinal tumors, h/o compression fx, h/o abdominal aneurysm, abdominal pain, chills/fever, night sweats, nausea, vomiting, unrelenting pain, first onset of insidious LBP <20 y/o  PRECAUTIONS: None  WEIGHT BEARING RESTRICTIONS: No  FALLS: Has patient fallen in last 6 months? 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  Recent memory is intact.  Remote memory is intact.  Attention span and concentration are intact.  Expressive speech is intact.  Patient's fund of knowledge is within normal limits for educational level.    Gross Musculoskeletal Assessment Tremor: None Bulk: Normal Tone: Normal   GAIT: Distance walked: 30 ft Assistive device utilized: None Level of assistance: Complete Independence Comments: Mild pelvic drop R>L during stance phase, lower extremities externally rotated, increased cadence and decreased stride length   Posture: Lumbar lordosis: WNL Iliac crest height: Equal bilaterally Lumbar lateral shift: Negative  AROM AROM (Normal range in degrees) AROM  08/11/23  Lumbar   Flexion (65) 75% (pain with end-range trunk flexion)  Extension (30) 50% (pulling anterior hip)  Right  lateral flexion (25) WFL  Left lateral flexion (25) WFL  Right rotation (30) 75%  Left rotation (30) 75% (pain with end-range rotation to L)      Hip Right Left  Flexion (125) 90*   Extension (15)    Abduction (40) 30   Adduction     Internal Rotation (45) 32* 35  External Rotation (45) 45 (pain with return to neutral)* WNL      Knee    Flexion (135) WNL   Extension (0) WNL       (* = pain; Blank rows = not tested)  LE MMT: MMT (out of 5) Right 08/11/23 Left 08/11/23  Hip flexion 4-* 4  Hip extension 4+ 5-  Hip abduction (seated) 5 4+ sidelying 5  Hip adduction (seated) 5 5  Hip internal rotation    Hip external rotation    Knee  flexion 5 5  Knee extension 5 5  Ankle dorsiflexion    Ankle plantarflexion    Ankle inversion    Ankle eversion    (* = pain; Blank rows = not tested)  Sensation Deferred  Reflexes Deferred  Muscle Length Thomas (hip flexors): R: Positive for mild 10 deg deficit  L: Not examined Ober: R: Not examined L: Not examined  Palpation TTP R distal illiopsoas 1+ (Blank rows = not tested) Graded on 0-4 scale (0 = no pain, 1 = pain, 2 = pain with wincing/grimacing/flinching, 3 = pain with withdrawal, 4 = unwilling to allow palpation)  Passive Accessory Intervertebral Motion Pt denies reproduction of back pain with CPA L1-L5 and UPA bilaterally L1-L5. Generally, hypomobile throughout  Special Tests Lumbar Radiculopathy and Discogenic: Centralization and Peripheralization (SN 92, -LR 0.12): Not examined Slump (SN 83, -LR 0.32): R: Negative L: Negative SLR (SN 92, -LR 0.29): R: Positive L:  Negative  Lumbar Foraminal Stenosis: Lumbar quadrant (SN 70): R: Negative L: Negative  Hip: FABER (SN 81): R: Positive L: Negative Hip scour (SN 50): R: Positive L: Negative  Labrum: Anterior Labral Tear Test: Positive     TODAY'S TREATMENT: DATE: 09/17/2023   SUBJECTIVE STATEMENT:   Patient reports ongoing same difficulty with OKC/CKC hip flexion and reaching down to feet. Pt reports no pain at rest. Patient reports sometime having more throbbing in anterior hip after gym.    Manual Therapy - for symptom modulation, soft tissue sensitivity and mobility, joint mobility, ROM   Mulligan belt hip mobilization:   -Lateral distraction; 3 x 30 sec bouts with pt in hooklying  -Lateral distraction with hip flexion and ER/IR as tolerated; x 10 reps for each L hip PROM within pt tolerance; x 1 min  Therapeutic Exercise - for improved soft tissue flexibility and extensibility as needed for ROM, improved strength as needed to improve performance of CKC activities/functional movements   Quadruped  rockback;  x 10 with hips slightly externally rotated (< 30 deg); 5 sec hold Quadruped donkey kick; 2 x 10 with each LE   Single-leg bridge on RLE, LLE extended; 2 x 10  Squat at anchored bar, depth to tolerance; 2 x 10   *not today* TRX alternating reverse lunge; 2 x 10 alt R/L Bridge with alternating R/L knee extension; 2 x 6 alt Total Gym, double-limb squats, depth to tolerance; 2 x 10   Neuromuscular Re-education - exercises and drills to promote LE kinetic chain stability and improved proprioception/kinesthesia    BOSU Minisquat; 2 x 10  3-way kick on Airex; x 8 ea dir, bilat LE  PATIENT EDUCATION:  HEP review.   *not today* Single limb stance on Airex; 2 x 30 sec SLS with Rebounder ball toss; 6-lb Medball; x 20 tosses TRX squats (to tolerance) 2 x 15  TRX alternating lateral lunge (depth to tolerance); 2 x 10 alternating R/L 3-way kick; x8 ea dir on each LE; Red Tband around ankles   -standing adjacent to // bars for light unilateral UE support Modified Thomas stretch 2 x 30 seconds Prone Quad stretch, manual; x 60 sec Kneeling hip flexor stretch; 2 x 30 seconds on R  Nustep level 3 x 7 minutes - BLE only -- patient reporting discomfort after minute 5 - seat #7 Knee flexion stretch on 2nd step;  3 x 30 seconds on R     PATIENT EDUCATION:  Education details: see above for patient education details Person educated: Patient Education method: Explanation, Demonstration, and Handouts Education comprehension: verbalized understanding and returned demonstration   HOME EXERCISE PROGRAM:  Access Code: [email or web address] URL: [email or web address] Date: 08/13/2023 Prepared by: Venetia Endo  Exercises - Half Kneeling Hip Flexor Stretch  - 2 x daily - 7 x weekly - 3 sets - 30sec hold - Supine Bridge  - 2 x daily - 7 x weekly - 2 sets - 10 reps - Clamshell  - 2 x daily - 7 x weekly - 2 sets - 10 reps - Quadruped Rocking Backward  - 2 x daily - 7 x weekly - 2  sets - 10 reps - Supine Hip Flexion with Resistance Loop  - 1 x daily - 7 x weekly - 2 sets - 10 reps   ASSESSMENT:  CLINICAL IMPRESSION:   Patient exhibits improving confidence and lessening oscillations with bilateral stance on BOSU ball and no notable LOB with BOSU partial squat. Pt is able to continue with unipedal stance work without notable pain. Pt is most limited by hip flexion in open and closed-chain for RLE. Passive flexion tolerated has modestly improved, but functional limitations with bending over to R side and completing RLE self-care ADLs remain. Pt has remaining deficits in: bending, reaching to ground, car transfers, higher volume of stair negotiation, R hip stiffness and motion loss, hip flexor/psoas tightness, decreased hip flexor strength and minimal hip extensor weakness, taut/tender distal iliopsoas, and gait changes (see above). Pt will continue to benefit from skilled PT services to address deficits and improve function.   OBJECTIVE IMPAIRMENTS: Abnormal gait, decreased mobility, difficulty walking, decreased ROM, decreased strength, hypomobility, impaired flexibility, and pain.   ACTIVITY LIMITATIONS: bending, sitting, squatting, sleeping, stairs, transfers, and bed mobility  PARTICIPATION LIMITATIONS: driving, community activity, and occupation  PERSONAL FACTORS: Past/current experiences, Time since onset of injury/illness/exacerbation, and 1-2 comorbidities: (HTN, depression) are also affecting patient's functional outcome.   REHAB POTENTIAL: Good  CLINICAL DECISION MAKING: Evolving/moderate complexity  EVALUATION COMPLEXITY: Moderate   GOALS: Goals reviewed with patient? Yes  SHORT TERM GOALS: Target date: 09/01/2023  Pt will be independent with HEP in order to improve strength and decrease back pain to improve pain-free function at home and work. Baseline: 08/11/23: Baseline HEP initiated.  Goal status: INITIAL   LONG TERM GOALS: Target date:  09/24/2023  Pt will have R hip flexion to 120 deg or greater and ABD to 40 deg or greater without reproduction of groin pain as needed for bed mobility tasks, ability to complete bending lower to ground, and intermittently for self-care ADLs Baseline: 08/11/23: Motion loss with R hip flexion, ER, IR, ABD. Goal status: INITIAL  2.  Pt will decrease worst hip pain by at least 3 points on the NPRS in order to demonstrate clinically significant reduction in back pain. Baseline: 08/11/23: 7/10 at worst Goal status: INITIAL  3.  Pt will improve LEFS by 10 points or greater indicative of clinically meaningful change in pt function   Baseline: 08/11/23: 61/80 Goal status: INITIAL  4.  Patient will negotiate full flight of steps with reciprocal pattern and no significant buckling or movement deviations without reproduction of hip pain.  Baseline: 08/11/23: limited with higher volume of stair climbing.  Goal status: INITIAL  5.  Patient will tolerate full trunk flexion in standing without reproduction of groin pain as needed for reaching to ground and intermittently for self-care ADLs Baseline: 08/11/23: Limited trunk flexion with groin pain.  Goal status: INITIAL   PLAN: PT FREQUENCY: 1-2x/week  PT DURATION: 6 weeks  PLANNED INTERVENTIONS: Therapeutic exercises, Therapeutic activity, Neuromuscular re-education, Balance training, Gait training, Patient/Family education, Self Care, Joint mobilization, Joint manipulation, Vestibular training, Canalith repositioning, Orthotic/Fit training, DME instructions, Dry Needling, Electrical stimulation, Spinal manipulation, Spinal mobilization, Cryotherapy, Moist heat, Taping, Traction, Ultrasound, Ionotophoresis 4mg /ml Dexamethasone , Manual therapy, and Re-evaluation.  PLAN FOR NEXT SESSION: Mulligan belt techniques to improve tolerance to flexion and ER/IR and for symptom modulation. Psoas stretching. Progressive hip flexor strengthening and hip stabilization.     Venetia Endo, PT, DPT [phone]  Physical Therapist - [facility] 09/17/2023, 4:05 PM

## 2023-09-22 ENCOUNTER — Ambulatory Visit: Admitting: Physical Therapy

## 2023-09-22 DIAGNOSIS — M6281 Muscle weakness (generalized): Secondary | ICD-10-CM | POA: Diagnosis not present

## 2023-09-22 DIAGNOSIS — M25551 Pain in right hip: Secondary | ICD-10-CM | POA: Diagnosis not present

## 2023-09-22 DIAGNOSIS — R262 Difficulty in walking, not elsewhere classified: Secondary | ICD-10-CM

## 2023-09-22 NOTE — Therapy (Unsigned)
 OUTPATIENT PHYSICAL THERAPY TREATMENT AND PROGRESS NOTE   Dates of reporting period  08/11/23   to   09/22/23   Patient Name: Denise Bolton MRN: [id number] DOB:[birth year], 43 y.o., female Today's Date: 09/22/2023  END OF SESSION:  PT End of Session - 09/22/23 1601     Visit Number 10    Number of Visits 13    Date for PT Re-Evaluation 09/24/23    PT Start Time 1602    PT Stop Time 1643    PT Time Calculation (min) 41 min    Activity Tolerance Patient limited by pain;Patient tolerated treatment well    Behavior During Therapy [facility] for tasks assessed/performed           Past Medical History:  Diagnosis Date   Anemia    prior to hysterectomy   BRCA negative 10/2014   MyRisk neg; IBIS=11.1%   Depression    Family history of ovarian cancer    MOTHER   Genetic testing of female 10/2014   IBIS=11.1%   GERD (gastroesophageal reflux disease)    Headache    migraines   Hypertension    h/o-came off meds per md instruction and changed lifestyle/diet and bp now controlled with no meds   Menorrhagia    Past Surgical History:  Procedure Laterality Date   ABDOMINAL HYSTERECTOMY     ABDOMINOPLASTY     ABDOMINOPLASTY  2007   FATTY TISSUE REMOVED   AUGMENTATION MAMMAPLASTY Bilateral 2007   BREAST BIOPSY Right 01/07/2022   stereo bx, ribbon marker, path pending   BREAST BIOPSY Right 01/07/2022   MM RT BREAST BX W LOC DEV 1ST LESION IMAGE BX SPEC STEREO GUIDE 01/07/2022 [facility]   BREAST BIOPSY Right 02/05/2022   MM RT RADIO FREQUENCY TAG LOC MAMMO GUIDE 02/05/2022 [facility]   BREAST LUMPECTOMY WITH RADIO FREQUENCY LOCALIZER Right 02/06/2022   Procedure: BREAST LUMPECTOMY WITH RADIO FREQUENCY LOCALIZER;  Surgeon: Jordis Laneta FALCON, MD;  Location: [facility];  Service: General;  Laterality: Right;   BREAST SURGERY     augmentation   CESAREAN SECTION     x 2   CYSTOSCOPY  01/11/2015   Procedure: CYSTOSCOPY;  Surgeon: Glory High, MD;  Location: [facility];  Service:  Gynecology;;   LAPAROSCOPIC BILATERAL SALPINGECTOMY Bilateral 01/11/2015   Procedure: LAPAROSCOPIC BILATERAL SALPINGECTOMY;  Surgeon: Glory High, MD;  Location: [facility];  Service: Gynecology;  Laterality: Bilateral;   LAPAROSCOPIC HYSTERECTOMY N/A 01/11/2015   Procedure: HYSTERECTOMY TOTAL LAPAROSCOPIC;  Surgeon: Glory High, MD;  Location: [facility];  Service: Gynecology;  Laterality: N/A;   Patient Active Problem List   Diagnosis Date Noted   Family history of ovarian cancer 03/18/2022   Atypical lobular hyperplasia [facility]) of right breast 02/06/2022   Annual physical exam 04/08/2019   Recurrent major depressive disorder, in partial remission (HCC) 06/22/2017   S/P laparoscopic hysterectomy 01/11/2015   Iron deficiency anemia 08/22/2014   Palpitations 07/11/2013   Preeclampsia 07/11/2013    PCP: Joshua Cathryne BROCKS, MD (Inactive)  REFERRING PROVIDER: Verlinda Boas, PA-C  REFERRING DIAG: F74.448 (ICD-10-CM) - Pain of right hip   RATIONALE FOR EVALUATION AND TREATMENT: Rehabilitation  THERAPY DIAG: Pain in right hip  Difficulty in walking, not elsewhere classified  Muscle weakness (generalized)  ONSET DATE: Jan 2023  FOLLOW-UP APPT SCHEDULED WITH REFERRING PROVIDER: No f/u scheduled now   SUBJECTIVE:  SUBJECTIVE STATEMENT:  Pt is a 43 year old female with primary complaint of R hip pain; referring diagnosis of R hip OA.   PERTINENT HISTORY: Pt is a 43 year old female with primary complaint of R hip pain; referring diagnosis of R hip OA.   She had an ultrasound-guided cortisone injection in 2023 that only helped slightly. She has been doing activity modification and stretching. She is requesting an order for physical therapy for more stretching and strengthening involving her legs. She is  concerned about compensating to the left side. She is still working and is quite active. She stays away from high-impact activities.   Pt reports that pain began with change in work duties. Pt reports symptoms began with completion of butterfly stretch at night and waking up with notable pain affecting R side. Pt reports continuing with walking regimen - refraining from high impact activity.   History of popping in R hip historically; some remote history of catching/clicking. Pt reports relief of symptoms with Meloxicam and rest. Difficulty with car transfer into mid-size SUV.    PAIN:    Pain Intensity: Present: 0/10, Best: 0/10, Worst: 7/10 Pain location: Anterior hip/groin pain  Pain Quality: burning , sharp with bending toward affected side  Radiating: Yes; pain in R knee also when pushing too far  Numbness/Tingling: No History of buckling: LE can buckle to closed-chain flexion too far  Focal Weakness: No Aggravating factors: trunk/hip flexion, forward bending, bending toward affected hip, high volume of stairs, car transfer Relieving factors: Meloxicam, rest, walking, stretches, hip flexor/lower abdominal exercises 24-hour pain behavior: PM after sitting too long  How long can you sit: How long can you stand: History of prior back injury, pain, surgery, or therapy: Yes; hx of cortisone injection without benefit  Imaging: Yes (see below)  Xrays revealed the right hip with mild femoral head irregularity with inferior femoral neck osteophyte formation. There is no real degenerative joint space narrowing as compared to the left.   Red flags: Negative for bowel/bladder changes, saddle paresthesia, personal history of cancer, h/o spinal tumors, h/o compression fx, h/o abdominal aneurysm, abdominal pain, chills/fever, night sweats, nausea, vomiting, unrelenting pain, first onset of insidious LBP <20 y/o  PRECAUTIONS: None  WEIGHT BEARING RESTRICTIONS: No  FALLS: Has patient fallen in  last 6 months? 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, civil (consulting))  Hobbies: Pt wants to skydive next year  Patient Goals: Improved mobility and strengthening     OBJECTIVE (data from initial evaluation unless otherwise dated):   Patient Surveys  LEFS 61/80 = 76.3%  GAIT: Distance walked: 30 ft Assistive device utilized: None Level of assistance: Complete Independence Comments: Mild pelvic drop R>L during stance phase, lower extremities externally rotated, increased cadence and decreased stride length   AROM AROM (Normal range in degrees) AROM  08/11/23 AROM 09/22/23  Lumbar     Flexion (65) 75% (pain with end-range trunk flexion)    Extension (30) 50% (pulling anterior hip)    Right lateral flexion (25) WFL    Left lateral flexion (25) WFL    Right rotation (30) 75%    Left rotation (30) 75% (pain with end-range rotation to L)          Hip Right Left Right Left  Flexion (125) 90*  108   Extension (  15)      Abduction (40) 30  38   Adduction       Internal Rotation (45) 32* 35 27   External Rotation (45) 45 (pain with return to neutral)* WNL 45         Knee      Flexion (135) WNL     Extension (0) WNL           (* = pain; Blank rows = not tested)  LE MMT: MMT (out of 5) Right 08/11/23 Left 08/11/23  Hip flexion 4-* 4  Hip extension 4+ 5-  Hip abduction (seated) 5 4+ sidelying 5  Hip adduction (seated) 5 5  Hip internal rotation    Hip external rotation    Knee flexion 5 5  Knee extension 5 5  Ankle dorsiflexion    Ankle plantarflexion    Ankle inversion    Ankle eversion    (* = pain; Blank rows = not tested)  Sensation Deferred  Reflexes Deferred  Muscle Length Thomas (hip flexors): R: Positive for mild 10 deg  deficit  L: Not examined Ober: R: Not examined L: Not examined  Palpation TTP R distal illiopsoas 1+ (Blank rows = not tested) Graded on 0-4 scale (0 = no pain, 1 = pain, 2 = pain with wincing/grimacing/flinching, 3 = pain with withdrawal, 4 = unwilling to allow palpation)  Passive Accessory Intervertebral Motion Pt denies reproduction of back pain with CPA L1-L5 and UPA bilaterally L1-L5. Generally, hypomobile throughout  Special Tests Lumbar Radiculopathy and Discogenic: Centralization and Peripheralization (SN 92, -LR 0.12): Not examined Slump (SN 83, -LR 0.32): R: Negative L: Negative SLR (SN 92, -LR 0.29): R: Positive L:  Negative  Lumbar Foraminal Stenosis: Lumbar quadrant (SN 70): R: Negative L: Negative  Hip: FABER (SN 81): R: Positive L: Negative Hip scour (SN 50): R: Positive L: Negative  Labrum: Anterior Labral Tear Test: Positive     TODAY'S TREATMENT: DATE: 09/22/2023   SUBJECTIVE STATEMENT:   Patient reports feeling generally well at arrival. She reports getting moderate improvement in forward flexion ROM as needed for reaching feet - throbbing pain during this activity. 80% global rating of function to date. Patient reports having miserable pain before, and she feels that her condition has markedly improved. Pt denies disturbed sleep related to hip pain. Pt reports doing well with her standing computer desk; pt refrains from leaning forward excessively. Pt feels that PT has helped.     *GOAL UPDATE PERFORMED   Manual Therapy - for symptom modulation, soft tissue sensitivity and mobility, joint mobility, ROM   Mulligan belt hip mobilization:   -Lateral distraction; 3 x 30 sec bouts with pt in hooklying  -Lateral/inferolateral distraction with hip flexion and ER/IR as tolerated; x 10 reps for each R hip PROM within pt tolerance; x 1 min   Therapeutic Exercise - for improved soft tissue flexibility and extensibility as needed for ROM, improved strength as  needed to improve performance of CKC activities/functional movements   *not today* Quadruped rockback;  x 10 with hips slightly externally rotated (< 30 deg); 5 sec hold Quadruped donkey kick; 2 x 10 with each LE  Single-leg bridge on RLE, LLE extended; 2 x 10 Squat at anchored bar, depth to tolerance; 2 x 10 TRX alternating reverse lunge; 2 x 10 alt R/L Bridge with alternating R/L knee extension; 2 x 6 alt Total Gym, double-limb squats, depth to tolerance; 2 x 10   Neuromuscular Re-education - exercises and  drills to promote LE kinetic chain stability and improved proprioception/kinesthesia   *not today* BOSU Minisquat; 2 x 10 3-way kick on Airex; x 8 ea dir, bilat LE Single limb stance on Airex; 2 x 30 sec SLS with Rebounder ball toss; 6-lb Medball; x 20 tosses TRX squats (to tolerance) 2 x 15  TRX alternating lateral lunge (depth to tolerance); 2 x 10 alternating R/L 3-way kick; x8 ea dir on each LE; Red Tband around ankles   -standing adjacent to // bars for light unilateral UE support Modified Thomas stretch 2 x 30 seconds Prone Quad stretch, manual; x 60 sec Kneeling hip flexor stretch; 2 x 30 seconds on R  Nustep level 3 x 7 minutes - BLE only -- patient reporting discomfort after minute 5 - seat #7 Knee flexion stretch on 2nd step;  3 x 30 seconds on R     PATIENT EDUCATION:  Education details: see above for patient education details Person educated: Patient Education method: Explanation, Demonstration, and Handouts Education comprehension: verbalized understanding and returned demonstration   HOME EXERCISE PROGRAM:  Access Code: [email or web address] URL: [email or web address] Date: 08/13/2023 Prepared by: Venetia Endo  Exercises - Half Kneeling Hip Flexor Stretch  - 2 x daily - 7 x weekly - 3 sets - 30sec hold - Supine Bridge  - 2 x daily - 7 x weekly - 2 sets - 10 reps - Clamshell  - 2 x daily - 7 x weekly - 2 sets - 10 reps - Quadruped Rocking  Backward  - 2 x daily - 7 x weekly - 2 sets - 10 reps - Supine Hip Flexion with Resistance Loop  - 1 x daily - 7 x weekly - 2 sets - 10 reps   ASSESSMENT:  CLINICAL IMPRESSION:   Patient exhibits markedly improved ability to perform forward flexion as needed for reaching to the ground and completing self-care ADLs. Hip ROM has modestly improved, but pt is still notably limited with hip flexion as needed for some self-care ADLs. Pt has most pain with [place] hip flexion AROM. With current progress, she has fortunately met LEFS goal and stair negotiation goal. Pt completes general functional mobility tasks very well and is able to complete squat with hips in higher degree of ER (we previously discussed individual differences in hip morphology and some individuals squatting more comfortably in abducted/externally rotated position). 3/10 pain at worst over previous week. Pt feels that she can likely resume home-based and gym-based exercise following this week. Given current progress and PT goals being largely met, pt is appropriate for this transition. Pt has remaining deficits in: bending, reaching to ground, car transfers, higher volume of stair negotiation, R hip stiffness and motion loss, hip flexor/psoas tightness, decreased hip flexor strength and minimal hip extensor weakness, taut/tender distal iliopsoas. Pt will continue to benefit from skilled PT services to address deficits and improve function.   OBJECTIVE IMPAIRMENTS: Abnormal gait, decreased mobility, difficulty walking, decreased ROM, decreased strength, hypomobility, impaired flexibility, and pain.   ACTIVITY LIMITATIONS: bending, sitting, squatting, sleeping, stairs, transfers, and bed mobility  PARTICIPATION LIMITATIONS: driving, community activity, and occupation  PERSONAL FACTORS: Past/current experiences, Time since onset of injury/illness/exacerbation, and 1-2 comorbidities: (HTN, depression) are also affecting patient's functional  outcome.   REHAB POTENTIAL: Good  CLINICAL DECISION MAKING: Evolving/moderate complexity  EVALUATION COMPLEXITY: Moderate   GOALS: Goals reviewed with patient? Yes  SHORT TERM GOALS: Target date: 09/01/2023  Pt will be independent with HEP in order to improve strength and decrease  back pain to improve pain-free function at home and work. Baseline: 08/11/23: Baseline HEP initiated.   09/22/23: Pt reports she is usually compliant with her HEP.  Goal status: ACHIEVED   LONG TERM GOALS: Target date: 09/24/2023  Pt will have R hip flexion to 120 deg or greater and ABD to 40 deg or greater without reproduction of groin pain as needed for bed mobility tasks, ability to complete bending lower to ground, and intermittently for self-care ADLs Baseline: 08/11/23: Motion loss with R hip flexion, ER, IR, ABD.   09/22/23: Improved for flexion, met for ABD.  Goal status: IN PROGRESS  2.  Pt will decrease worst hip pain by at least 3 points on the NPRS in order to demonstrate clinically significant reduction in back pain. Baseline: 08/11/23: 7/10 at worst.   09/22/23: 3/10 at worst.  Goal status: ACHIEVED   3.  Pt will improve LEFS by 10 points or greater indicative of clinically meaningful change in pt function   Baseline: 08/11/23: 61/80   09/22/23: 75/80 Goal status: ACHIEVED  4.  Patient will negotiate full flight of steps with reciprocal pattern and no significant buckling or movement deviations without reproduction of hip pain.  Baseline: 08/11/23: limited with higher volume of stair climbing.      09/22/23: Pt negotiates steps safely without buckling, mild discomfort with raising RLE to reach next step.  Goal status: ACHIEVED  5.  Patient will tolerate full trunk flexion in standing without reproduction of groin pain as needed for reaching to ground and intermittently for self-care ADLs Baseline: 08/11/23: Limited trunk flexion with groin pain.      09/22/23: Pt completes trunk flexion to ankles  without reproduction of hip pain.  Goal status: ACHIEVED   PLAN: PT FREQUENCY: 1-2x/week  PT DURATION: 1-2 weeks  PLANNED INTERVENTIONS: Therapeutic exercises, Therapeutic activity, Neuromuscular re-education, Balance training, Gait training, Patient/Family education, Self Care, Joint mobilization, Joint manipulation, Vestibular training, Canalith repositioning, Orthotic/Fit training, DME instructions, Dry Needling, Electrical stimulation, Spinal manipulation, Spinal mobilization, Cryotherapy, Moist heat, Taping, Traction, Ultrasound, Ionotophoresis 4mg /ml Dexamethasone , Manual therapy, and Re-evaluation.  PLAN FOR NEXT SESSION: Progress HEP and work toward maintenance program with remaining PT visit.    Venetia Endo, PT, DPT [phone]  Physical Therapist - [facility] 09/22/2023, 4:02 PM

## 2023-09-23 ENCOUNTER — Encounter: Payer: Self-pay | Admitting: Physical Therapy

## 2023-09-24 ENCOUNTER — Ambulatory Visit: Admitting: Physical Therapy

## 2023-09-24 DIAGNOSIS — M25551 Pain in right hip: Secondary | ICD-10-CM

## 2023-09-24 DIAGNOSIS — M6281 Muscle weakness (generalized): Secondary | ICD-10-CM | POA: Diagnosis not present

## 2023-09-24 DIAGNOSIS — R262 Difficulty in walking, not elsewhere classified: Secondary | ICD-10-CM | POA: Diagnosis not present

## 2023-09-24 NOTE — Therapy (Addendum)
 OUTPATIENT PHYSICAL THERAPY TREATMENT AND DISCHARGE   Patient Name: Denise Bolton MRN: [id number] DOB:Mar 22, 1980, 43 y.o., female Today's Date: 09/24/2023  END OF SESSION:  PT End of Session - 09/24/23 1612     Visit Number 11    Number of Visits 13    Date for PT Re-Evaluation 09/24/23    PT Start Time 1610    PT Stop Time 1650    PT Time Calculation (min) 40 min    Activity Tolerance Patient limited by pain;Patient tolerated treatment well    Behavior During Therapy [facility] for tasks assessed/performed            Past Medical History:  Diagnosis Date   Anemia    prior to hysterectomy   BRCA negative 10/2014   MyRisk neg; IBIS=11.1%   Depression    Family history of ovarian cancer    MOTHER   Genetic testing of female 10/2014   IBIS=11.1%   GERD (gastroesophageal reflux disease)    Headache    migraines   Hypertension    h/o-came off meds per md instruction and changed lifestyle/diet and bp now controlled with no meds   Menorrhagia    Past Surgical History:  Procedure Laterality Date   ABDOMINAL HYSTERECTOMY     ABDOMINOPLASTY     ABDOMINOPLASTY  2007   FATTY TISSUE REMOVED   AUGMENTATION MAMMAPLASTY Bilateral 2007   BREAST BIOPSY Right 01/07/2022   stereo bx, ribbon marker, path pending   BREAST BIOPSY Right 01/07/2022   MM RT BREAST BX W LOC DEV 1ST LESION IMAGE BX SPEC STEREO GUIDE 01/07/2022 [facility]   BREAST BIOPSY Right 02/05/2022   MM RT RADIO FREQUENCY TAG LOC MAMMO GUIDE 02/05/2022 [facility]   BREAST LUMPECTOMY WITH RADIO FREQUENCY LOCALIZER Right 02/06/2022   Procedure: BREAST LUMPECTOMY WITH RADIO FREQUENCY LOCALIZER;  Surgeon: Jordis Laneta FALCON, MD;  Location: [facility];  Service: General;  Laterality: Right;   BREAST SURGERY     augmentation   CESAREAN SECTION     x 2   CYSTOSCOPY  01/11/2015   Procedure: CYSTOSCOPY;  Surgeon: Glory High, MD;  Location: [facility];  Service: Gynecology;;   LAPAROSCOPIC BILATERAL SALPINGECTOMY  Bilateral 01/11/2015   Procedure: LAPAROSCOPIC BILATERAL SALPINGECTOMY;  Surgeon: Glory High, MD;  Location: [facility];  Service: Gynecology;  Laterality: Bilateral;   LAPAROSCOPIC HYSTERECTOMY N/A 01/11/2015   Procedure: HYSTERECTOMY TOTAL LAPAROSCOPIC;  Surgeon: Glory High, MD;  Location: [facility];  Service: Gynecology;  Laterality: N/A;   Patient Active Problem List   Diagnosis Date Noted   Family history of ovarian cancer 03/18/2022   Atypical lobular hyperplasia [facility]) of right breast 02/06/2022   Annual physical exam 04/08/2019   Recurrent major depressive disorder, in partial remission (HCC) 06/22/2017   S/P laparoscopic hysterectomy 01/11/2015   Iron deficiency anemia 08/22/2014   Palpitations 07/11/2013   Preeclampsia 07/11/2013    PCP: Joshua Cathryne BROCKS, MD (Inactive)  REFERRING PROVIDER: Verlinda Boas, PA-C  REFERRING DIAG: F74.448 (ICD-10-CM) - Pain of right hip   RATIONALE FOR EVALUATION AND TREATMENT: Rehabilitation  THERAPY DIAG: Pain in right hip  Difficulty in walking, not elsewhere classified  Muscle weakness (generalized)  ONSET DATE: Jan 2023  FOLLOW-UP APPT SCHEDULED WITH REFERRING PROVIDER: No f/u scheduled now   SUBJECTIVE:  SUBJECTIVE STATEMENT:  Pt is a 43 year old female with primary complaint of R hip pain; referring diagnosis of R hip OA.   PERTINENT HISTORY: Pt is a 43 year old female with primary complaint of R hip pain; referring diagnosis of R hip OA.   She had an ultrasound-guided cortisone injection in 2023 that only helped slightly. She has been doing activity modification and stretching. She is requesting an order for physical therapy for more stretching and strengthening involving her legs. She is concerned about compensating to the left side. She is  still working and is quite active. She stays away from high-impact activities.   Pt reports that pain began with change in work duties. Pt reports symptoms began with completion of butterfly stretch at night and waking up with notable pain affecting R side. Pt reports continuing with walking regimen - refraining from high impact activity.   History of popping in R hip historically; some remote history of catching/clicking. Pt reports relief of symptoms with Meloxicam and rest. Difficulty with car transfer into mid-size SUV.    PAIN:    Pain Intensity: Present: 0/10, Best: 0/10, Worst: 7/10 Pain location: Anterior hip/groin pain  Pain Quality: burning , sharp with bending toward affected side  Radiating: Yes; pain in R knee also when pushing too far  Numbness/Tingling: No History of buckling: LE can buckle to closed-chain flexion too far  Focal Weakness: No Aggravating factors: trunk/hip flexion, forward bending, bending toward affected hip, high volume of stairs, car transfer Relieving factors: Meloxicam, rest, walking, stretches, hip flexor/lower abdominal exercises 24-hour pain behavior: PM after sitting too long  How long can you sit: How long can you stand: History of prior back injury, pain, surgery, or therapy: Yes; hx of cortisone injection without benefit  Imaging: Yes (see below)  Xrays revealed the right hip with mild femoral head irregularity with inferior femoral neck osteophyte formation. There is no real degenerative joint space narrowing as compared to the left.   Red flags: Negative for bowel/bladder changes, saddle paresthesia, personal history of cancer, h/o spinal tumors, h/o compression fx, h/o abdominal aneurysm, abdominal pain, chills/fever, night sweats, nausea, vomiting, unrelenting pain, first onset of insidious LBP <20 y/o  PRECAUTIONS: None  WEIGHT BEARING RESTRICTIONS: No  FALLS: Has patient fallen in last 6 months? 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, civil (consulting))  Hobbies: Pt wants to skydive next year  Patient Goals: Improved mobility and strengthening     OBJECTIVE (data from initial evaluation unless otherwise dated):   Patient Surveys  LEFS 61/80 = 76.3%  GAIT: Distance walked: 30 ft Assistive device utilized: None Level of assistance: Complete Independence Comments: Mild pelvic drop R>L during stance phase, lower extremities externally rotated, increased cadence and decreased stride length   AROM AROM (Normal range in degrees) AROM  08/11/23 AROM 09/22/23  Lumbar     Flexion (65) 75% (pain with end-range trunk flexion)    Extension (30) 50% (pulling anterior hip)    Right lateral flexion (25) WFL    Left lateral flexion (25) WFL    Right rotation (30) 75%    Left rotation (30) 75% (pain with end-range rotation to L)          Hip Right Left Right Left  Flexion (125) 90*  108   Extension (  15)      Abduction (40) 30  38   Adduction       Internal Rotation (45) 32* 35 27   External Rotation (45) 45 (pain with return to neutral)* WNL 45         Knee      Flexion (135) WNL     Extension (0) WNL           (* = pain; Blank rows = not tested)  LE MMT: MMT (out of 5) Right 08/11/23 Left 08/11/23  Hip flexion 4-* 4  Hip extension 4+ 5-  Hip abduction (seated) 5 4+ sidelying 5  Hip adduction (seated) 5 5  Hip internal rotation    Hip external rotation    Knee flexion 5 5  Knee extension 5 5  Ankle dorsiflexion    Ankle plantarflexion    Ankle inversion    Ankle eversion    (* = pain; Blank rows = not tested)  Sensation Deferred  Reflexes Deferred  Muscle Length Thomas (hip flexors): R: Positive for mild 10 deg deficit  L: Not examined Ober: R: Not examined L: Not  examined  Palpation TTP R distal illiopsoas 1+ (Blank rows = not tested) Graded on 0-4 scale (0 = no pain, 1 = pain, 2 = pain with wincing/grimacing/flinching, 3 = pain with withdrawal, 4 = unwilling to allow palpation)  Passive Accessory Intervertebral Motion Pt denies reproduction of back pain with CPA L1-L5 and UPA bilaterally L1-L5. Generally, hypomobile throughout  Special Tests Lumbar Radiculopathy and Discogenic: Centralization and Peripheralization (SN 92, -LR 0.12): Not examined Slump (SN 83, -LR 0.32): R: Negative L: Negative SLR (SN 92, -LR 0.29): R: Positive L:  Negative  Lumbar Foraminal Stenosis: Lumbar quadrant (SN 70): R: Negative L: Negative  Hip: FABER (SN 81): R: Positive L: Negative Hip scour (SN 50): R: Positive L: Negative  Labrum: Anterior Labral Tear Test: Positive     TODAY'S TREATMENT: DATE: 09/24/2023   SUBJECTIVE STATEMENT:   Patient reports doing well with general ADLs and functional mobility tasks. She feels that has made notable progress, but she does have ongoing deficit with forward flexion toward affected LE. She feels she is ready to continue with home-based program following today's visit.      Manual Therapy - for symptom modulation, soft tissue sensitivity and mobility, joint mobility, ROM   Mulligan belt hip mobilization:   -Lateral distraction; 3 x 30 sec bouts with pt in hooklying  -Lateral/inferolateral distraction with hip flexion and ER/IR as tolerated; x 10 reps for each R hip PROM within pt tolerance; x 1 min   Therapeutic Exercise - for improved soft tissue flexibility and extensibility as needed for ROM, improved strength as needed to improve performance of CKC activities/functional movements  TRX alternating reverse lunge; 2 x 10 alt R/L Monster walk with Blue Tband; 4x D/B along blue agility ladder Hooklying alternating resisted hip flexion with Green Tband loop; 1 x 10 alt R/L   *not today* Quadruped rockback;  x 10  with hips slightly externally rotated (< 30 deg); 5 sec hold Quadruped donkey kick; 2 x 10 with each LE  Single-leg bridge on RLE, LLE extended; 2 x 10 Squat at anchored bar, depth to tolerance; 2 x 10 Bridge with alternating R/L knee extension; 2 x 6 alt Total Gym, double-limb squats, depth to tolerance; 2 x 10   Neuromuscular Re-education - exercises and drills to promote LE kinetic chain stability and improved proprioception/kinesthesia  3-way kick  on Airex; x 8 ea dir, bilat LE  -Green Tband around ankles  Single limb stance on Airex; reviewed   *not today* TRX squats (to tolerance) 2 x 15  TRX alternating lateral lunge (depth to tolerance); 2 x 10 alternating R/L 3-way kick; x8 ea dir on each LE; Red Tband around ankles   -standing adjacent to // bars for light unilateral UE support Modified Thomas stretch 2 x 30 seconds Prone Quad stretch, manual; x 60 sec Kneeling hip flexor stretch; 2 x 30 seconds on R  Nustep level 3 x 7 minutes - BLE only -- patient reporting discomfort after minute 5 - seat #7 Knee flexion stretch on 2nd step;  3 x 30 seconds on R     PATIENT EDUCATION:  Education details: HEP updated and reviewed. New [employer] handout provided (see program below) Person educated: Patient Education method: Explanation, Demonstration, and Handouts Education comprehension: verbalized understanding and returned demonstration   HOME EXERCISE PROGRAM:  Access Code: [email or web address] URL: [email or web address] Date: 09/25/2023 Prepared by: Venetia Endo  Exercises - Half Kneeling Hip Flexor Stretch  - 2 x daily - 7 x weekly - 3 sets - 30sec hold - Prone Quadriceps Stretch with Strap  - 2 x daily - 7 x weekly - 3 sets - 30sec hold - Clamshell  - 2 x daily - 7 x weekly - 2 sets - 10 reps - Quadruped Rocking Backward  - 2 x daily - 7 x weekly - 2 sets - 10 reps - Supine Hip Flexion with Resistance Loop  - 1 x daily - 7 x weekly - 2 sets - 10 reps - Single Leg  Bridge  - 1 x daily - 4 x weekly - 2 sets - 10 reps - Standing 3-Way Kick  - 1 x daily - 4 x weekly - 2 sets - 10 reps - Side Stepping with Resistance at Thighs  - 1 x daily - 4 x weekly - 2 sets - 10 reps - Single Leg Stance on Foam Pad  - 1 x daily - 4 x weekly - 23 sets - 30sec hold   ASSESSMENT:  CLINICAL IMPRESSION:   Patient has met or mostly met established PT goals and subjectively feels that has made marked improvement with PT in spite of ongoing deficit with forward flexion to R side and with open-chain flexion to raise RLE during obstacle negotiation and self-care tasks. She is able to complete functional squat with higher degree of ER/abduction - we have discussed morphological differences in the acetabulum and how this can impact comfort with squatting/kneeling at different angles of hip rotation. Pt is able to complete community-level gait and stair negotiation without marked deficit. She has met PT goals for LEFS goal and pain rating scale. Pt has moderate deficit remaining with R hip ROM, though objective measurement has apparently improved. Given current progress and pt's readiness to continue with HEP/gym-based exercise, pt is appropriate for discharge to independent exercise program. Pt may f/u with PT if her condition regresses or if her progress moving forward is suboptimal.     OBJECTIVE IMPAIRMENTS: Abnormal gait, decreased mobility, difficulty walking, decreased ROM, decreased strength, hypomobility, impaired flexibility, and pain.   ACTIVITY LIMITATIONS: bending, sitting, squatting, sleeping, stairs, transfers, and bed mobility  PARTICIPATION LIMITATIONS: driving, community activity, and occupation  PERSONAL FACTORS: Past/current experiences, Time since onset of injury/illness/exacerbation, and 1-2 comorbidities: (HTN, depression) are also affecting patient's functional outcome.   REHAB POTENTIAL: Good  CLINICAL DECISION MAKING: Evolving/moderate  complexity  EVALUATION COMPLEXITY: Moderate   GOALS: Goals reviewed with patient? Yes  SHORT TERM GOALS: Target date: 09/01/2023  Pt will be independent with HEP in order to improve strength and decrease back pain to improve pain-free function at home and work. Baseline: 08/11/23: Baseline HEP initiated.   09/22/23: Pt reports she is usually compliant with her HEP.  Goal status: ACHIEVED   LONG TERM GOALS: Target date: 09/24/2023  Pt will have R hip flexion to 120 deg or greater and ABD to 40 deg or greater without reproduction of groin pain as needed for bed mobility tasks, ability to complete bending lower to ground, and intermittently for self-care ADLs Baseline: 08/11/23: Motion loss with R hip flexion, ER, IR, ABD.   09/22/23: Improved for flexion, met for ABD.  Goal status: IN PROGRESS  2.  Pt will decrease worst hip pain by at least 3 points on the NPRS in order to demonstrate clinically significant reduction in back pain. Baseline: 08/11/23: 7/10 at worst.   09/22/23: 3/10 at worst.  Goal status: ACHIEVED   3.  Pt will improve LEFS by 10 points or greater indicative of clinically meaningful change in pt function   Baseline: 08/11/23: 61/80   09/22/23: 75/80 Goal status: ACHIEVED  4.  Patient will negotiate full flight of steps with reciprocal pattern and no significant buckling or movement deviations without reproduction of hip pain.  Baseline: 08/11/23: limited with higher volume of stair climbing.      09/22/23: Pt negotiates steps safely without buckling, mild discomfort with raising RLE to reach next step.  Goal status: ACHIEVED  5.  Patient will tolerate full trunk flexion in standing without reproduction of groin pain as needed for reaching to ground and intermittently for self-care ADLs Baseline: 08/11/23: Limited trunk flexion with groin pain.      09/22/23: Pt completes trunk flexion to ankles without reproduction of hip pain.  Goal status: ACHIEVED   PLAN: PT FREQUENCY:  -  PT DURATION: -  PLANNED INTERVENTIONS: Therapeutic exercises, Therapeutic activity, Neuromuscular re-education, Balance training, Gait training, Patient/Family education, Self Care, Joint mobilization, Joint manipulation, Vestibular training, Canalith repositioning, Orthotic/Fit training, DME instructions, Dry Needling, Electrical stimulation, Spinal manipulation, Spinal mobilization, Cryotherapy, Moist heat, Taping, Traction, Ultrasound, Ionotophoresis 4mg /ml Dexamethasone , Manual therapy, and Re-evaluation.  PLAN FOR NEXT SESSION: Pt to continue with advanced HEP. Discharge to independent program at this time.    Venetia Endo, PT, DPT [phone]  Physical Therapist - [facility] 09/24/2023, 4:14 PM

## 2023-09-25 ENCOUNTER — Encounter: Payer: Self-pay | Admitting: Physical Therapy

## 2023-11-16 DIAGNOSIS — Z872 Personal history of diseases of the skin and subcutaneous tissue: Secondary | ICD-10-CM | POA: Diagnosis not present

## 2023-11-16 DIAGNOSIS — Z09 Encounter for follow-up examination after completed treatment for conditions other than malignant neoplasm: Secondary | ICD-10-CM | POA: Diagnosis not present

## 2023-11-16 DIAGNOSIS — L718 Other rosacea: Secondary | ICD-10-CM | POA: Diagnosis not present

## 2023-11-16 DIAGNOSIS — L218 Other seborrheic dermatitis: Secondary | ICD-10-CM | POA: Diagnosis not present

## 2023-11-16 DIAGNOSIS — D2271 Melanocytic nevi of right lower limb, including hip: Secondary | ICD-10-CM | POA: Diagnosis not present

## 2023-11-16 DIAGNOSIS — D225 Melanocytic nevi of trunk: Secondary | ICD-10-CM | POA: Diagnosis not present

## 2024-01-06 NOTE — Telephone Encounter (Signed)
 Copied from CRM [id number]. Topic: Appointments - Transfer of Care >> Jan 06, 2024  1:47 PM Shanda MATSU wrote: Pt is requesting to transfer FROM: Dr. Joshua Pt is requesting to transfer TO: Dr. Sol Reason for requested transfer: provider no longer there.  It is the responsibility of the team the patient would like to transfer to (Dr. Sol) to reach out to the patient if for any reason this transfer is not acceptable.

## 2024-01-15 ENCOUNTER — Ambulatory Visit
Admission: RE | Admit: 2024-01-15 | Discharge: 2024-01-15 | Disposition: A | Discharge status: Home / Self Care | Source: Ambulatory Visit

## 2024-01-15 ENCOUNTER — Ambulatory Visit (INDEPENDENT_AMBULATORY_CARE_PROVIDER_SITE_OTHER)

## 2024-01-15 VITALS — BP 159/106 | HR 68 | Temp 98.1°F | Resp 16 | Ht 65.0 in | Wt 193.8 lb

## 2024-01-15 DIAGNOSIS — J22 Unspecified acute lower respiratory infection: Secondary | ICD-10-CM

## 2024-01-15 DIAGNOSIS — R051 Acute cough: Secondary | ICD-10-CM

## 2024-01-15 DIAGNOSIS — R059 Cough, unspecified: Secondary | ICD-10-CM | POA: Diagnosis not present

## 2024-01-15 MED ORDER — BENZONATATE 100 MG PO CAPS: 200.0000 mg | ORAL_CAPSULE | Freq: Three times a day (TID) | ORAL | 0 refills | Status: DC

## 2024-01-15 MED ORDER — PROMETHAZINE-DM 6.25-15 MG/5ML PO SYRP: 5.0000 mL | ORAL_SOLUTION | Freq: Four times a day (QID) | ORAL | 0 refills | Status: DC | PRN

## 2024-01-15 MED ORDER — AZITHROMYCIN 250 MG PO TABS: 250.0000 mg | ORAL_TABLET | Freq: Every day | ORAL | 0 refills | Status: DC

## 2024-01-15 NOTE — Discharge Instructions (Addendum)
 Your chest x-ray did not show any evidence of pneumonia.  However, given that you have had symptoms for 2 weeks I do feel a trial of antibiotics is warranted.  Take the azithromycin according to the package instructions for treatment of your lower respiratory tract infection.  You can continue using plain Mucinex but I will stop using the Mucinex DM as this is raising your blood pressure.  Use the Tessalon Perles every 8 hours during the day as needed for cough.  Take them with a small sip of water.  They may give you numbness to the base of your tongue, or metallic taste in your mouth, this is normal.  Use the Promethazine  DM cough syrup at bedtime as needed for cough and congestion.  If you develop any new or worsening symptoms please return for reevaluation or follow-up with your primary care provider.

## 2024-01-15 NOTE — ED Triage Notes (Signed)
 Pt c/o cough. Started about 2 weeks ago. Denies fever. She states when she takes a deep breath she can hear crackles

## 2024-01-15 NOTE — ED Provider Notes (Signed)
 [facility]    CSN: [id number] Arrival date & time: 01/15/24  1149      History   Chief Complaint Chief Complaint  Patient presents with   Appointment   Cough    HPI Denise Bolton is a 43 y.o. female.   HPI  43 year old female with past medical history significant for iron deficiency anemia, hypertension, GERD, and depression presents for evaluation of 2 weeks worth of a cough.  She reports that the cough is intermittently productive for green sputum.  No associated fever, URI symptoms, shortness breath, or wheezing.  She has been using cough drops and took Mucinex D this morning for her symptoms.  Past Medical History:  Diagnosis Date   Anemia    prior to hysterectomy   BRCA negative 10/2014   MyRisk neg; IBIS=11.1%   Depression    Family history of ovarian cancer    MOTHER   Genetic testing of female 10/2014   IBIS=11.1%   GERD (gastroesophageal reflux disease)    Headache    migraines   Hypertension    h/o-came off meds per md instruction and changed lifestyle/diet and bp now controlled with no meds   Menorrhagia     Patient Active Problem List   Diagnosis Date Noted   Family history of ovarian cancer 03/18/2022   Atypical lobular hyperplasia [facility]) of right breast 02/06/2022   Annual physical exam 04/08/2019   Recurrent major depressive disorder, in partial remission 06/22/2017   S/P laparoscopic hysterectomy 01/11/2015   Iron deficiency anemia 08/22/2014   Palpitations 07/11/2013   Preeclampsia 07/11/2013    Past Surgical History:  Procedure Laterality Date   ABDOMINAL HYSTERECTOMY     ABDOMINOPLASTY     ABDOMINOPLASTY  2007   FATTY TISSUE REMOVED   AUGMENTATION MAMMAPLASTY Bilateral 2007   BREAST BIOPSY Right 01/07/2022   stereo bx, ribbon marker, path pending   BREAST BIOPSY Right 01/07/2022   MM RT BREAST BX W LOC DEV 1ST LESION IMAGE BX SPEC STEREO GUIDE 01/07/2022 [facility]   BREAST BIOPSY Right 02/05/2022   MM RT RADIO  FREQUENCY TAG LOC MAMMO GUIDE 02/05/2022 [facility]   BREAST LUMPECTOMY WITH RADIO FREQUENCY LOCALIZER Right 02/06/2022   Procedure: BREAST LUMPECTOMY WITH RADIO FREQUENCY LOCALIZER;  Surgeon: Jordis Laneta FALCON, MD;  Location: [facility];  Service: General;  Laterality: Right;   BREAST SURGERY     augmentation   CESAREAN SECTION     x 2   CYSTOSCOPY  01/11/2015   Procedure: CYSTOSCOPY;  Surgeon: Glory High, MD;  Location: [facility];  Service: Gynecology;;   LAPAROSCOPIC BILATERAL SALPINGECTOMY Bilateral 01/11/2015   Procedure: LAPAROSCOPIC BILATERAL SALPINGECTOMY;  Surgeon: Glory High, MD;  Location: [facility];  Service: Gynecology;  Laterality: Bilateral;   LAPAROSCOPIC HYSTERECTOMY N/A 01/11/2015   Procedure: HYSTERECTOMY TOTAL LAPAROSCOPIC;  Surgeon: Glory High, MD;  Location: [facility];  Service: Gynecology;  Laterality: N/A;    OB History     Gravida  2   Para  2   Term  2   Preterm      AB      Living  2      SAB      IAB      Ectopic      Multiple      Live Births  2            Home Medications    Prior to Admission medications   Medication Sig Start Date End Date Taking? Authorizing Provider  azithromycin (ZITHROMAX Z-PAK) 250 MG tablet Take 1 tablet (250 mg total) by mouth daily. Take 2 tablets on the first day and then 1 tablet daily thereafter for a total of 5 days of treatment. 01/15/24  Yes Bernardino Ditch, NP  benzonatate (TESSALON) 100 MG capsule Take 2 capsules (200 mg total) by mouth every 8 (eight) hours. 01/15/24  Yes Bernardino Ditch, NP  doxycycline (VIBRAMYCIN) 50 MG capsule Take 50 mg by mouth daily. 11/16/23  Yes [provider]  promethazine -dextromethorphan (PROMETHAZINE -DM) 6.25-15 MG/5ML syrup Take 5 mLs by mouth 4 (four) times daily as needed. 01/15/24  Yes Bernardino Ditch, NP  CALCIUM-MAGNESIUM-ZINC PO Take 1 tablet by mouth daily at 6 (six) AM.    [provider]    Family History Family History  Problem  Relation Age of Onset   Ovarian cancer Mother 50       DECEASED 80   Cancer Mother 49       LUNG   Melanoma Father 71   Breast cancer Neg Hx     Social History Social History   Tobacco Use   Smoking status: Former    Current packs/day: 0.00    Types: Cigarettes    Quit date: 01/02/2006    Years since quitting: 18.0   Smokeless tobacco: Never  Vaping Use   Vaping status: Never Used  Substance Use Topics   Alcohol use: Yes    Comment: 2 glasses wine every night   Drug use: No     Allergies   Patient has no known allergies.   Review of Systems Review of Systems  Constitutional:  Negative for fever.  HENT:  Negative for congestion, ear pain and rhinorrhea.   Respiratory:  Positive for cough. Negative for shortness of breath and wheezing.      Physical Exam Triage Vital Signs ED Triage Vitals  Encounter Vitals Group     BP      Girls Systolic BP Percentile      Girls Diastolic BP Percentile      Boys Systolic BP Percentile      Boys Diastolic BP Percentile      Pulse      Resp      Temp      Temp src      SpO2      Weight      Height      Head Circumference      Peak Flow      Pain Score      Pain Loc      Pain Education      Exclude from Growth Chart    No data found.  Updated Vital Signs BP (!) 159/106 (BP Location: Right Arm)   Pulse 68   Temp 98.1 F (36.7 C) (Oral)   Resp 16   Ht 5' 5 (1.651 m)   Wt 193 lb 12.6 oz (87.9 kg)   LMP 01/08/2015 (Exact Date)   SpO2 96%   BMI 32.25 kg/m   Visual Acuity Right Eye Distance:   Left Eye Distance:   Bilateral Distance:    Right Eye Near:   Left Eye Near:    Bilateral Near:     Physical Exam Vitals and nursing note reviewed.  Constitutional:      Appearance: Normal appearance. She is not ill-appearing.  HENT:     Head: Normocephalic and atraumatic.  Cardiovascular:     Rate and Rhythm: Normal rate and regular rhythm.     Pulses:  Normal pulses.     Heart sounds: Normal heart sounds.  No murmur heard.    No friction rub. No gallop.  Pulmonary:     Effort: Pulmonary effort is normal.     Breath sounds: Normal breath sounds. No wheezing, rhonchi or rales.  Skin:    General: Skin is warm and dry.     Capillary Refill: Capillary refill takes less than 2 seconds.     Findings: No rash.  Neurological:     General: No focal deficit present.     Mental Status: She is alert and oriented to person, place, and time.      UC Treatments / Results  Labs (all labs ordered are listed, but only abnormal results are displayed) Labs Reviewed - No data to display  EKG   Radiology DG Chest 2 View Result Date: 01/15/2024 EXAM: 2 VIEW(S) XRAY OF THE CHEST 01/15/2024 12:29:26 PM COMPARISON: None available. CLINICAL HISTORY: Cough x 2 weeks FINDINGS: LUNGS AND PLEURA: No focal pulmonary opacity. No pleural effusion. No pneumothorax. HEART AND MEDIASTINUM: No acute abnormality of the cardiac and mediastinal silhouettes. BONES AND SOFT TISSUES: No acute osseous abnormality. Thoracic degenerative changes. IMPRESSION: 1. No acute cardiopulmonary process. Electronically signed by: Lonni Necessary MD 01/15/2024 12:52 PM EST RP Workstation: [id number]    Procedures Procedures (including critical care time)  Medications Ordered in UC Medications - No data to display  Initial Impression / Assessment and Plan / UC Course  I have reviewed the triage vital signs and the nursing notes.  Pertinent labs & imaging results that were available during my care of the patient were reviewed by me and considered in my medical decision making (see chart for details).   Patient is a pleasant, nontoxic-appearing 43 year old female presenting for evaluation of 2 weeks worth of intermittently productive cough.  She reports that she is mostly producing sputum in the morning and then throughout the day the cough is nonproductive.  When she does bring up mucus it is greenish in color.  No associated  shortness of breath or wheezing, fever, or URI symptoms.  She does have a history of hypertension but does not take any medication.  At her last annual physical exam, per review of records in epic, her blood pressure was 138/90.  She is not on any antihypertensives at present.  She took Mucinex D this morning which most likely accounts for her elevated blood pressure of 159/106 currently.  She is not experiencing any chest pain.  I will obtain a chest x-ray to evaluate for any acute cardiopulmonary pathology.  Rest x-ray independent reviewed and evaluated by me.  Impression: No evidence of infiltrate or effusion.  Cardiomediastinal silhouette appears normal.  Radiology read is pending. Radiology impression states no acute cardiopulmonary process.  I will discharge patient home with diagnosis of lower respiratory tract infection.  Given that she has had symptoms for over 2 weeks I feel a trial of antibiotics is warranted.  I will start her on azithromycin once daily for 5 days.  Also Tessalon Perles and Promethazine  DM cough syrup for cough and congestion.   Final Clinical Impressions(s) / UC Diagnoses   Final diagnoses:  Acute cough  Lower respiratory tract infection     Discharge Instructions      Your chest x-ray did not show any evidence of pneumonia.  However, given that you have had symptoms for 2 weeks I do feel a trial of antibiotics is warranted.  Take the azithromycin according to the  package instructions for treatment of your lower respiratory tract infection.  You can continue using plain Mucinex but I will stop using the Mucinex DM as this is raising your blood pressure.  Use the Tessalon Perles every 8 hours during the day as needed for cough.  Take them with a small sip of water.  They may give you numbness to the base of your tongue, or metallic taste in your mouth, this is normal.  Use the Promethazine  DM cough syrup at bedtime as needed for cough and congestion.  If you  develop any new or worsening symptoms please return for reevaluation or follow-up with your primary care provider.     ED Prescriptions     Medication Sig Dispense Auth. Provider   azithromycin (ZITHROMAX Z-PAK) 250 MG tablet Take 1 tablet (250 mg total) by mouth daily. Take 2 tablets on the first day and then 1 tablet daily thereafter for a total of 5 days of treatment. 6 tablet Bernardino Ditch, NP   benzonatate (TESSALON) 100 MG capsule Take 2 capsules (200 mg total) by mouth every 8 (eight) hours. 21 capsule Bernardino Ditch, NP   promethazine -dextromethorphan (PROMETHAZINE -DM) 6.25-15 MG/5ML syrup Take 5 mLs by mouth 4 (four) times daily as needed. 118 mL Bernardino Ditch, NP      PDMP not reviewed this encounter.   Bernardino Ditch, NP 01/15/24 [phone]

## 2024-01-19 ENCOUNTER — Other Ambulatory Visit: Payer: Self-pay

## 2024-01-19 ENCOUNTER — Encounter: Payer: Self-pay | Admitting: Family Medicine

## 2024-01-19 ENCOUNTER — Ambulatory Visit: Admitting: Family Medicine

## 2024-01-19 ENCOUNTER — Other Ambulatory Visit (HOSPITAL_COMMUNITY): Payer: Self-pay

## 2024-01-19 ENCOUNTER — Other Ambulatory Visit: Payer: Self-pay | Admitting: Family Medicine

## 2024-01-19 VITALS — BP 154/100 | HR 91 | Ht 65.0 in | Wt 193.0 lb

## 2024-01-19 DIAGNOSIS — W460XXA Contact with hypodermic needle, initial encounter: Secondary | ICD-10-CM

## 2024-01-19 DIAGNOSIS — I10 Essential (primary) hypertension: Secondary | ICD-10-CM

## 2024-01-19 MED ORDER — DOLUTEGRAVIR SODIUM 50 MG PO TABS: 50.0000 mg | ORAL_TABLET | Freq: Every day | ORAL | 0 refills | Status: DC

## 2024-01-19 MED ORDER — DOLUTEGRAVIR SODIUM 50 MG PO TABS
50.0000 mg | ORAL_TABLET | Freq: Every day | ORAL | 0 refills | Status: DC
  Filled 2024-01-19 (×3): qty 30, 30d supply, fill #0

## 2024-01-19 MED ORDER — OLMESARTAN MEDOXOMIL 20 MG PO TABS: 10.0000 mg | ORAL_TABLET | Freq: Every day | ORAL | 0 refills | Status: DC

## 2024-01-19 MED ORDER — EMTRICITABINE-TENOFOVIR DF 200-300 MG PO TABS: 1.0000 | ORAL_TABLET | Freq: Every day | ORAL | 0 refills | Status: DC

## 2024-01-19 MED ORDER — EMTRICITABINE-TENOFOVIR DF 200-300 MG PO TABS
1.0000 | ORAL_TABLET | Freq: Every day | ORAL | 0 refills | Status: AC
  Filled 2024-01-19 (×4): qty 30, 30d supply, fill #0

## 2024-01-19 NOTE — Addendum Note (Signed)
 Addended by: SOL MACKEY VON MARLA on: 01/19/2024 02:48 PM   Modules accepted: Orders

## 2024-01-19 NOTE — Progress Notes (Signed)
 Established Patient Office Visit  Patient ID: Denise Bolton, female    DOB: [birth year]  Age: 43 y.o. MRN: [id number] PCP: Rosary Filosa K, MD  Chief Complaint  Patient presents with   Establish Care    Subjective:     HPI  Discussed the use of AI scribe software for clinical note transcription with the patient, who gave verbal consent to proceed.  History of Present Illness Denise ELDREDGE is a 43 year old female with hypertension who presents with elevated blood pressure readings and a recent needle stick injury.  She has a history of hypertension that was initially well-controlled with medication, diet, and exercise. Over the past year, she has not maintained these lifestyle changes, leading to elevated blood pressure readings. Her most recent reading was 160/100 mmHg, taken at an urgent care visit last week. Her blood pressure has been consistently high during recent checks. She was previously on lisinopril  at a low dose for about a year and a half, which effectively controlled her blood pressure. After achieving good control, she discontinued the medication under medical guidance. Her physical activity has decreased due to a change in her job role, which is now more sedentary. No current headaches, which she associates with high blood pressure.  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 The incident occurred less than 48 hours ago. She has no known blood return from the injury and washed her hands immediately after the incident.  Her past medical history includes a lumpectomy on the right breast, which was non-cancerous, and a total hysterectomy. She has a family history of ovarian cancer in her mother and melanoma on her father's side. She undergoes regular dermatological checks and has had precancerous lesions removed. No current swelling in her  legs.    Review of Systems  All other systems reviewed and are negative.     Objective:     BP (!) 154/100   Pulse 91   Ht 5' 5 (1.651 m)   Wt 193 lb (87.5 kg)   LMP 01/08/2015 (Exact Date)   SpO2 97%   BMI 32.12 kg/m  BP Readings from Last 3 Encounters:  01/19/24 (!) 154/100  01/15/24 (!) 159/106  06/09/23 (!) 138/90      Physical Exam Vitals and nursing note reviewed.  Constitutional:      Appearance: Normal appearance.  HENT:     Head: Normocephalic.     Right Ear: External ear normal.     Left Ear: External ear normal.  Eyes:     Conjunctiva/sclera: Conjunctivae normal.  Cardiovascular:     Rate and Rhythm: Normal rate.  Pulmonary:     Effort: Pulmonary effort is normal. No respiratory distress.  Abdominal:     Palpations: Abdomen is soft.  Musculoskeletal:        General: Normal range of motion.  Skin:    General: Skin is warm.  Neurological:     Mental Status: She is alert and oriented to person, place, and time.  Psychiatric:        Mood and Affect: Mood normal.     Physical Exam     No results found for any visits on 01/19/24.     The 10-year ASCVD risk score (Arnett DK, et al., 2019) is: 0.9%    Assessment & Plan:   Problem List Items Addressed This Visit   None   Assessment and Plan  Assessment & Plan Essential hypertension Discussed olmesartan  benefits over lisinopril , including reduced dry cough risk. Considered combination therapy but she prefers monotherapy. - Prescribed olmesartan  monotherapy. - Scheduled follow-up in 4 weeks to assess blood pressure control and consider medication adjustment. - Advised on lifestyle modifications including diet, exercise, and hydration.  Percutaneous exposure to contaminated dental instrument (needle stick) Recent needle stick injury with potential exposure to HIV, hepatitis B, and hepatitis C. Discussed post-exposure prophylaxis options, including Truvada  and Tivicay  for HIV, within the  72-hour window. - Ordered HIV, hepatitis B, and hepatitis C antibody tests. - Prescribed post-exposure prophylaxis for 30 days. - Advised to monitor for symptoms and follow up with test results.  General Health Maintenance Routine health maintenance discussed, including mammogram and colonoscopy screenings. History of lumpectomy and hysterectomy, no current need for Pap smears. Discussed regular screenings and potential insurance coverage issues with Cologuard and colonoscopy. - Schedule mammogram as per routine screening guidelines. - Consider colonoscopy or Cologuard for colorectal cancer screening, with attention to insurance coverage. - Continue regular dermatology visits for skin checks due to family history of melanoma.    No follow-ups on file.    Vinary K Amarria Andreasen, MD [facility] at [facility]

## 2024-01-19 NOTE — Progress Notes (Signed)
 Patient using for PEP. Per Janita no re-write needed due to time sensitivity. Dis-enrolling.

## 2024-01-20 ENCOUNTER — Ambulatory Visit: Payer: Self-pay | Admitting: Family Medicine

## 2024-01-20 LAB — HEPATITIS B SURFACE ANTIBODY,QUALITATIVE: Hep B Surface Ab, Qual: REACTIVE

## 2024-01-20 LAB — HIV ANTIBODY (ROUTINE TESTING W REFLEX): HIV Screen 4th Generation wRfx: NONREACTIVE

## 2024-01-20 LAB — HEPATITIS B SURFACE ANTIGEN: Hepatitis B Surface Ag: NEGATIVE

## 2024-01-20 LAB — HEPATITIS C ANTIBODY: Hep C Virus Ab: NONREACTIVE

## 2024-01-25 ENCOUNTER — Other Ambulatory Visit (HOSPITAL_COMMUNITY): Payer: Self-pay

## 2024-01-25 ENCOUNTER — Other Ambulatory Visit: Payer: Self-pay

## 2024-02-10 ENCOUNTER — Other Ambulatory Visit: Payer: Self-pay | Admitting: Family Medicine

## 2024-02-10 DIAGNOSIS — I10 Essential (primary) hypertension: Secondary | ICD-10-CM

## 2024-02-11 NOTE — Telephone Encounter (Signed)
 Requested medications are due for refill today.  yes  Requested medications are on the active medications list.  yes  Last refill. 01/19/2024 #15 0 rf  Future visit scheduled.   yes  Notes to clinic.  Labs are expired.    Requested Prescriptions  Pending Prescriptions Disp Refills   olmesartan  (BENICAR ) 20 MG tablet [Pharmacy Med Name: OLMESARTAN  MEDOXOMIL 20 MG TAB] 45 tablet 1    Sig: TAKE 1/2 TABLET BY MOUTH DAILY     Cardiovascular:  Angiotensin Receptor Blockers Failed - 02/11/2024  5:13 PM      Failed - Cr in normal range and within 180 days    Creatinine, Ser  Date Value Ref Range Status  06/09/2023 0.67 0.57 - 1.00 mg/dL Final         Failed - K in normal range and within 180 days    Potassium  Date Value Ref Range Status  06/09/2023 5.1 3.5 - 5.2 mmol/L Final         Failed - Last BP in normal range    BP Readings from Last 1 Encounters:  01/19/24 (!) 154/100         Passed - Patient is not pregnant      Passed - Valid encounter within last 6 months    Recent Outpatient Visits           3 weeks ago Essential hypertension   [place] [facility] at [facility] Kotturi, Vinay K, MD   8 months ago Annual physical exam   [facility] at [facility] Cathryne JAYSON, MD

## 2024-02-15 DIAGNOSIS — L719 Rosacea, unspecified: Secondary | ICD-10-CM | POA: Diagnosis not present

## 2024-02-16 ENCOUNTER — Encounter: Payer: Self-pay | Admitting: Family Medicine

## 2024-02-16 ENCOUNTER — Ambulatory Visit: Admitting: Family Medicine

## 2024-02-16 VITALS — BP 124/78 | HR 85 | Ht 65.0 in | Wt 197.0 lb

## 2024-02-16 DIAGNOSIS — I10 Essential (primary) hypertension: Secondary | ICD-10-CM

## 2024-02-16 DIAGNOSIS — W460XXA Contact with hypodermic needle, initial encounter: Secondary | ICD-10-CM

## 2024-02-16 MED ORDER — OLMESARTAN MEDOXOMIL 20 MG PO TABS: 10.0000 mg | ORAL_TABLET | Freq: Every day | ORAL | 1 refills | Status: AC

## 2024-02-16 NOTE — Progress Notes (Signed)
 Established Patient Office Visit  Patient ID: Denise Bolton, female    DOB: 11/27/1980  Age: 43 y.o. MRN: [id number] PCP: Jatziry Wechter K, MD  Chief Complaint  Patient presents with   Hypertension    Subjective:     HPI  Discussed the use of AI scribe software for clinical note transcription with the patient, who gave verbal consent to proceed.  History of Present Illness Denise Bolton is a 43 year old female with hypertension who presents for a blood pressure follow-up.  She is currently on antihypertensive medication and has two days remaining on her current prescription.  She inquires about the schedule for follow-up blood work, recalling that it was previously done at three and six months intervals. She has a history of percutaneous exposure to a contaminated dental instrument, recalling the protocol from a previous exposure at work, which included immediate follow-up and subsequent blood work at three and six months. She is uncertain about the current protocol and requires confirmation.  She works with [employer], indicating a possible occupational exposure or requirement for health monitoring.      Review of Systems  All other systems reviewed and are negative.     Objective:     BP 124/78   Pulse 85   Ht 5' 5 (1.651 m)   Wt 197 lb (89.4 kg)   LMP 12/11/2014   SpO2 98%   BMI 32.78 kg/m  BP Readings from Last 3 Encounters:  02/16/24 124/78  01/19/24 (!) 154/100  01/15/24 (!) 159/106   Wt Readings from Last 3 Encounters:  02/16/24 197 lb (89.4 kg)  01/19/24 193 lb (87.5 kg)  01/15/24 193 lb 12.6 oz (87.9 kg)      Physical Exam Vitals and nursing note reviewed.  Constitutional:      Appearance: Normal appearance.  HENT:     Head: Normocephalic.     Right Ear: External ear normal.     Left Ear: External ear normal.  Eyes:     Conjunctiva/sclera: Conjunctivae normal.  Cardiovascular:     Rate and Rhythm: Normal rate.   Pulmonary:     Effort: Pulmonary effort is normal. No respiratory distress.  Abdominal:     Palpations: Abdomen is soft.  Musculoskeletal:        General: Normal range of motion.  Skin:    General: Skin is warm.  Neurological:     Mental Status: She is alert and oriented to person, place, and time.  Psychiatric:        Mood and Affect: Mood normal.     Physical Exam     No results found for any visits on 02/16/24.     The 10-year ASCVD risk score (Arnett DK, et al., 2019) is: 0.8%    Assessment & Plan:   Problem List Items Addressed This Visit   None Visit Diagnoses       Accident caused by hypodermic needle, initial encounter    -  Primary   Relevant Orders   HIV Antibody (routine testing w rflx)     Essential hypertension       Relevant Medications   olmesartan  (BENICAR ) 20 MG tablet       Assessment and Plan Assessment & Plan Essential hypertension Blood pressure well-controlled with current medication. - Continue current antihypertensive medication regimen.  Percutaneous exposure to contaminated dental instrument Two days remaining in treatment regimen. - Complete current treatment regimen in two days. - HIV Retesting 4 -  6 weeks and 3 month.   No follow-ups on file.    [name], MD [facility] at [facility]

## 2024-02-22 ENCOUNTER — Other Ambulatory Visit: Payer: Self-pay

## 2024-02-22 MED ORDER — DOLUTEGRAVIR SODIUM 50 MG PO TABS: 50.0000 mg | ORAL_TABLET | Freq: Every day | ORAL | 0 refills | Status: AC

## 2024-02-22 MED ORDER — OLMESARTAN MEDOXOMIL 20 MG PO TABS
10.0000 mg | ORAL_TABLET | Freq: Every day | ORAL | 1 refills | Status: AC
  Filled 2024-02-22: qty 45, 90d supply, fill #0

## 2024-02-22 MED ORDER — EMTRICITABINE-TENOFOVIR AF 200-25 MG PO TABS: 1.0000 | ORAL_TABLET | Freq: Every day | ORAL | 0 refills | Status: AC

## 2024-03-03 LAB — HIV ANTIBODY (ROUTINE TESTING W REFLEX): HIV Screen 4th Generation wRfx: NONREACTIVE

## 2024-03-04 ENCOUNTER — Ambulatory Visit: Payer: Self-pay | Admitting: Family Medicine

## 2024-09-20 ENCOUNTER — Encounter: Admitting: Family Medicine
